# Patient Record
Sex: Male | Born: 1970 | Race: Black or African American | Hispanic: No | Marital: Married | Smoking: Former smoker
Health system: Southern US, Community
[De-identification: ages and names within clinical notes are randomized; demographics above are authoritative.]

## PROBLEM LIST (undated history)

## (undated) DIAGNOSIS — J439 Emphysema, unspecified: Secondary | ICD-10-CM

## (undated) DIAGNOSIS — Z720 Tobacco use: Secondary | ICD-10-CM

## (undated) DIAGNOSIS — K219 Gastro-esophageal reflux disease without esophagitis: Secondary | ICD-10-CM

## (undated) DIAGNOSIS — N529 Male erectile dysfunction, unspecified: Secondary | ICD-10-CM

## (undated) DIAGNOSIS — F99 Mental disorder, not otherwise specified: Secondary | ICD-10-CM

## (undated) DIAGNOSIS — M2550 Pain in unspecified joint: Secondary | ICD-10-CM

## (undated) DIAGNOSIS — R7989 Other specified abnormal findings of blood chemistry: Secondary | ICD-10-CM

## (undated) DIAGNOSIS — J984 Other disorders of lung: Secondary | ICD-10-CM

## (undated) DIAGNOSIS — Z0000 Encounter for general adult medical examination without abnormal findings: Secondary | ICD-10-CM

## (undated) DIAGNOSIS — D571 Sickle-cell disease without crisis: Secondary | ICD-10-CM

## (undated) DIAGNOSIS — M87052 Idiopathic aseptic necrosis of left femur: Secondary | ICD-10-CM

## (undated) DIAGNOSIS — M1990 Unspecified osteoarthritis, unspecified site: Secondary | ICD-10-CM

## (undated) HISTORY — DX: Other specified abnormal findings of blood chemistry: R79.89

## (undated) HISTORY — DX: Idiopathic aseptic necrosis of left femur: M87.052

## (undated) HISTORY — DX: Pain in unspecified joint: M25.50

## (undated) HISTORY — DX: Gastro-esophageal reflux disease without esophagitis: K21.9

## (undated) HISTORY — DX: Unspecified osteoarthritis, unspecified site: M19.90

## (undated) HISTORY — DX: Male erectile dysfunction, unspecified: N52.9

## (undated) HISTORY — DX: Other disorders of lung: J98.4

## (undated) HISTORY — DX: Tobacco use: Z72.0

## (undated) HISTORY — DX: Emphysema, unspecified: J43.9

## (undated) HISTORY — DX: Encounter for general adult medical examination without abnormal findings: Z00.00

## (undated) HISTORY — DX: Sickle-cell disease without crisis: D57.1

## (undated) HISTORY — DX: Mental disorder, not otherwise specified: F99

---

## 1998-11-16 ENCOUNTER — Encounter: Payer: Self-pay | Admitting: Emergency Medicine

## 1998-11-16 ENCOUNTER — Emergency Department (HOSPITAL_COMMUNITY): Admission: EM | Admit: 1998-11-16 | Discharge: 1998-11-16 | Payer: Self-pay | Admitting: Emergency Medicine

## 2002-08-15 ENCOUNTER — Encounter: Payer: Self-pay | Admitting: Family Medicine

## 2002-08-15 ENCOUNTER — Encounter: Admission: RE | Admit: 2002-08-15 | Discharge: 2002-08-15 | Payer: Self-pay | Admitting: Family Medicine

## 2002-09-17 ENCOUNTER — Encounter (INDEPENDENT_AMBULATORY_CARE_PROVIDER_SITE_OTHER): Payer: Self-pay | Admitting: Cardiology

## 2002-09-17 ENCOUNTER — Ambulatory Visit: Admission: RE | Admit: 2002-09-17 | Discharge: 2002-09-17 | Payer: Self-pay | Admitting: Family Medicine

## 2002-11-22 ENCOUNTER — Encounter: Payer: Self-pay | Admitting: Family Medicine

## 2002-11-22 ENCOUNTER — Encounter: Admission: RE | Admit: 2002-11-22 | Discharge: 2002-11-22 | Payer: Self-pay | Admitting: Family Medicine

## 2002-12-27 ENCOUNTER — Ambulatory Visit (HOSPITAL_COMMUNITY): Admission: RE | Admit: 2002-12-27 | Discharge: 2002-12-27 | Payer: Self-pay | Admitting: Oncology

## 2002-12-27 ENCOUNTER — Encounter: Payer: Self-pay | Admitting: Oncology

## 2004-09-30 ENCOUNTER — Ambulatory Visit: Payer: Self-pay | Admitting: Oncology

## 2005-01-15 ENCOUNTER — Emergency Department (HOSPITAL_COMMUNITY): Admission: EM | Admit: 2005-01-15 | Discharge: 2005-01-15 | Payer: Self-pay | Admitting: Emergency Medicine

## 2005-11-23 ENCOUNTER — Encounter: Admission: RE | Admit: 2005-11-23 | Discharge: 2005-11-23 | Payer: Self-pay | Admitting: Family Medicine

## 2007-07-17 ENCOUNTER — Inpatient Hospital Stay (HOSPITAL_COMMUNITY): Admission: EM | Admit: 2007-07-17 | Discharge: 2007-07-26 | Payer: Self-pay | Admitting: Emergency Medicine

## 2007-07-18 ENCOUNTER — Ambulatory Visit: Payer: Self-pay | Admitting: Infectious Diseases

## 2008-08-18 IMAGING — CT CT ABDOMEN W/ CM
1 of 2 series · 14 of 32 positions shown, 18 images · IV contrast (omnipaque)
Comparison: Frontal chest 07/19/2007. No prior CTs.

CLINICAL DATA: right-sided shoulder and chest pain. Shortness of breath.
Abdominal pain. Fever. Sickle cell trait.
TECHNIQUE: Multidetector CT imaging of the chest, abdomen, and pelvis was
performed following the standard protocol during bolus administration of
intravenous contrast.

Contrast:  100 cc Omnipaque 300
CHEST CT WITH CONTRAST

[Series 2: cap 5.0 b40f st · axial · 0.67mm/px · z∈[-537,-7]mm · 14 of 116 slices shown, 18 images]
[im 5/116  soft-tissue]
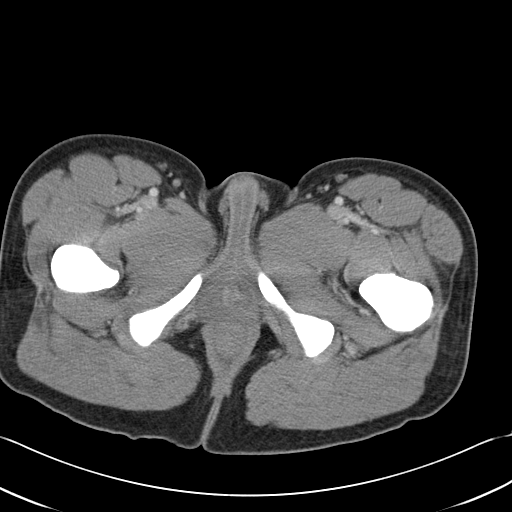
[im 5/116  bone]
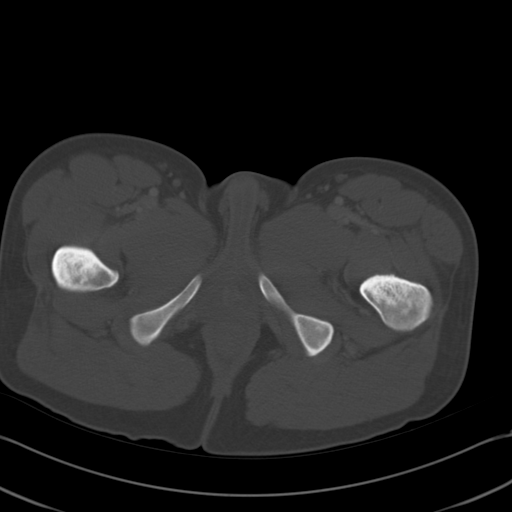
[im 15/116  soft-tissue]
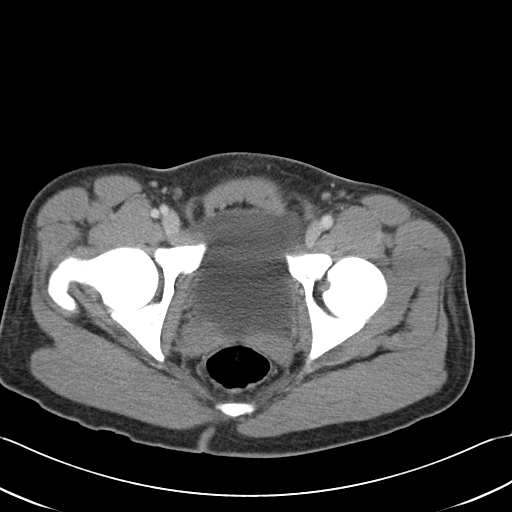
[im 24/116  soft-tissue]
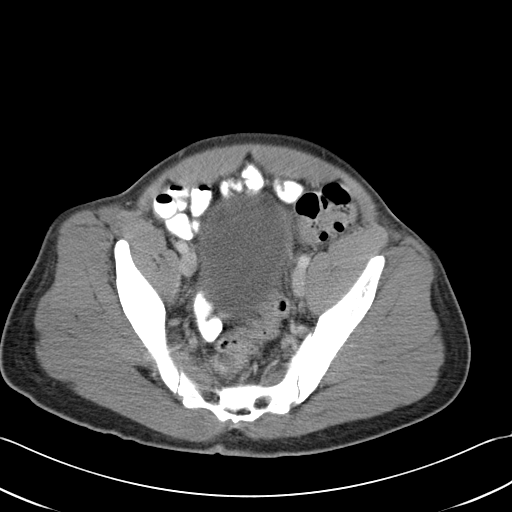
[im 34/116  soft-tissue]
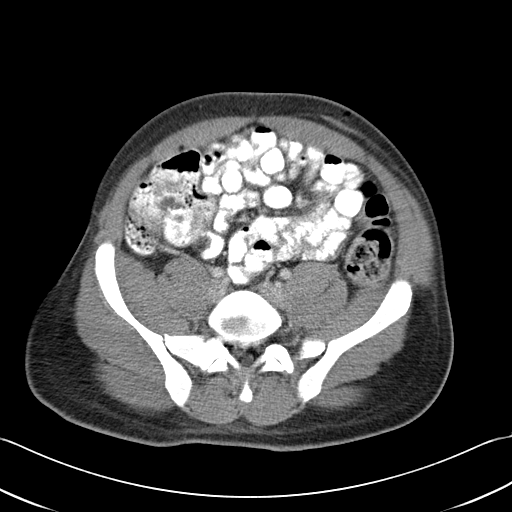
[im 44/116  soft-tissue]
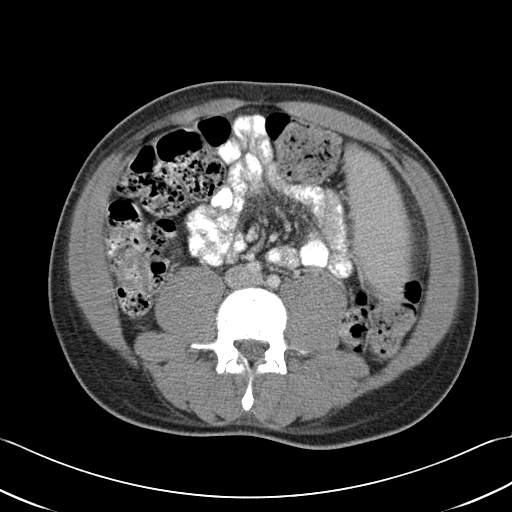
[im 53/116  soft-tissue]
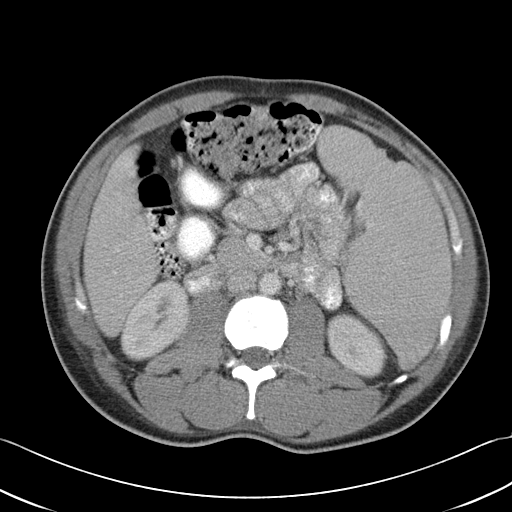
[im 63/116  soft-tissue]
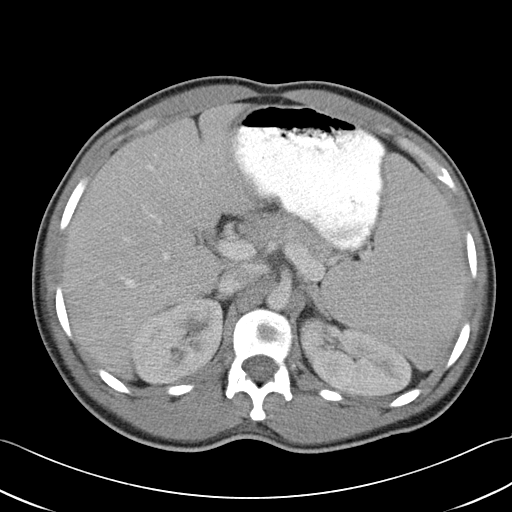
[im 72/116  soft-tissue]
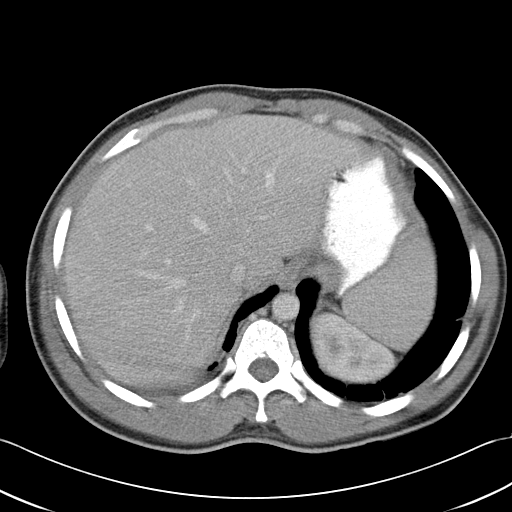
[im 82/116  soft-tissue]
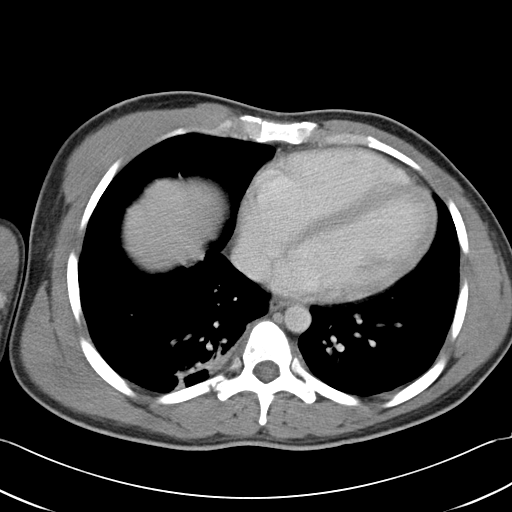
[im 82/116  bone]
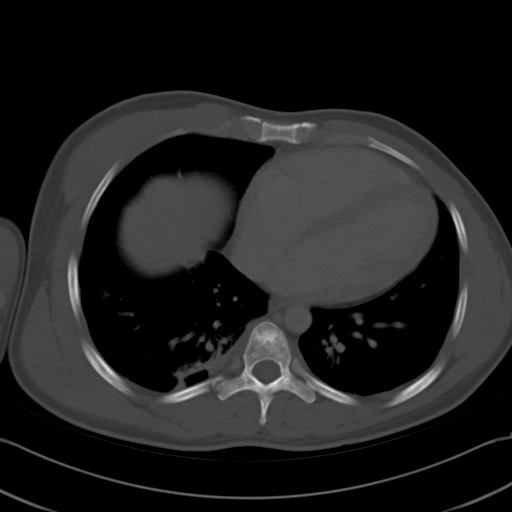
[im 92/116  soft-tissue]
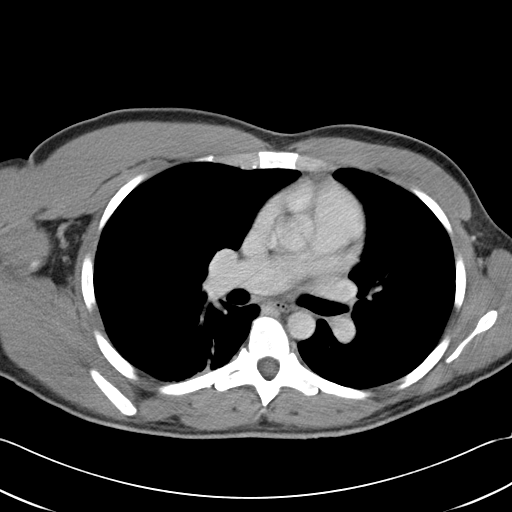
[im 96/116  lung]
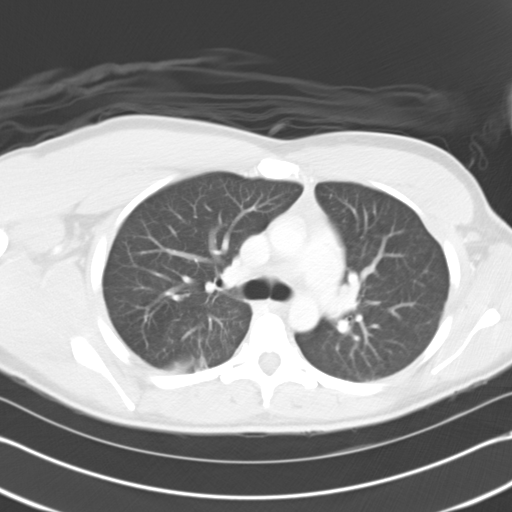
[im 101/116  soft-tissue]
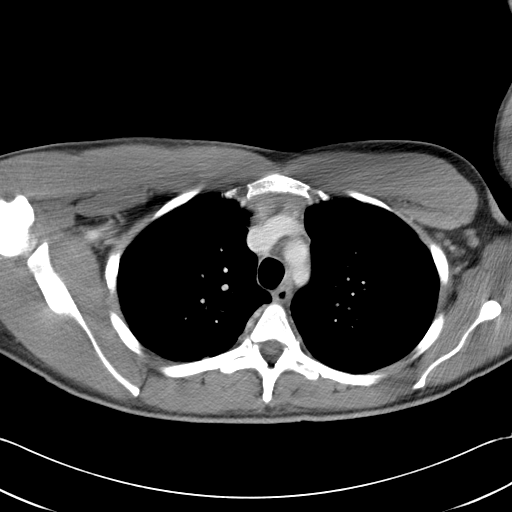
[im 101/116  lung]
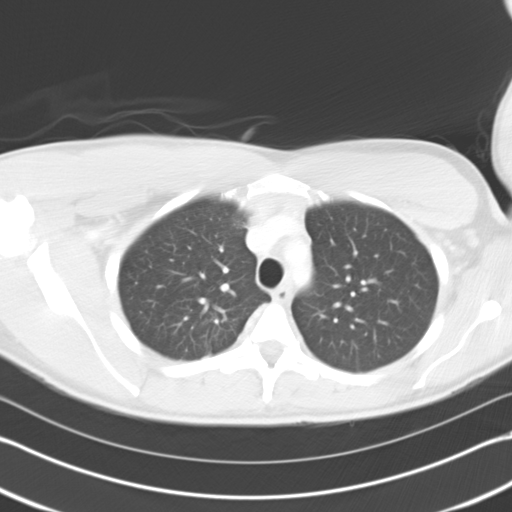
[im 106/116  lung]
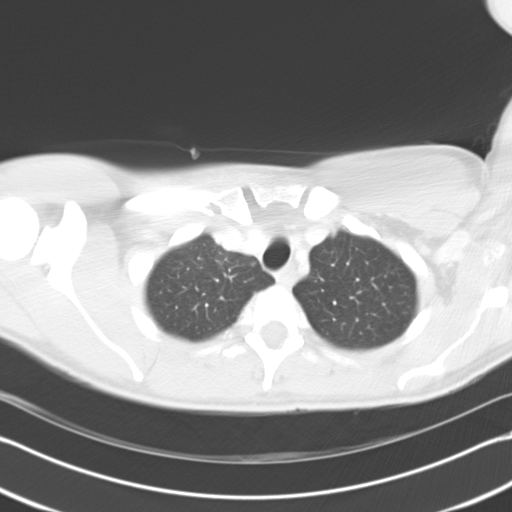
[im 111/116  soft-tissue]
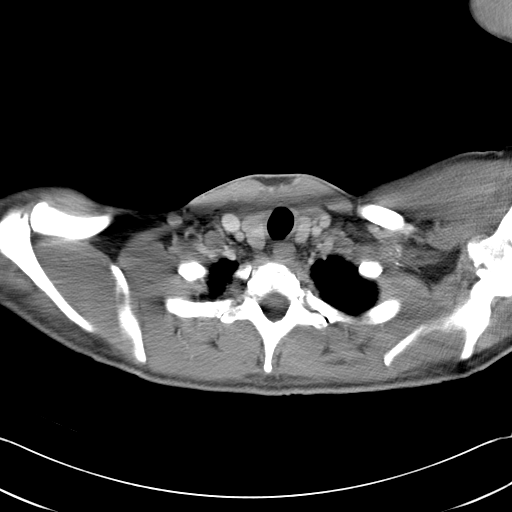
[im 111/116  lung]
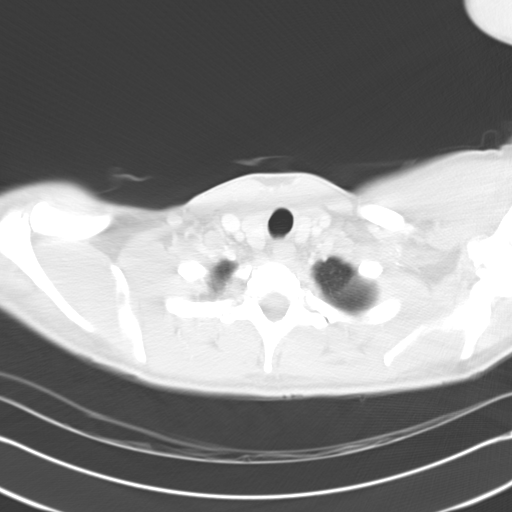

[14 of 32 positions shown; findings below may reference images not displayed]

FINDINGS: Lung windows demonstrate mild airspace disease in dependent right
lower lobe on image 41 most likely related to atelectasis.

Minimal left base atelectasis. 

Soft tissue windows demonstrate that the study was not performed to evaluate for
pulmonary embolism. No gross central embolism is identified.

Right arm is not raised above the head. Mild motion artifact involves the mid
chest. Moderate cardiomegaly without pericardial effusion. Small right-sided
pleural effusion. No mediastinal adenopathy. Mildly prominent right hilar nodal
tissue on image 29 without adenopathy.

Bone windows demonstrate age advanced joint space narrowing involving the left
glenohumeral joint This likely related to either remote trauma or possibly the
sequela of avascular necrosis. Multifocal areas of thoracic spine sclerosis are
suspicious for bone infarcts. There is probable avascular necrosis of the right
humeral head is well.

IMPRESSION

1. Mild right base airspace disease with small right-sided pleural effusion. The
airspace disease is likely related to atelectasis.
2. Moderate cardiomegaly.
3. Osseous sclerosis throughout the thorax most likely related to multiple
infarcts. Please see discussion above.

ABDOMEN CT WITH CONTRAST
FINDINGS: Hepatomegaly 18.4 cm. Massive splenomegaly at 15.6 x 16.1 x 6.9 cm.

Normal stomach, pancreas, gallbladder, adrenal glands, and right kidney. Left
upper pole renal lesion, a cyst.

No retroperitoneal or retrocrural adenopathy.

Moderate stool throughout the colon suggest constipation. Normal terminal ileum.
Appendix not well seen. No right lower quadrant inflammation. No ascites

IMPRESSION

1. Mild hepatomegaly and marked splenomegaly.
2. Otherwise no acute abdominal process.
3. Probable constipation.

PELVIS CT WITH CONTRAST
FINDINGS: No free pelvic fluid. Pelvic loops normal. No adenopathy. Urinary
bladder prostate normal. Bone windows demonstrate extensive sclerosis consistent
with multifocal infarcts. Avascular necrosis of bilateral femoral heads.

IMPRESSION

1. No acute pelvic process.
2.  Extensive bony sclerosis about the pelvis and spine. This is presumed
related related to multifocal infarctions in this patient with sickle cell.
Correlate with any history of malignancy, as concurrent sclerotic metastasis
would be difficult to exclude.

## 2010-02-18 DIAGNOSIS — Z0000 Encounter for general adult medical examination without abnormal findings: Secondary | ICD-10-CM

## 2010-02-18 HISTORY — DX: Encounter for general adult medical examination without abnormal findings: Z00.00

## 2010-08-09 ENCOUNTER — Ambulatory Visit (HOSPITAL_COMMUNITY): Admission: RE | Admit: 2010-08-09 | Discharge: 2010-08-09 | Payer: Self-pay | Admitting: Internal Medicine

## 2010-11-18 HISTORY — PX: UPPER GASTROINTESTINAL ENDOSCOPY: SHX188

## 2011-01-31 HISTORY — PX: ELECTROCARDIOGRAM: SHX264

## 2011-03-02 ENCOUNTER — Encounter: Payer: Self-pay | Admitting: Internal Medicine

## 2011-03-02 DIAGNOSIS — K2990 Gastroduodenitis, unspecified, without bleeding: Secondary | ICD-10-CM | POA: Insufficient documentation

## 2011-03-02 DIAGNOSIS — N529 Male erectile dysfunction, unspecified: Secondary | ICD-10-CM | POA: Insufficient documentation

## 2011-03-02 DIAGNOSIS — M1990 Unspecified osteoarthritis, unspecified site: Secondary | ICD-10-CM | POA: Insufficient documentation

## 2011-03-02 DIAGNOSIS — M2550 Pain in unspecified joint: Secondary | ICD-10-CM | POA: Insufficient documentation

## 2011-04-12 NOTE — Discharge Summary (Signed)
[name], [name]            ACCOUNT NO.:  [id number]   MEDICAL RECORD NO.:  [id number]          PATIENT TYPE:  INP   LOCATION:  1329                         FACILITY:  [facility]   PHYSICIAN:  Beckey Rutter, MD  DATE OF BIRTH:  August 11, 1971   DATE OF ADMISSION:  07/16/2007  DATE OF DISCHARGE:                               DISCHARGE SUMMARY   INTERIM DISCHARGE SUMMARY:   PRIMARY CARE PHYSICIAN:  Eric L. August Saucer, M.D.   CHIEF COMPLAINT ON PRESENTATION:  Pain.   HISTORY OF PRESENT ILLNESS:  Please refer to the H&P dictated on the day  of admission by Thomasenia Bottoms, MD.   HOSPITAL COURSE:  During hospitalization the patient received pain  control for his sickle cell pain crisis.  The patient was developing  fever and because he patient has recent history of travel to [place], the  patient was seen and evaluated by infectious disease for the possibility  of malaria or other infectious pathogen.  The patient's malaria film was  negative and it was felt that his fever is just secondary to his sickle  cell crisis.   Sickle cell problem:  The patient has apparently sequestration crisis  with huge spleen.  The patient received blood transfusion more than  three times but now his hemoglobin is stable.  We will continue to  monitor his H&H for the days to come.  The patient was seen by Dr. Willey Blade during hospitalization for sickle cell management.   DISCHARGE DIAGNOSES:  1. 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  2. Fever with negative blood cultures, felt it is secondary to sickle      cell disease.   DISCHARGE MEDICATIONS:  This will be dictated on the day of actual  discharge.   PLAN:  The plan is to continue to monitor his H&H and   Dictation ended at this point.      Beckey Rutter, MD  Electronically Signed     EME/MEDQ  D:  07/24/2007  T:  07/25/2007  Job:  [id number]

## 2011-04-12 NOTE — H&P (Signed)
NAMEVENUS, [name]            ACCOUNT NO.:  [id number]   MEDICAL RECORD NO.:  [id number]          PATIENT TYPE:  EMS   LOCATION:  ED                           [facility]ACILITY[facility]  [facility]   PHYSICIAN:  Thomasenia Bottoms, MDDATE OF BIRTH:  December 13, 1970   DATE OF ADMISSION:  07/16/2007  DATE OF DISCHARGE:                              HISTORY & PHYSICAL   CHIEF COMPLAINT:  Pain.   HISTORY OF PRESENT ILLNESS:  [name] is a 40 year old with sickle  cell anemia who presents today with pain in his lower back and abdomen.  The patient traveled over seas.  He got back yesterday.  The pain  started earlier this morning when he woke up at about 11:00 a.m.  He  says he has pain in this region when he has his crises, but this is just  worse than usual.  Generally the patient can take outpatient medications  and will be fine after about a week or so.  This pain was so severe he  had to come to the emergency department.  He generally has mild pain  crises.  The last time he had a very severe crisis like this was over 5  years ago.   PAST MEDICAL HISTORY:  [place] sickle cell disease.  No other medical  problems.   PAST SURGICAL HISTORY:  No history of surgeries.   MEDICATIONS ON ARRIVAL:  Just p.r.n. pain medication.   SOCIAL HISTORY:  He does smoke occasionally.  He does drink  occasionally.   FAMILY HISTORY:  Significant for no history of cancer or sickle cell  anemia in other family members that he is aware of.   REVIEW OF SYSTEMS:  CONSTITUTIONAL:  The patient denies any significant  headaches.  No fevers.  No night sweats.  HEENT:  No double vision.  No  sinus trouble.  No sore throat.  No headaches.  CARDIOVASCULAR:  No  chest pain.  No lower extremity edema.  RESPIRATORY:  No hemoptysis.  No  cough.  No shortness of breath.  GI:  He is having the pain but no  diarrhea.  No nausea, no vomiting.  No bright red blood per rectum.  He  has not vomited any blood.  GU:  No hematuria.   MUSCULOSKELETAL:  He is  having pain in his muscles, mostly in his abdomen area and back, as  mentioned above.  NEUROLOGIC:  No asymmetric weakness or paresthesias.  The patient is ambulatory and independent of his activities of daily  living.  No slurred speech.  No seizures.  INTEGUMENTARY:  No rashes or  open lesions.   PHYSICAL EXAMINATION:  VITAL SIGNS:  On arrival, blood pressure 147/82,  pulse 91, respiratory rate 22, saturating 100% on room air.  GENERAL:  The patient is a young gentleman, well-nourished, well-  developed.  HEENT:  Normocephalic, atraumatic.  His pupils are equal and round.  The  sclera are nonicteric.  Oral mucosa moist.  Oropharynx within normal  limits.  NECK:  Supple.  No lymphadenopathy, no thyromegaly, no jugular venous  distension.  CARDIAC:  He is somewhat tachycardiac but regular.  LUNGS:  Clear to auscultation bilaterally.  No wheezes, rhonchi, or  rales.  ABDOMEN:  Soft.  He seems to be guarding somewhat, but he does have  bowel sounds.  He denies tenderness even to deep palpation.  I am unable  to appreciate any masses.  EXTREMITIES:  No evidence of clubbing, cyanosis, or edema.  NEUROLOGIC:  He is alert and oriented.  He is slightly drowsy, likely  from he pain medication he just received, but he does answer all my  questions appropriately and does follow commands.  He has 5/5 strength  in each of his extremities with no focal motor deficit.  SKIN:  Intact  grossly, though sacral area was not examined.   LABORATORY DATA:  White count of 9.8, hemoglobin 9.8, hematocrit 28.6,  platelet count 197.  Urinalysis is negative for leukocyte esterase and  essentially unremarkable.  Sodium is 135, potassium 3.7, chloride 101,  bicarb 26, glucose 149, BUN 10, creatinine 0.97.  AST is 41, ALT is 20.  Total bilirubin is not reported on the sheet.   Chest x-ray reveals borderline cardiomegaly with mild bronchitic  changes.  His EKG reveals normal sinus  rhythm with a rate of 92.  He has  some nonspecific T-wave abnormalities.   ASSESSMENT AND PLAN:  Young gentleman with a sickle cell crisis.  Will  admit him to the hospital, put him on IV fluids, and provide pain  medicine.  Certainly if his pain worsens, would consider doing  additional studies to rule out other causes.  The patient's abdominal  exam is relatively benign at this point.  His laboratory tests are  relatively unremarkable, and he does report that this is similar to his  crises in the past, so we will monitor, with routine sickle cell  admission orders, including IV fluids and pain medication.      Thomasenia Bottoms, MD  Electronically Signed     CVC/MEDQ  D:  07/17/2007  T:  07/17/2007  Job:  [id number]   cc:   Renaye Rakers, M.D.  Fax: [phone]

## 2011-04-15 NOTE — Discharge Summary (Signed)
NAMEJEREMI, [name]            ACCOUNT NO.:  [id number]   MEDICAL RECORD NO.:  [id number]          PATIENT TYPE:  INP   LOCATION:  1329                         FACILITY:  [facility]   PHYSICIAN:  Beckey Rutter, MD  DATE OF BIRTH:  July 11, 1971   DATE OF ADMISSION:  07/16/2007  DATE OF DISCHARGE:  07/26/2007                               DISCHARGE SUMMARY   ADDENDUM:   DISCHARGE MEDICATIONS:  1. Folic acid daily.  2. Tylenol p.r.n.   The patient was planned to follow up with Dr. August Saucer as discussed and  documented on in the chart for discharge.      Beckey Rutter, MD  Electronically Signed     EME/MEDQ  D:  08/21/2007  T:  08/21/2007  Job:  [id number]

## 2011-09-09 LAB — CBC
HCT: 22.3 — ABNORMAL LOW
HCT: 22.8 — ABNORMAL LOW
HCT: 25.4 — ABNORMAL LOW
HCT: 27.8 — ABNORMAL LOW
HCT: 28.6 — ABNORMAL LOW
HCT: 28.8 — ABNORMAL LOW
HCT: 29.4 — ABNORMAL LOW
Hemoglobin: 6.7 — CL
Hemoglobin: 7.8 — CL
Hemoglobin: 9.7 — ABNORMAL LOW
MCHC: 33.6
MCHC: 33.8
MCHC: 34.3
MCHC: 34.4
MCHC: 34.4
MCV: 79.5
MCV: 80.7
MCV: 83.2
MCV: 83.3
MCV: 84.8
MCV: 84.9
Platelets: 113 — ABNORMAL LOW
Platelets: 197
Platelets: 46 — CL
Platelets: 59 — ABNORMAL LOW
Platelets: 68 — ABNORMAL LOW
Platelets: 97 — ABNORMAL LOW
RBC: 2.47 — ABNORMAL LOW
RBC: 2.68 — ABNORMAL LOW
RBC: 2.87 — ABNORMAL LOW
RBC: 3.27 — ABNORMAL LOW
RBC: 3.39 — ABNORMAL LOW
RBC: 3.53 — ABNORMAL LOW
RDW: 18.1 — ABNORMAL HIGH
RDW: 19.1 — ABNORMAL HIGH
RDW: 19.8 — ABNORMAL HIGH
RDW: 20.2 — ABNORMAL HIGH
RDW: 21.8 — ABNORMAL HIGH
WBC: 11 — ABNORMAL HIGH
WBC: 17.7 — ABNORMAL HIGH
WBC: 5.5
WBC: 5.9
WBC: 7.7
WBC: 8
WBC: 9.8

## 2011-09-09 LAB — COMPREHENSIVE METABOLIC PANEL
ALT: 19
ALT: 20
ALT: 20
ALT: 21
ALT: 23
ALT: 29
AST: 128 — ABNORMAL HIGH
AST: 19
AST: 22
AST: 27
AST: 35
AST: 35
AST: 41 — ABNORMAL HIGH
AST: 58 — ABNORMAL HIGH
AST: 66 — ABNORMAL HIGH
Albumin: 2.6 — ABNORMAL LOW
Albumin: 2.6 — ABNORMAL LOW
Albumin: 2.8 — ABNORMAL LOW
Albumin: 3.5
Albumin: 4.3
Alkaline Phosphatase: 132 — ABNORMAL HIGH
Alkaline Phosphatase: 165 — ABNORMAL HIGH
Alkaline Phosphatase: 173 — ABNORMAL HIGH
Alkaline Phosphatase: 237 — ABNORMAL HIGH
Alkaline Phosphatase: 86
BUN: 10
BUN: 11
BUN: 8
BUN: 8
BUN: 8
CO2: 24
CO2: 25
CO2: 25
CO2: 26
CO2: 27
Calcium: 8.5
Calcium: 8.8
Calcium: 8.9
Calcium: 8.9
Chloride: 101
Chloride: 101
Chloride: 102
Chloride: 103
Chloride: 104
Chloride: 109
Creatinine, Ser: 0.78
Creatinine, Ser: 0.85
Creatinine, Ser: 0.86
Creatinine, Ser: 0.86
Creatinine, Ser: 1.04
GFR calc Af Amer: >60
GFR calc Af Amer: >60
GFR calc Af Amer: >60
GFR calc Af Amer: >60
GFR calc Af Amer: >60
GFR calc Af Amer: >60
GFR calc Af Amer: >60
GFR calc Af Amer: >60
GFR calc non Af Amer: >60
GFR calc non Af Amer: >60
GFR calc non Af Amer: >60
GFR calc non Af Amer: >60
GFR calc non Af Amer: >60
Glucose, Bld: 112 — ABNORMAL HIGH
Glucose, Bld: 115 — ABNORMAL HIGH
Glucose, Bld: 124 — ABNORMAL HIGH
Glucose, Bld: 146 — ABNORMAL HIGH
Potassium: 3.7
Potassium: 3.7
Potassium: 4
Potassium: 4
Potassium: 4.4
Sodium: 134 — ABNORMAL LOW
Sodium: 135
Sodium: 136
Sodium: 138
Sodium: 139
Total Bilirubin: 1.4 — ABNORMAL HIGH
Total Bilirubin: 1.4 — ABNORMAL HIGH
Total Bilirubin: 1.5 — ABNORMAL HIGH
Total Bilirubin: 1.5 — ABNORMAL HIGH
Total Bilirubin: 1.6 — ABNORMAL HIGH
Total Bilirubin: 1.8 — ABNORMAL HIGH
Total Protein: 6
Total Protein: 6.4
Total Protein: 6.4
Total Protein: 7

## 2011-09-09 LAB — MALARIA SMEAR

## 2011-09-09 LAB — CROSSMATCH: ABO/RH(D): O POS

## 2011-09-09 LAB — DIFFERENTIAL
Basophils Absolute: 0
Basophils Absolute: 0
Basophils Absolute: 0
Basophils Absolute: 0.1
Basophils Relative: 0
Eosinophils Absolute: 0
Eosinophils Absolute: 0.1
Eosinophils Absolute: 0.1
Eosinophils Relative: 0
Eosinophils Relative: 1
Eosinophils Relative: 1
Lymphocytes Relative: 13
Lymphocytes Relative: 18
Lymphocytes Relative: 22
Lymphs Abs: 1.1
Monocytes Absolute: 0.4
Monocytes Absolute: 0.5
Monocytes Relative: 8
Neutrophils Relative %: 69
Neutrophils Relative %: 73

## 2011-09-09 LAB — CULTURE, BLOOD (ROUTINE X 2)
Culture: NO GROWTH
Culture: NO GROWTH
Culture: NO GROWTH

## 2011-09-09 LAB — URINALYSIS, ROUTINE W REFLEX MICROSCOPIC
Bilirubin Urine: NEGATIVE
Glucose, UA: NEGATIVE
Hgb urine dipstick: NEGATIVE
Ketones, ur: NEGATIVE
Leukocytes, UA: NEGATIVE
Nitrite: NEGATIVE
Nitrite: NEGATIVE
Specific Gravity, Urine: 1.007
Urobilinogen, UA: 0.2
pH: 6.5

## 2011-09-09 LAB — HIV ANTIBODY (ROUTINE TESTING W REFLEX): HIV: NONREACTIVE

## 2011-09-09 LAB — URINE MICROSCOPIC-ADD ON

## 2011-09-09 LAB — POCT CARDIAC MARKERS: CKMB, poc: <1 — ABNORMAL LOW

## 2011-09-09 LAB — RETICULOCYTES
RBC.: 3.04 — ABNORMAL LOW
RBC.: 3.4 — ABNORMAL LOW
Retic Count, Absolute: 27.4
Retic Ct Pct: 0.9
Retic Ct Pct: 2.3

## 2011-09-09 LAB — URINE CULTURE: Culture: NO GROWTH

## 2011-09-09 LAB — ABO/RH: ABO/RH(D): O POS

## 2011-09-09 LAB — HEMOGLOBINOPATHY EVALUATION
Hemoglobin Other: 29.7 — ABNORMAL HIGH (ref 0.0–0.0)
Hgb F Quant: 1.4 — ABNORMAL HIGH (ref 0.0–2.0)

## 2012-04-23 ENCOUNTER — Telehealth (HOSPITAL_COMMUNITY): Payer: Self-pay | Admitting: Hematology

## 2012-06-21 ENCOUNTER — Other Ambulatory Visit: Payer: Self-pay | Admitting: Internal Medicine

## 2012-06-21 DIAGNOSIS — R109 Unspecified abdominal pain: Secondary | ICD-10-CM

## 2012-06-21 LAB — HEPATIC FUNCTION PANEL
ALT: 18 U/L (ref 10–40)
AST: 19 U/L (ref 14–40)
Alkaline Phosphatase: 53 U/L (ref 25–125)
Bilirubin, Total: 0.9 mg/dL

## 2012-06-21 LAB — CBC AND DIFFERENTIAL
HCT: 32 % — AB (ref 41–53)
Hemoglobin: 11.6 g/dL — AB (ref 13.5–17.5)
Platelets: 102 10*3/uL — AB (ref 150–399)

## 2012-06-21 LAB — BASIC METABOLIC PANEL: Sodium: 140 mmol/L (ref 137–147)

## 2012-06-22 ENCOUNTER — Other Ambulatory Visit (HOSPITAL_COMMUNITY): Payer: Self-pay

## 2012-06-27 ENCOUNTER — Ambulatory Visit (HOSPITAL_COMMUNITY)
Admission: RE | Admit: 2012-06-27 | Discharge: 2012-06-27 | Disposition: A | Discharge status: Home / Self Care | Payer: BC Managed Care – PPO | Source: Ambulatory Visit | Attending: Internal Medicine | Admitting: Internal Medicine

## 2012-06-27 DIAGNOSIS — R109 Unspecified abdominal pain: Secondary | ICD-10-CM

## 2012-06-27 DIAGNOSIS — K8020 Calculus of gallbladder without cholecystitis without obstruction: Secondary | ICD-10-CM | POA: Insufficient documentation

## 2012-06-27 DIAGNOSIS — R161 Splenomegaly, not elsewhere classified: Secondary | ICD-10-CM | POA: Insufficient documentation

## 2012-10-22 ENCOUNTER — Other Ambulatory Visit: Payer: Self-pay | Admitting: Internal Medicine

## 2012-10-22 ENCOUNTER — Ambulatory Visit
Admission: RE | Admit: 2012-10-22 | Discharge: 2012-10-22 | Disposition: A | Discharge status: Home / Self Care | Payer: BC Managed Care – PPO | Source: Ambulatory Visit | Attending: Internal Medicine | Admitting: Internal Medicine

## 2012-10-22 DIAGNOSIS — R942 Abnormal results of pulmonary function studies: Secondary | ICD-10-CM

## 2012-12-13 ENCOUNTER — Encounter: Payer: Self-pay | Admitting: Hematology

## 2012-12-13 DIAGNOSIS — J984 Other disorders of lung: Secondary | ICD-10-CM | POA: Insufficient documentation

## 2012-12-13 DIAGNOSIS — R7989 Other specified abnormal findings of blood chemistry: Secondary | ICD-10-CM | POA: Insufficient documentation

## 2012-12-13 DIAGNOSIS — N529 Male erectile dysfunction, unspecified: Secondary | ICD-10-CM | POA: Insufficient documentation

## 2012-12-13 DIAGNOSIS — M87052 Idiopathic aseptic necrosis of left femur: Secondary | ICD-10-CM | POA: Insufficient documentation

## 2012-12-20 ENCOUNTER — Encounter (HOSPITAL_COMMUNITY): Payer: Self-pay | Admitting: *Deleted

## 2012-12-20 ENCOUNTER — Emergency Department (HOSPITAL_COMMUNITY)
Admission: EM | Admit: 2012-12-20 | Discharge: 2012-12-21 | Disposition: A | Discharge status: Home / Self Care | Payer: BC Managed Care – PPO | Attending: Emergency Medicine | Admitting: Emergency Medicine

## 2012-12-20 DIAGNOSIS — Z8739 Personal history of other diseases of the musculoskeletal system and connective tissue: Secondary | ICD-10-CM | POA: Insufficient documentation

## 2012-12-20 DIAGNOSIS — M1990 Unspecified osteoarthritis, unspecified site: Secondary | ICD-10-CM | POA: Insufficient documentation

## 2012-12-20 DIAGNOSIS — Z8639 Personal history of other endocrine, nutritional and metabolic disease: Secondary | ICD-10-CM | POA: Insufficient documentation

## 2012-12-20 DIAGNOSIS — Z8709 Personal history of other diseases of the respiratory system: Secondary | ICD-10-CM | POA: Insufficient documentation

## 2012-12-20 DIAGNOSIS — Z8719 Personal history of other diseases of the digestive system: Secondary | ICD-10-CM | POA: Insufficient documentation

## 2012-12-20 DIAGNOSIS — M79606 Pain in leg, unspecified: Secondary | ICD-10-CM

## 2012-12-20 DIAGNOSIS — F17200 Nicotine dependence, unspecified, uncomplicated: Secondary | ICD-10-CM | POA: Insufficient documentation

## 2012-12-20 DIAGNOSIS — M79609 Pain in unspecified limb: Secondary | ICD-10-CM | POA: Insufficient documentation

## 2012-12-20 DIAGNOSIS — Z862 Personal history of diseases of the blood and blood-forming organs and certain disorders involving the immune mechanism: Secondary | ICD-10-CM | POA: Insufficient documentation

## 2012-12-20 DIAGNOSIS — Z8659 Personal history of other mental and behavioral disorders: Secondary | ICD-10-CM | POA: Insufficient documentation

## 2012-12-20 DIAGNOSIS — D5700 Hb-SS disease with crisis, unspecified: Secondary | ICD-10-CM | POA: Insufficient documentation

## 2012-12-20 DIAGNOSIS — Z87898 Personal history of other specified conditions: Secondary | ICD-10-CM | POA: Insufficient documentation

## 2012-12-20 NOTE — ED Notes (Signed)
Pt c/o sickle cell crisis; leg pain

## 2012-12-21 ENCOUNTER — Emergency Department (HOSPITAL_COMMUNITY): Payer: BC Managed Care – PPO

## 2012-12-21 LAB — CBC WITH DIFFERENTIAL/PLATELET
Basophils Absolute: 0 10*3/uL (ref 0.0–0.1)
Eosinophils Absolute: 0 10*3/uL (ref 0.0–0.7)
HCT: 32.5 % — ABNORMAL LOW (ref 39.0–52.0)
Lymphocytes Relative: 13 % (ref 12–46)
MCHC: 36 g/dL (ref 30.0–36.0)
Neutro Abs: 5.6 10*3/uL (ref 1.7–7.7)
Neutrophils Relative %: 82 % — ABNORMAL HIGH (ref 43–77)
Platelets: 96 10*3/uL — ABNORMAL LOW (ref 150–400)
RDW: 15.1 % (ref 11.5–15.5)
WBC: 6.8 10*3/uL (ref 4.0–10.5)

## 2012-12-21 LAB — RETICULOCYTES: Retic Ct Pct: 4.6 % — ABNORMAL HIGH (ref 0.4–3.1)

## 2012-12-21 LAB — COMPREHENSIVE METABOLIC PANEL
Alkaline Phosphatase: 55 U/L (ref 39–117)
BUN: 12 mg/dL (ref 6–23)
Creatinine, Ser: 0.94 mg/dL (ref 0.50–1.35)
GFR calc Af Amer: >90 mL/min (ref >90)
Glucose, Bld: 93 mg/dL (ref 70–99)
Potassium: 3.5 mEq/L (ref 3.5–5.1)
Total Bilirubin: 1.2 mg/dL (ref 0.3–1.2)
Total Protein: 7.4 g/dL (ref 6.0–8.3)

## 2012-12-21 MED ORDER — DEXTROSE-NACL 5-0.45 % IV SOLN
INTRAVENOUS | Status: DC
  Administered 2012-12-21: 01:00:00 via INTRAVENOUS

## 2012-12-21 MED ORDER — DIPHENHYDRAMINE HCL 25 MG PO CAPS
25.0000 mg | ORAL_CAPSULE | Freq: Once | ORAL | Status: AC
  Administered 2012-12-21: 25 mg via ORAL
  Filled 2012-12-21: qty 1

## 2012-12-21 MED ORDER — ONDANSETRON HCL 4 MG/2ML IJ SOLN
4.0000 mg | Freq: Once | INTRAMUSCULAR | Status: AC
  Administered 2012-12-21: 4 mg via INTRAVENOUS
  Filled 2012-12-21: qty 2

## 2012-12-21 MED ORDER — HYDROMORPHONE HCL PF 1 MG/ML IJ SOLN
1.0000 mg | Freq: Once | INTRAMUSCULAR | Status: AC
  Administered 2012-12-21: 1 mg via INTRAVENOUS
  Filled 2012-12-21: qty 1

## 2012-12-21 MED ORDER — FENTANYL CITRATE 0.05 MG/ML IJ SOLN
50.0000 ug | Freq: Once | INTRAMUSCULAR | Status: DC
  Filled 2012-12-21: qty 2

## 2012-12-21 MED ORDER — OXYCODONE-ACETAMINOPHEN 5-325 MG PO TABS: 2.0000 | ORAL_TABLET | ORAL | Status: DC | PRN

## 2012-12-21 NOTE — ED Provider Notes (Signed)
History     CSN: [id number]  Arrival date & time 12/20/12  2255   First MD Initiated Contact with Patient 12/21/12 0020      Chief Complaint  Patient presents with  . Sickle Cell Pain Crisis    (Consider location/radiation/quality/duration/timing/severity/associated sxs/prior treatment) HPIEmmanuel Kirsch is a 42 y.o. male with a past medical history significant for sickle cell anemia presenting with sickle cell crisis, he is having sickle cell crisis with 10 out of 10 pain in the lower extremities right leg worse than left leg, is in the long bones, knees and calves.  It has not responded to ibuprofen at home, patient says he is out of his Percocet at home. Patient sees Dr. August Saucer. He denies any fevers, chills, cough, chest pain, shortness of breath, dizziness or lightheadedness. No pain in the upper extremities, no rash, no abdominal pain nausea vomiting  Past Medical History  Diagnosis Date  . Gastritis and duodenitis   . Pain in joint, site unspecified   . Physical exam, annual 02/18/2010  . GERD (gastroesophageal reflux disease)   . Sickle cell anemia   . Avascular necrosis of left femoral head   . Osteoarthrosis, unspecified whether generalized or localized, unspecified site   . Mental disorder   . Erectile dysfunction   . Low testosterone   . Tobacco use   . Mixed restrictive and obstructive lung disease     Past Surgical History  Procedure Date  . Electrocardiogram 01/31/2011  . Upper gastrointestinal endoscopy 11/18/2010    No family history on file.  History  Substance Use Topics  . Smoking status: Current Every Day Smoker  . Smokeless tobacco: Not on file  . Alcohol Use: Not on file      Review of Systems At least 10pt or greater review of systems completed and are negative except where specified in the HPI.  Allergies  Review of patient's allergies indicates no known allergies.  Home Medications   Current Outpatient Rx  Name  Route  Sig  Dispense   Refill  . CALCIUM CARBONATE ANTACID 500 MG PO CHEW   Oral   Chew 1 tablet by mouth 3 (three) times daily as needed. For heartburn         . IBUPROFEN 800 MG PO TABS   Oral   Take 800 mg by mouth 2 (two) times daily as needed. For pain         . ADULT MULTIVITAMIN W/MINERALS CH   Oral   Take 1 tablet by mouth every morning.         . OXYCODONE-ACETAMINOPHEN 7.5-325 MG PO TABS   Oral   Take 1 tablet by mouth every 6 (six) hours as needed. For pain           BP 130/65  Pulse 67  Temp 99.1 F (37.3 C) (Oral)  Resp 18  SpO2 99%  Physical Exam  Nursing notes reviewed.  Electronic medical record reviewed. VITAL SIGNS:   Filed Vitals:   12/20/12 2305 12/21/12 0302 12/21/12 0548  BP: 127/73 130/65 124/68  Pulse: 90 67 85  Temp: 99.5 F (37.5 C) 99.1 F (37.3 C)   TempSrc:  Oral   Resp: 36 18 16  SpO2: 100% 99% 100%   CONSTITUTIONAL: Awake, oriented, appears non-toxic HENT: Atraumatic, normocephalic, oral mucosa pink and moist, airway patent. Nares patent without drainage. External ears normal. EYES: Conjunctiva clear, EOMI, PERRLA NECK: Trachea midline, non-tender, supple CARDIOVASCULAR: Normal heart rate, Normal rhythm, No murmurs, rubs,  gallops PULMONARY/CHEST: Clear to auscultation, no rhonchi, wheezes, or rales. Symmetrical breath sounds. Non-tender. ABDOMINAL: Non-distended, soft, non-tender - no rebound or guarding.  BS normal. NEUROLOGIC: Non-focal, moving all four extremities, no gross sensory or motor deficits. EXTREMITIES: No clubbing, cyanosis, or edema SKIN: Warm, Dry, No erythema, No rash  ED Course  Procedures (including critical care time)  Labs Reviewed  CBC WITH DIFFERENTIAL - Abnormal; Notable for the following:    RBC 4.01 (*)     Hemoglobin 11.7 (*)     HCT 32.5 (*)     Platelets 96 (*)     Neutrophils Relative 82 (*)     All other components within normal limits  RETICULOCYTES - Abnormal; Notable for the following:    Retic Ct Pct  4.6 (*)     RBC. 4.01 (*)     All other components within normal limits  COMPREHENSIVE METABOLIC PANEL   Dg Chest Portable 1 View  12/21/2012  *RADIOLOGY REPORT*  Clinical Data: Sickle cell crisis.  PORTABLE CHEST - 1 VIEW  Comparison: 10/22/2012  Findings: Single view of the chest demonstrates clear lungs. Stable appearance of the heart and mediastinum.  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 There appears to be sclerosis in the right humeral head.  IMPRESSION: No acute cardiopulmonary disease.  Chronic deformity of the left shoulder.   Original Report Authenticated By: Richarda Overlie, M.D.     1. Sickle cell crisis   2. Leg pain    MDM  Chuong Casebeer is a 42 y.o. male presenting with sickle cell crisis, no history of physical points that make me think the patient suffered from acute chest syndrome. Patient's having leg pain, no swelling in the legs, history of DVT, do not think the patient Scott thromboembolic disease-I think this is likely just a sickle cell crisis pain.  Labs returned with a normal hemoglobin of 11.7 hematocrit 32.5 (for him), platelet count is 96,000-these are typically slightly low on this patient, reticulocyte count is adequate at 4.6 indicating an adequate bone marrow response. CMP is unremarkable. Chest x-ray is unremarkable.  Pain was managed with subsequent doses of Dilaudid, patient got to a point where he felt like he go home where his pain was adequately managed. Patient has a followup appointment at 11 AM later today with Dr. Liz Beach will keep that appointment and get his pain medicine prescribed at that time.  I explained the diagnosis and have given explicit precautions to return to the ER including any other new or worsening symptoms. The patient understands and accepts the medical plan as it's been dictated and I have answered their questions. Discharge instructions concerning home care and prescriptions have been  given.  The patient is STABLE and is discharged to home in good condition.      Jones Skene, MD 12/21/12 [phone]

## 2012-12-31 ENCOUNTER — Inpatient Hospital Stay (HOSPITAL_COMMUNITY)
Admission: AD | Admit: 2012-12-31 | Discharge: 2013-01-04 | DRG: 551 | Disposition: A | Discharge status: Home / Self Care | Payer: BC Managed Care – PPO | Attending: Internal Medicine | Admitting: Internal Medicine

## 2012-12-31 DIAGNOSIS — J984 Other disorders of lung: Secondary | ICD-10-CM

## 2012-12-31 DIAGNOSIS — K219 Gastro-esophageal reflux disease without esophagitis: Secondary | ICD-10-CM | POA: Diagnosis present

## 2012-12-31 DIAGNOSIS — J4489 Other specified chronic obstructive pulmonary disease: Secondary | ICD-10-CM | POA: Diagnosis present

## 2012-12-31 DIAGNOSIS — K2970 Gastritis, unspecified, without bleeding: Secondary | ICD-10-CM | POA: Diagnosis present

## 2012-12-31 DIAGNOSIS — R1013 Epigastric pain: Secondary | ICD-10-CM

## 2012-12-31 DIAGNOSIS — K2980 Duodenitis without bleeding: Principal | ICD-10-CM | POA: Diagnosis present

## 2012-12-31 DIAGNOSIS — R1012 Left upper quadrant pain: Secondary | ICD-10-CM

## 2012-12-31 DIAGNOSIS — N529 Male erectile dysfunction, unspecified: Secondary | ICD-10-CM | POA: Diagnosis present

## 2012-12-31 DIAGNOSIS — R7989 Other specified abnormal findings of blood chemistry: Secondary | ICD-10-CM

## 2012-12-31 DIAGNOSIS — R161 Splenomegaly, not elsewhere classified: Secondary | ICD-10-CM | POA: Diagnosis present

## 2012-12-31 DIAGNOSIS — D649 Anemia, unspecified: Secondary | ICD-10-CM

## 2012-12-31 DIAGNOSIS — J439 Emphysema, unspecified: Secondary | ICD-10-CM

## 2012-12-31 DIAGNOSIS — M1990 Unspecified osteoarthritis, unspecified site: Secondary | ICD-10-CM

## 2012-12-31 DIAGNOSIS — J449 Chronic obstructive pulmonary disease, unspecified: Secondary | ICD-10-CM | POA: Diagnosis present

## 2012-12-31 DIAGNOSIS — Z791 Long term (current) use of non-steroidal anti-inflammatories (NSAID): Secondary | ICD-10-CM

## 2012-12-31 DIAGNOSIS — K2990 Gastroduodenitis, unspecified, without bleeding: Secondary | ICD-10-CM | POA: Diagnosis present

## 2012-12-31 DIAGNOSIS — M87052 Idiopathic aseptic necrosis of left femur: Secondary | ICD-10-CM

## 2012-12-31 DIAGNOSIS — D696 Thrombocytopenia, unspecified: Secondary | ICD-10-CM | POA: Diagnosis present

## 2012-12-31 DIAGNOSIS — R509 Fever, unspecified: Secondary | ICD-10-CM | POA: Diagnosis present

## 2012-12-31 DIAGNOSIS — K449 Diaphragmatic hernia without obstruction or gangrene: Secondary | ICD-10-CM | POA: Diagnosis present

## 2012-12-31 DIAGNOSIS — K8020 Calculus of gallbladder without cholecystitis without obstruction: Secondary | ICD-10-CM | POA: Diagnosis present

## 2012-12-31 DIAGNOSIS — D57219 Sickle-cell/Hb-C disease with crisis, unspecified: Secondary | ICD-10-CM | POA: Diagnosis present

## 2012-12-31 DIAGNOSIS — Z79899 Other long term (current) drug therapy: Secondary | ICD-10-CM

## 2012-12-31 DIAGNOSIS — R5081 Fever presenting with conditions classified elsewhere: Secondary | ICD-10-CM | POA: Diagnosis present

## 2012-12-31 DIAGNOSIS — F489 Nonpsychotic mental disorder, unspecified: Secondary | ICD-10-CM | POA: Diagnosis present

## 2012-12-31 DIAGNOSIS — F17200 Nicotine dependence, unspecified, uncomplicated: Secondary | ICD-10-CM | POA: Diagnosis present

## 2012-12-31 DIAGNOSIS — M2550 Pain in unspecified joint: Secondary | ICD-10-CM

## 2012-12-31 DIAGNOSIS — D638 Anemia in other chronic diseases classified elsewhere: Secondary | ICD-10-CM | POA: Diagnosis present

## 2012-12-31 LAB — COMPREHENSIVE METABOLIC PANEL
AST: 23 U/L (ref 0–37)
Albumin: 4.3 g/dL (ref 3.5–5.2)
Alkaline Phosphatase: 64 U/L (ref 39–117)
BUN: 17 mg/dL (ref 6–23)
CO2: 24 mEq/L (ref 19–32)
Chloride: 101 mEq/L (ref 96–112)
Creatinine, Ser: 0.75 mg/dL (ref 0.50–1.35)
GFR calc non Af Amer: >90 mL/min (ref >90)
Potassium: 3.9 mEq/L (ref 3.5–5.1)
Total Bilirubin: 1 mg/dL (ref 0.3–1.2)

## 2012-12-31 LAB — CBC WITH DIFFERENTIAL/PLATELET
Basophils Absolute: 0 10*3/uL (ref 0.0–0.1)
Basophils Relative: 0 % (ref 0–1)
Eosinophils Absolute: 0 10*3/uL (ref 0.0–0.7)
Eosinophils Relative: 0 % (ref 0–5)
MCH: 28 pg (ref 26.0–34.0)
MCV: 78.6 fL (ref 78.0–100.0)
Neutrophils Relative %: 76 % (ref 43–77)
Platelets: 142 10*3/uL — ABNORMAL LOW (ref 150–400)
RBC: 4.11 MIL/uL — ABNORMAL LOW (ref 4.22–5.81)
RDW: 15.4 % (ref 11.5–15.5)
WBC: 6.1 10*3/uL (ref 4.0–10.5)

## 2012-12-31 LAB — RETICULOCYTES
RBC.: 4.11 MIL/uL — ABNORMAL LOW (ref 4.22–5.81)
Retic Ct Pct: 5.1 % — ABNORMAL HIGH (ref 0.4–3.1)

## 2012-12-31 MED ORDER — HYDROMORPHONE HCL PF 2 MG/ML IJ SOLN: 4.0000 mg | Freq: Once | INTRAMUSCULAR | Status: DC

## 2012-12-31 MED ORDER — HYDROMORPHONE HCL PF 2 MG/ML IJ SOLN
2.0000 mg | INTRAMUSCULAR | Status: DC | PRN
  Administered 2012-12-31 – 2013-01-01 (×7): 3 mg via INTRAVENOUS
  Filled 2012-12-31 (×9): qty 2

## 2012-12-31 MED ORDER — HYDROMORPHONE HCL PF 4 MG/ML IJ SOLN
INTRAMUSCULAR | Status: AC
  Administered 2012-12-31: 4 mg
  Filled 2012-12-31: qty 1

## 2012-12-31 MED ORDER — FOLIC ACID 1 MG PO TABS
1.0000 mg | ORAL_TABLET | Freq: Every day | ORAL | Status: DC
  Administered 2012-12-31 – 2013-01-04 (×4): 1 mg via ORAL
  Filled 2012-12-31 (×5): qty 1

## 2012-12-31 MED ORDER — KETOROLAC TROMETHAMINE 30 MG/ML IJ SOLN: 30.0000 mg | Freq: Four times a day (QID) | INTRAMUSCULAR | Status: DC

## 2012-12-31 MED ORDER — DEXTROSE-NACL 5-0.45 % IV SOLN
INTRAVENOUS | Status: DC
  Administered 2012-12-31 – 2013-01-02 (×5): via INTRAVENOUS

## 2012-12-31 MED ORDER — DIPHENHYDRAMINE HCL 50 MG/ML IJ SOLN: 12.5000 mg | INTRAMUSCULAR | Status: DC | PRN

## 2012-12-31 MED ORDER — HYDROMORPHONE HCL PF 2 MG/ML IJ SOLN
4.0000 mg | INTRAMUSCULAR | Status: AC
  Administered 2012-12-31 (×2): 4 mg via INTRAVENOUS

## 2012-12-31 MED ORDER — DIPHENHYDRAMINE HCL 25 MG PO CAPS: 25.0000 mg | ORAL_CAPSULE | ORAL | Status: DC | PRN

## 2012-12-31 MED ORDER — ONDANSETRON HCL 4 MG/2ML IJ SOLN
4.0000 mg | INTRAMUSCULAR | Status: DC | PRN
  Administered 2012-12-31 – 2013-01-01 (×2): 4 mg via INTRAVENOUS
  Filled 2012-12-31 (×2): qty 2

## 2012-12-31 MED ORDER — ONDANSETRON HCL 4 MG PO TABS: 4.0000 mg | ORAL_TABLET | ORAL | Status: DC | PRN

## 2012-12-31 MED ORDER — HYDROMORPHONE HCL PF 2 MG/ML IJ SOLN
2.0000 mg | INTRAMUSCULAR | Status: DC | PRN
  Administered 2012-12-31: 4 mg via INTRAVENOUS
  Filled 2012-12-31: qty 2

## 2012-12-31 NOTE — H&P (Signed)
[facility] History and Physical   Date: 12/31/2012  Patient name: [name] Medical record number: [id number] Date of birth: Jul 04, 1971 Age: 42 y.o. Gender: male PCP: August Saucer, ERIC, MD  Attending physician: Gwenyth Bender, MD  Chief Complaint: Left thigh and LUQ pain  History of Present Illness: 42 yr old AA male with  genotype SCD who called the clinic this am with reports progressively increasing and ongoing pain to his left upper thigh since yesterday and LUQ abdomen x 1 month. Of note, he was seen in the ED [facility]) on 12/20/12 for bilateral lower leg pain with the same. He was treated and released. Rx refill given 12/21/12 by Dr. August Saucer in the office. He was then seen in the office 12/28/12 by myself with reports pain improved (10/10 in ED on 1/23 to 3/10 at that time). He was given Rx Vicodin 5/300 by Dr. August Saucer to bridge the gap between refill's of his regular home pain med (Percocet 5/325). Since then, he has had no relief from the Vicodin, last taken at 2 am. He reports no longer having pain in his lower legs but intense sharp constant pain to his left thigh and LUQ all rated 10/10 and not relieved with his home medication regimen. He reports his baseline pain 2-3/10. He denies fever, chills, headache, dizziness, blurred vision, nausea, vomiting, abdominal pain, chest pain, SOB, LUTS, or priapism, but constant chronic LUQ pain as noted above. Most recent labs with T. Bili 1.2, WBC 6.8, Hgb 11.7, Plt 96, Retic 4.6% on 12/21/12. He will be admitted for 23 hours OBS further evaluation and treatment possible Sickle Cell Pain Crisis.       Meds: Prescriptions prior to admission  Medication Sig Dispense Refill  . calcium carbonate (TUMS - DOSED IN MG ELEMENTAL CALCIUM) 500 MG chewable tablet Chew 1 tablet by mouth 3 (three) times daily as needed. For heartburn      . ibuprofen (ADVIL,MOTRIN) 800 MG tablet Take 800 mg by mouth 2 (two) times daily as needed. For pain      . Multiple  Vitamin (MULTIVITAMIN WITH MINERALS) TABS Take 1 tablet by mouth every morning.      Marland Kitchen oxyCODONE-acetaminophen (PERCOCET) 7.5-325 MG per tablet Take 1 tablet by mouth every 6 (six) hours as needed. For pain      . oxyCODONE-acetaminophen (PERCOCET/ROXICET) 5-325 MG per tablet Take 2 tablets by mouth every 4 (four) hours as needed for pain.  17 tablet  0    Allergies: Review of patient's allergies indicates no known allergies. Past Medical History  Diagnosis Date  . Gastritis and duodenitis   . Pain in joint, site unspecified   . Physical exam, annual 02/18/2010  . GERD (gastroesophageal reflux disease)   . Sickle cell anemia   . Avascular necrosis of left femoral head   . Osteoarthrosis, unspecified whether generalized or localized, unspecified site   . Mental disorder   . Erectile dysfunction   . Low testosterone   . Tobacco use   . Mixed restrictive and obstructive lung disease    Past Surgical History  Procedure Date  . Electrocardiogram 01/31/2011  . Upper gastrointestinal endoscopy 11/18/2010   No family history on file. History   Social History  . Marital Status: Married    Spouse Name: N/A    Number of Children: N/A  . Years of Education: N/A   Occupational History  . Not on file.   Social History Main Topics  . Smoking status: Current Every  Day Smoker  . Smokeless tobacco: Not on file  . Alcohol Use: Not on file  . Drug Use: Not on file  . Sexually Active: Not on file   Other Topics Concern  . Not on file   Social History Narrative  . No narrative on file    Review of Systems: As noted HPI; otherwise negative  Physical Exam: There were no vitals taken for this visit. BP 156/94  Pulse 86  Temp 98.6 F (37 C) (Oral)  Resp 20  SpO2 100%  General Appearance:    Alert, cooperative, moderate acute distress secondary to [pain, appears stated age  Head:    Normocephalic, without obvious abnormality, atraumatic  Eyes:    PERRL, conjunctiva/corneas  clear, EOM's intact.      Ears:    Normal TM's and external ear canals, both ears  Nose:   Nares normal, septum midline, mucosa normal, no drainage    or sinus tenderness  Throat:   Lips, mucosa, and tongue normal; teeth and gums normal  Neck:   Supple, symmetrical, trachea midline, no adenopathy;       thyroid:  No enlargement/tenderness/nodules; no carotid   bruit or JVD  Back:     Symmetric, no curvature, ROM normal, no CVA tenderness  Lungs:     Clear to auscultation bilaterally, respirations unlabored  Chest wall:    No tenderness or deformity  Heart:    Regular rate and rhythm, S1 and S2 normal, no murmur, rub   or gallop  Abdomen:     Soft, tenderness LUQ with slightly palpable spleen; bowel sounds active all four quadrants  Genitalia:    Deferred; denies priapism  Rectal:    Deferred  Extremities:   Extremities normal, atraumatic, no cyanosis or edema  Pulses:   2+ and symmetric all extremities  Skin:   Skin color, texture, turgor normal, no rashes or lesions  Lymph nodes:   Cervical, supraclavicular, and axillary nodes normal  Neurologic:   CNII-XII intact. Normal strength, sensation and reflexes      throughout   Lab results: No results found for this or any previous visit (from the past 24 hour(s)).  Imaging results:  No results found.   Assessment & Plan: 1) Sickle Cell Pain Crisis- Dilaudid 4 mg IM x 1 dose (awaiting IV access); aggressively treat pain with dilaudid 2-4 mg IV every 2 hrs prn; give gentle IV hydration; prn anti-emetic and anti-puretic med's; [place] disease; no Hydrea; add Folic acid daily   2) Acute on Chronic Pain secondary to SCD- as noted above; follow 3) LUQ pain with history splenomegaly- most recent abdominal US 06/27/12 positive cholelithiasis, and splenomegaly; prior diagnostic testing (endoscopy and KUB 2011) revealed chronic gastritis, small hiatal hernia, and duodenitis; exam negative acute; follow  4) Left thigh pain- suspect bone vs muscle infarcts  with sickle cell crisis; will aggressively treat with Dilaudid 4 mg IV every 2 hr x 3 doses then resume q2 hr prn  5)Thrombocytopenia- mild, most recent Plt 96 on 12/21/12; chronic low in review labs dated back to 07/25/07 Plt 97; chronic use NSAID's ~5 yrs (Ibuprofen 800 mg BID prn); defer use Toradol at this time with adm labs pending  6) Disposition- discharge home vs inpatient following 23 hours pain management  We have confered with Dr. Marthann Schiller regarding the plan of care for this patient  Lizbeth Bark 12/31/2012, 10:43 AM

## 2012-12-31 NOTE — Progress Notes (Signed)
Pt very pleasant and cooperative. Complaining of sharp continuous pain to upper left leg pain. States pain is related to his sickle cell disorder. Spouse was here for a short for visit with pt. Pt states pain moves from the upper left leg down to the lower left leg. States pain at this time to upper and lower left leg. Pain is worse to lower left leg. Heart rate a bit tachy, but denies any cardiac related symptoms Did vomit x 1 and given IVP zofran per prn order. Patient is resting quietly at this time.  Patient ate a sandwich and drank po fluid,  tolerated well. No other complaints.IV site intact and IVF infusing per order.;

## 2012-12-31 NOTE — Progress Notes (Signed)
Patient arrive on the sickle cell unit via ambulatory. Patient arrive c/o sickle cell pain via left leg.,  NP notified and instructions given. Patient vitals are stable and no other complications noted.

## 2013-01-01 ENCOUNTER — Encounter (HOSPITAL_COMMUNITY): Payer: Self-pay

## 2013-01-01 ENCOUNTER — Non-Acute Institutional Stay (HOSPITAL_COMMUNITY): Payer: BC Managed Care – PPO

## 2013-01-01 LAB — CBC WITH DIFFERENTIAL/PLATELET
Basophils Absolute: 0 10*3/uL (ref 0.0–0.1)
Eosinophils Relative: 0 % (ref 0–5)
HCT: 22.9 % — ABNORMAL LOW (ref 39.0–52.0)
Hemoglobin: 8.2 g/dL — ABNORMAL LOW (ref 13.0–17.0)
Lymphocytes Relative: 13 % (ref 12–46)
Monocytes Relative: 6 % (ref 3–12)
Neutro Abs: 10.4 10*3/uL — ABNORMAL HIGH (ref 1.7–7.7)
RBC: 2.88 MIL/uL — ABNORMAL LOW (ref 4.22–5.81)
RDW: 15.7 % — ABNORMAL HIGH (ref 11.5–15.5)
WBC: 12.9 10*3/uL — ABNORMAL HIGH (ref 4.0–10.5)

## 2013-01-01 LAB — COMPREHENSIVE METABOLIC PANEL
Albumin: 3.5 g/dL (ref 3.5–5.2)
Alkaline Phosphatase: 54 U/L (ref 39–117)
BUN: 16 mg/dL (ref 6–23)
Calcium: 8.7 mg/dL (ref 8.4–10.5)
Creatinine, Ser: 0.98 mg/dL (ref 0.50–1.35)
GFR calc Af Amer: >90 mL/min (ref >90)
Glucose, Bld: 125 mg/dL — ABNORMAL HIGH (ref 70–99)
Total Protein: 6.7 g/dL (ref 6.0–8.3)

## 2013-01-01 MED ORDER — SENNA 8.6 MG PO TABS
1.0000 | ORAL_TABLET | Freq: Two times a day (BID) | ORAL | Status: DC
  Administered 2013-01-01 – 2013-01-04 (×5): 8.6 mg via ORAL
  Filled 2013-01-01 (×6): qty 1

## 2013-01-01 MED ORDER — ENOXAPARIN SODIUM 40 MG/0.4ML ~~LOC~~ SOLN
40.0000 mg | SUBCUTANEOUS | Status: DC
  Filled 2013-01-01: qty 0.4

## 2013-01-01 MED ORDER — PANTOPRAZOLE SODIUM 40 MG IV SOLR
40.0000 mg | Freq: Every day | INTRAVENOUS | Status: DC
  Filled 2013-01-01: qty 40

## 2013-01-01 MED ORDER — HYDROMORPHONE HCL PF 2 MG/ML IJ SOLN
4.0000 mg | INTRAMUSCULAR | Status: DC
  Administered 2013-01-01 (×3): 4 mg via INTRAVENOUS
  Administered 2013-01-01: 2 mg via INTRAVENOUS
  Administered 2013-01-01 – 2013-01-02 (×9): 4 mg via INTRAVENOUS
  Filled 2013-01-01: qty 2
  Filled 2013-01-01: qty 1
  Filled 2013-01-01: qty 2
  Filled 2013-01-01: qty 1
  Filled 2013-01-01 (×3): qty 2
  Filled 2013-01-01: qty 1
  Filled 2013-01-01 (×6): qty 2

## 2013-01-01 MED ORDER — ZOLPIDEM TARTRATE 5 MG PO TABS: 5.0000 mg | ORAL_TABLET | Freq: Every evening | ORAL | Status: DC | PRN

## 2013-01-01 MED ORDER — PANTOPRAZOLE SODIUM 40 MG PO TBEC: 40.0000 mg | DELAYED_RELEASE_TABLET | Freq: Every day | ORAL | Status: DC

## 2013-01-01 MED ORDER — KETOROLAC TROMETHAMINE 30 MG/ML IJ SOLN: 30.0000 mg | Freq: Four times a day (QID) | INTRAMUSCULAR | Status: DC

## 2013-01-01 NOTE — Progress Notes (Signed)
Pt resting quietly, with no complaints at this time. Does verbalize that the pain r/t his sickle cell crisis, that started out in the left upper leg, has resolved 100 %, but now c/o pain to left lower leg. States pain to front of left lower  Leg, and left calf area are his only painful areas. At this time rates pain as 3/10. Dr Ashley Royalty made aware of pt's elevated temp, and elevated WBC's that have doubled since the last blood draw on arrival. Pt denies any SOB, or chest pain. Report given to Aram Beecham, Charity fundraiser.

## 2013-01-01 NOTE — H&P (Signed)
Pt seen and examined and discussed with NP Romelle Muldoon Edwards. Agree with assessment and plan. 

## 2013-01-01 NOTE — H&P (Signed)
Sickle Cell History and Physical   Date: 01/01/2013  Patient name: [name] Medical record number: [id number] Date of birth: [birth year] Age: 42 y.o. Gender: male PCP: August Saucer ERIC, MD  Attending physician: Marthann Schiller, MD  Chief Complaint: Left leg pain  > than right and LLQ pain  Intermittent   History of Present Illness: This is a 42 yr old African American  male with [place] genotype SCD who was initially treated in the [facility] called the clinic for progressively increasing and ongoing pain to his left upper thigh/leg which migrated to right  and LUQ abdomen x 1 month. He rates his pain  8/10 in his thighs and legs describes as constant persistent throbbing pain. This pain in non radiating no associated symptoms.   Abdominal pain felt made worst when used IS tried to reproduce the pain and was unable to at this time. This is not a problem at this time. His baseline pain 2-3/10. He denies , chills, headache, dizziness, blurred vision, nausea, vomiting, abdominal pain, chest pain, SOB, or priapism. Note has seen bright red blood associated with BM with and without straining.  Meds: Prescriptions prior to admission  Medication Sig Dispense Refill  . calcium carbonate (TUMS - DOSED IN MG ELEMENTAL CALCIUM) 500 MG chewable tablet Chew 1 tablet by mouth 3 (three) times daily as needed. For heartburn      . ibuprofen (ADVIL,MOTRIN) 800 MG tablet Take 800 mg by mouth 2 (two) times daily as needed. For pain      . Multiple Vitamin (MULTIVITAMIN WITH MINERALS) TABS Take 1 tablet by mouth every morning.      Marland Kitchen oxyCODONE-acetaminophen (PERCOCET) 7.5-325 MG per tablet Take 1 tablet by mouth every 6 (six) hours as needed. For pain      . oxyCODONE-acetaminophen (PERCOCET/ROXICET) 5-325 MG per tablet Take 2 tablets by mouth every 4 (four) hours as needed for pain.  17 tablet  0    Allergies: Review of patient's allergies indicates no known allergies. Past Medical History  Diagnosis Date  .  Gastritis and duodenitis   . Pain in joint, site unspecified   . Physical exam, annual 02/18/2010  . GERD (gastroesophageal reflux disease)   . Sickle cell anemia   . Avascular necrosis of left femoral head   . Osteoarthrosis, unspecified whether generalized or localized, unspecified site   . Mental disorder   . Erectile dysfunction   . Low testosterone   . Tobacco use   . Mixed restrictive and obstructive lung disease    Past Surgical History  Procedure Date  . Electrocardiogram 01/31/2011  . Upper gastrointestinal endoscopy 11/18/2010   No family history on file. History   Social History  . Marital Status: Married    Spouse Name: N/A    Number of Children: N/A  . Years of Education: N/A   Occupational History  . Not on file.   Social History Main Topics  . Smoking status: Current Every Day Smoker  . Smokeless tobacco: Not on file  . Alcohol Use: Not on file  . Drug Use: Not on file  . Sexually Active: Not on file   Other Topics Concern  . Not on file   Social History Narrative  . No narrative on file    Review of Systems: A comprehensive review of systems was negative except for: Gastrointestinal: positive for blood in stool , LUQ pain , muscle skeletal stiffness and pain   Physical Exam: Blood pressure 126/66, pulse 119, temperature 100.6 F (38.1  C), temperature source Oral, resp. rate 19, height 5\' 6"  (1.676 m), weight 73.029 kg (161 lb), SpO2 97.00%. BP 130/65  Pulse 110  Temp 100.3 F (37.9 C) (Oral)  Resp 18  Ht 5\' 6"  (1.676 m)  Wt 73.029 kg (161 lb)  BMI 25.99 kg/m2  SpO2 98%  General Appearance:    Alert, cooperative, no distress, appears stated age  Head:    Normocephalic, without obvious abnormality, atraumatic  Eyes:    PERRL, conjunctiva/corneas clear, EOM's intact, fundi    benign, both eyes       Ears:    Normal TM's and external ear canals, both ears  Nose:   Nares normal, septum midline, mucosa normal, no drainage    or sinus  tenderness  Throat:   Lips, mucosa, and tongue normal; teeth and gums normal  Neck:   Supple, symmetrical, trachea midline, no adenopathy;       thyroid:  No enlargement/tenderness/nodules; no carotid   bruit or JVD  Back:     Symmetric, no curvature, ROM normal, no CVA tenderness  Lungs:     Clear to auscultation bilaterally, respirations unlabored  Chest wall:    No tenderness or deformity  Heart:    Regular rate and rhythm, S1 and S2 normal, no murmur, rub   or gallop  Abdomen:     Soft, tender guarding left side, bowel sounds active all four quadrants,   no masses, no organomegaly  Genitalia:    Denies priapism    Rectal:    Positive tone no internal hemoroids appreciated no blood on glove + stool     Extremities:   Extremities normal, atraumatic, no cyanosis or edema  Pulses:   2+ and symmetric all extremities  Skin:   Skin color, texture, turgor normal, no rashes or lesions  Lymph nodes:   Cervical, supraclavicular, and axillary nodes normal  Neurologic:   CNII-XII intact. Normal strength, sensation and reflexes      throughout    Lab results: Results for orders placed during the hospital encounter of 12/31/12 (from the past 24 hour(s))  COMPREHENSIVE METABOLIC PANEL     Status: Abnormal   Collection Time   12/31/12 10:45 AM      Component Value Range   Sodium 137  135 - 145 mEq/L   Potassium 3.9  3.5 - 5.1 mEq/L   Chloride 101  96 - 112 mEq/L   CO2 24  19 - 32 mEq/L   Glucose, Bld 115 (*) 70 - 99 mg/dL   BUN 17  6 - 23 mg/dL   Creatinine, Ser 1.19  0.50 - 1.35 mg/dL   Calcium 9.6  8.4 - 14.7 mg/dL   Total Protein 8.0  6.0 - 8.3 g/dL   Albumin 4.3  3.5 - 5.2 g/dL   AST 23  0 - 37 U/L   ALT 32  0 - 53 U/L   Alkaline Phosphatase 64  39 - 117 U/L   Total Bilirubin 1.0  0.3 - 1.2 mg/dL   GFR calc non Af Amer >90  >90 mL/min   GFR calc Af Amer >90  >90 mL/min  CBC WITH DIFFERENTIAL     Status: Abnormal   Collection Time   12/31/12 10:45 AM      Component Value Range   WBC  6.1  4.0 - 10.5 K/uL   RBC 4.11 (*) 4.22 - 5.81 MIL/uL   Hemoglobin 11.5 (*) 13.0 - 17.0 g/dL   HCT 82.9 (*) 56.2 -  52.0 %   MCV 78.6  78.0 - 100.0 fL   MCH 28.0  26.0 - 34.0 pg   MCHC 35.6  30.0 - 36.0 g/dL   RDW 40.9  81.1 - 91.4 %   Platelets 142 (*) 150 - 400 K/uL   Neutrophils Relative 76  43 - 77 %   Neutro Abs 4.6  1.7 - 7.7 K/uL   Lymphocytes Relative 18  12 - 46 %   Lymphs Abs 1.1  0.7 - 4.0 K/uL   Monocytes Relative 6  3 - 12 %   Monocytes Absolute 0.4  0.1 - 1.0 K/uL   Eosinophils Relative 0  0 - 5 %   Eosinophils Absolute 0.0  0.0 - 0.7 K/uL   Basophils Relative 0  0 - 1 %   Basophils Absolute 0.0  0.0 - 0.1 K/uL  RETICULOCYTES     Status: Abnormal   Collection Time   12/31/12 10:45 AM      Component Value Range   Retic Ct Pct 5.1 (*) 0.4 - 3.1 %   RBC. 4.11 (*) 4.22 - 5.81 MIL/uL   Retic Count, Manual 209.6 (*) 19.0 - 186.0 K/uL  COMPREHENSIVE METABOLIC PANEL     Status: Abnormal   Collection Time   01/01/13  5:30 AM      Component Value Range   Sodium 133 (*) 135 - 145 mEq/L   Potassium 3.8  3.5 - 5.1 mEq/L   Chloride 98  96 - 112 mEq/L   CO2 25  19 - 32 mEq/L   Glucose, Bld 125 (*) 70 - 99 mg/dL   BUN 16  6 - 23 mg/dL   Creatinine, Ser 7.82  0.50 - 1.35 mg/dL   Calcium 8.7  8.4 - 95.6 mg/dL   Total Protein 6.7  6.0 - 8.3 g/dL   Albumin 3.5  3.5 - 5.2 g/dL   AST 72 (*) 0 - 37 U/L   ALT 40  0 - 53 U/L   Alkaline Phosphatase 54  39 - 117 U/L   Total Bilirubin 1.1  0.3 - 1.2 mg/dL   GFR calc non Af Amer >90  >90 mL/min   GFR calc Af Amer >90  >90 mL/min  CBC WITH DIFFERENTIAL     Status: Abnormal   Collection Time   01/01/13  5:30 AM      Component Value Range   WBC 12.9 (*) 4.0 - 10.5 K/uL   RBC 2.88 (*) 4.22 - 5.81 MIL/uL   Hemoglobin 8.2 (*) 13.0 - 17.0 g/dL   HCT 21.3 (*) 08.6 - 57.8 %   MCV 79.5  78.0 - 100.0 fL   MCH 28.5  26.0 - 34.0 pg   MCHC 35.8  30.0 - 36.0 g/dL   RDW 46.9 (*) 62.9 - 52.8 %   Platelets 59 (*) 150 - 400 K/uL   Neutrophils  Relative 81 (*) 43 - 77 %   Lymphocytes Relative 13  12 - 46 %   Monocytes Relative 6  3 - 12 %   Eosinophils Relative 0  0 - 5 %   Basophils Relative 0  0 - 1 %   Neutro Abs 10.4 (*) 1.7 - 7.7 K/uL   Lymphs Abs 1.7  0.7 - 4.0 K/uL   Monocytes Absolute 0.8  0.1 - 1.0 K/uL   Eosinophils Absolute 0.0  0.0 - 0.7 K/uL   Basophils Absolute 0.0  0.0 - 0.1 K/uL   RBC Morphology POLYCHROMASIA PRESENT  Imaging results:  No results found.   Assessment & Plan: Patient Active Hospital Problem List: Sickle Cell Pain Crisis : scheduled Dilaudid 4 mg Q 2 hrs, to aggressively treat his pain crisis , gentle hydration, nausea with vomiting treated with Zofran and antipruitics for inflammatory process Leucocytosis: with temperature elevation ,marked elevation in WBC  r/o infectious process blood cultures X's 2 consider empiric abt's   Thrombocytopenia : unknown etiology recc B12, folate and platelet  antibodies and/or  abdominal U/S  Maybe useful to r/o splenic infarct v/s sequestration noting LUQ pain medication reviewed and not a causative factor (KUB negative) Baseline platelets 97 since 07/25/07 if persist may need a hematology consult as  Probable related to s to splenomegaly  Hemolytic anemia: HBG ranged from 9-11 monitor  Hx of Rectal bleeding:  place on laxative and stool softer PCP referred to GI no FH of colon ca  LUQ pain with hx of splenomegaly and cholelithiasis   Taleigha Pinson P 01/01/2013, 8:23 AM

## 2013-01-01 NOTE — Progress Notes (Signed)
Patient transfer to [address] report given to sue Fish farm manager. Inform nurse of orders given due to increase temp. Patient will travel via w/c., saline lock right iv site. No O2., no other complications noted at this note. Wife attended.

## 2013-01-01 NOTE — H&P (Signed)
Pt seen and examined and discussed with NP Vilinda Blanks. He is having no significant pain in the RUQ. His platelets are noted to be less than 150K (142) but patient has no active bleeding. Asessment and plan discussed and agreed upon with NP Vilinda Blanks.

## 2013-01-02 DIAGNOSIS — D696 Thrombocytopenia, unspecified: Secondary | ICD-10-CM

## 2013-01-02 DIAGNOSIS — D638 Anemia in other chronic diseases classified elsewhere: Secondary | ICD-10-CM | POA: Diagnosis present

## 2013-01-02 DIAGNOSIS — R1012 Left upper quadrant pain: Secondary | ICD-10-CM

## 2013-01-02 DIAGNOSIS — R509 Fever, unspecified: Secondary | ICD-10-CM | POA: Diagnosis present

## 2013-01-02 DIAGNOSIS — D649 Anemia, unspecified: Secondary | ICD-10-CM | POA: Diagnosis present

## 2013-01-02 DIAGNOSIS — R1013 Epigastric pain: Secondary | ICD-10-CM | POA: Diagnosis not present

## 2013-01-02 DIAGNOSIS — D57219 Sickle-cell/Hb-C disease with crisis, unspecified: Secondary | ICD-10-CM | POA: Diagnosis present

## 2013-01-02 LAB — COMPREHENSIVE METABOLIC PANEL
Alkaline Phosphatase: 50 U/L (ref 39–117)
BUN: 10 mg/dL (ref 6–23)
Chloride: 99 mEq/L (ref 96–112)
GFR calc Af Amer: >90 mL/min (ref >90)
Glucose, Bld: 126 mg/dL — ABNORMAL HIGH (ref 70–99)
Potassium: 3.7 mEq/L (ref 3.5–5.1)
Total Bilirubin: 1.2 mg/dL (ref 0.3–1.2)

## 2013-01-02 LAB — CBC WITH DIFFERENTIAL/PLATELET
Eosinophils Absolute: 0.1 10*3/uL (ref 0.0–0.7)
HCT: 21.6 % — ABNORMAL LOW (ref 39.0–52.0)
Hemoglobin: 7.7 g/dL — ABNORMAL LOW (ref 13.0–17.0)
Lymphs Abs: 2.3 10*3/uL (ref 0.7–4.0)
MCH: 28.4 pg (ref 26.0–34.0)
Monocytes Relative: 8 % (ref 3–12)
Neutro Abs: 7 10*3/uL (ref 1.7–7.7)
Neutrophils Relative %: 68 % (ref 43–77)
RBC: 2.71 MIL/uL — ABNORMAL LOW (ref 4.22–5.81)

## 2013-01-02 LAB — URINE CULTURE

## 2013-01-02 MED ORDER — FUROSEMIDE 10 MG/ML IJ SOLN
20.0000 mg | Freq: Once | INTRAMUSCULAR | Status: AC
  Administered 2013-01-03: 20 mg via INTRAVENOUS
  Filled 2013-01-02: qty 2

## 2013-01-02 MED ORDER — ACETAMINOPHEN 325 MG PO TABS
650.0000 mg | ORAL_TABLET | Freq: Four times a day (QID) | ORAL | Status: DC | PRN
  Administered 2013-01-02: 650 mg via ORAL
  Filled 2013-01-02: qty 2

## 2013-01-02 MED ORDER — ACETAMINOPHEN 325 MG PO TABS
325.0000 mg | ORAL_TABLET | Freq: Once | ORAL | Status: AC
  Administered 2013-01-02: 325 mg via ORAL
  Filled 2013-01-02: qty 1

## 2013-01-02 MED ORDER — PANTOPRAZOLE SODIUM 40 MG IV SOLR
40.0000 mg | Freq: Once | INTRAVENOUS | Status: AC
  Administered 2013-01-02: 40 mg via INTRAVENOUS
  Filled 2013-01-02: qty 40

## 2013-01-02 MED ORDER — PANTOPRAZOLE SODIUM 40 MG PO TBEC
40.0000 mg | DELAYED_RELEASE_TABLET | Freq: Every day | ORAL | Status: DC
  Administered 2013-01-04: 40 mg via ORAL
  Filled 2013-01-02 (×2): qty 1

## 2013-01-02 MED ORDER — HYDROMORPHONE HCL PF 2 MG/ML IJ SOLN
3.0000 mg | INTRAMUSCULAR | Status: DC
  Administered 2013-01-02 – 2013-01-03 (×9): 3 mg via INTRAVENOUS
  Filled 2013-01-02 (×9): qty 2

## 2013-01-02 NOTE — Progress Notes (Signed)
SICKLE CELL SERVICE PROGRESS NOTE  [name] WUJ:[id number] DOB: [birth year] DOA: 12/31/2012 PCP: August Saucer ERIC, MD  Assessment/Plan: Active Problems: Anemia: Patient has had a further trending down of his hemoglobin down to 7.7. His hemoglobin is unusually low for somebody with hemoglobin [place] particularly since the patient presented with a hemoglobin initially of 11.5. The patient has adamantly denied any epigastric pain up to this point. However his wife is present today and states that the patient has been complaining of epigastric pain on an ongoing basis and has frequently been taking TUMS for "heartburn". The patient does have a history of gastritis and has been using ibuprofen for pain management at home. In light of all these factors I will ask gastroenterology to see the patient in consultation he will likely need an endoscopic evaluation on this admission. I'm also concerned about the lower sickle cell count response to his anemia. Thus I will check a parvo B virus, CMV, EBV and HIV. Discontinue all NSAIDs. Start the patient on protonix. Will also transfuse 2 units of packed red blood cells.   Thrombocytopenia: Patient has an unexplained thrombocytopenia which can be a rare occurrence with use of ibuprofen. However, in light of the patient's fever I more concerned about an infectious process may the cause of his thrombocytopenia particularly in light of the accompanying anemia and a decreased reticulocyte response. I will check parvo B virus, CMV, EBV, HIV and histoplasmosis.    Hemoglobin [place] with crisis: Patient states that his pain is improved. We'll decrease IV Dilaudid 3 mg IV every 2 hours. I will hold on starting any oral medications until the patient has been evaluated by gastroenterology.   Epigastric abdominal pain: The patient now admits to epigastric abdominal pain. His wife reports that he has frequent heartburn and uses TUMS for this. The patient also has fairly frequent use of  ibuprofen for control of his pain at home. In light of this and are trending down of his hemoglobin, I will guaiac all stool and obtain a GI consult as the patient will likely need an endoscopic evaluation during his hospitalization. All NSAIDs will also be discontinued at this time.   Fever: Patient continues to have fever and has no leukocytosis evident today. It is unclear as to the association of the fever. However in light of the findings of decreased platelet and hemoglobin as well as a lower dose as at index will check parvo B virus, EBV, CMV, HIV, histoplasmosis. Blood cultures negative to date and urine culture shows no growth.  Code Status: Full code Family Communication: Wife at bedside and updated Disposition Plan: Home at the time of discharge  MATTHEWS,MICHELLE A.  Pager [phone]. If 7PM-7AM, please contact night-coverage.  01/02/2013, 5:38 PM  LOS: 2 days   Brief narrative: This is a 42 yr old African American male with  genotype SCD who was initially treated in the [facility] called the clinic for progressively increasing and ongoing pain to his left upper thigh/leg which migrated to right and LUQ abdomen x 1 month. He rates his pain 8/10 in his thighs and legs describes as constant persistent throbbing pain. This pain in non radiating no associated symptoms. Abdominal pain felt made worst when used IS tried to reproduce the pain and was unable to at this time. This is not a problem at this time. His baseline pain 2-3/10. He denies , chills, headache, dizziness, blurred vision, nausea, vomiting, abdominal pain, chest pain, SOB, or priapism. Note has seen bright red blood  associated with BM with and without straining.    Consultants:  Gastroenterology  Procedures:  None  Antibiotics:  None  HPI/Subjective: Patient states that his pain is feeling better. He rates the pain as a 6/10 and localized to his left lower extremity. The patient initially denied epigastric pain however  now admits to intermittent epigastric pain and some tenderness.  Objective: Filed Vitals:   01/02/13 0956 01/02/13 1401 01/02/13 1547 01/02/13 1718  BP: 130/76 135/67  132/74  Pulse: 104 109  100  Temp: 99.2 F (37.3 C) 100.9 F (38.3 C) 101.3 F (38.5 C) 98.9 F (37.2 C)  TempSrc: Oral Oral Oral Oral  Resp: 16 17  16   Height:      Weight:      SpO2: 98% 96%  100%   Weight change: -1.429 kg (-3 lb 2.4 oz)  Intake/Output Summary (Last 24 hours) at 01/02/13 1738 Last data filed at 01/02/13 1719  Gross per 24 hour  Intake   1440 ml  Output      0 ml  Net   1440 ml    General: Alert, awake, oriented x3, in no acute distress.  HEENT: /AT PEERL, EOMI, anicteric  Heart: Regular rate and rhythm, without murmurs, rubs, gallops.  Lungs: Clear to auscultation, no wheezing or rhonchi noted.   Abdomen: Mild epigastric tenderness, nondistended, positive bowel sounds, no masses no hepatosplenomegaly noted.  Neuro: No focal neurological deficits noted cranial nerves II through XII grossly intact. Strength 5 out of 5 in bilateral upper and lower extremities. Musculoskeletal: No warm swelling or erythema around joints, no spinal tenderness noted.    Data Reviewed: Basic Metabolic Panel:  Lab 01/02/13 7829 01/01/13 0530 12/31/12 1045  NA 134* 133* 137  K 3.7 3.8 3.9  CL 99 98 101  CO2 26 25 24   GLUCOSE 126* 125* 115*  BUN 10 16 17   CREATININE 0.79 0.98 0.75  CALCIUM 8.6 8.7 9.6  MG -- -- --  PHOS -- -- --   Liver Function Tests:  Lab 01/02/13 0402 01/01/13 0530 12/31/12 1045  AST 34 72* 23  ALT 38 40 32  ALKPHOS 50 54 64  BILITOT 1.2 1.1 1.0  PROT 6.5 6.7 8.0  ALBUMIN 3.5 3.5 4.3   No results found for this basename: LIPASE:5,AMYLASE:5 in the last 168 hours No results found for this basename: AMMONIA:5 in the last 168 hours CBC:  Lab 01/02/13 0402 01/01/13 0530 12/31/12 1045  WBC 10.2 12.9* 6.1  NEUTROABS 7.0 10.4* 4.6  HGB 7.7* 8.2* 11.5*  HCT 21.6* 22.9* 32.3*   MCV 79.7 79.5 78.6  PLT 66* 59* 142*   Cardiac Enzymes: No results found for this basename: CKTOTAL:5,CKMB:5,CKMBINDEX:5,TROPONINI:5 in the last 168 hours BNP (last 3 results) No results found for this basename: PROBNP:3 in the last 8760 hours CBG: No results found for this basename: GLUCAP:5 in the last 168 hours  Recent Results (from the past 240 hour(s))  CULTURE, BLOOD (ROUTINE X 2)     Status: Normal (Preliminary result)   Collection Time   01/01/13  8:45 AM      Component Value Range Status Comment   Specimen Description BLOOD LEFT HAND   Final    Special Requests BOTTLES DRAWN AEROBIC AND ANAEROBIC 5CC   Final    Culture  Setup Time 01/01/2013 13:28   Final    Culture     Final    Value:        BLOOD CULTURE RECEIVED NO GROWTH  TO DATE CULTURE WILL BE HELD FOR 5 DAYS BEFORE ISSUING A FINAL NEGATIVE REPORT   Report Status PENDING   Incomplete   CULTURE, BLOOD (ROUTINE X 2)     Status: Normal (Preliminary result)   Collection Time   01/01/13  8:50 AM      Component Value Range Status Comment   Specimen Description BLOOD LEFT ARM   Final    Special Requests BOTTLES DRAWN AEROBIC AND ANAEROBIC 5CC   Final    Culture  Setup Time 01/01/2013 13:27   Final    Culture     Final    Value:        BLOOD CULTURE RECEIVED NO GROWTH TO DATE CULTURE WILL BE HELD FOR 5 DAYS BEFORE ISSUING A FINAL NEGATIVE REPORT   Report Status PENDING   Incomplete   URINE CULTURE     Status: Normal   Collection Time   01/01/13 11:05 AM      Component Value Range Status Comment   Specimen Description URINE, CLEAN CATCH   Final    Special Requests NONE   Final    Culture  Setup Time 01/01/2013 16:17   Final    Colony Count NO GROWTH   Final    Culture NO GROWTH   Final    Report Status 01/02/2013 FINAL   Final      Studies: Dg Abd 1 View  01/01/2013  *RADIOLOGY REPORT*  Clinical Data: Left upper quadrant pain, history of sickle cell disease  ABDOMEN - 1 VIEW  Comparison: Ultrasound of the kidneys of  06/27/2012 and KUB prior upper GI dated 08/09/2010  Findings: The bowel gas pattern is nonspecific.  No obstruction is seen.  The spleen appears enlarged, but stable compared to the KUB from 2011.  Bony changes of sickle cell disease are noted diffusely.  No opaque calculi are seen.  IMPRESSION:  1.  No bowel obstruction.  No opaque calculi. 2.  Stable splenomegaly. 3.  Diffuse bony changes of sickle cell disease.   Original Report Authenticated By: Dwyane Dee, M.D.    Dg Chest Portable 1 View  12/21/2012  *RADIOLOGY REPORT*  Clinical Data: Sickle cell crisis.  PORTABLE CHEST - 1 VIEW  Comparison: 10/22/2012  Findings: Single view of the chest demonstrates clear lungs. Stable appearance of the heart and mediastinum.  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 There appears to be sclerosis in the right humeral head.  IMPRESSION: No acute cardiopulmonary disease.  Chronic deformity of the left shoulder.   Original Report Authenticated By: Richarda Overlie, M.D.     Scheduled Meds:   . folic acid  1 mg Oral Daily  . furosemide  20 mg Intravenous Once  .  HYDROmorphone (DILAUDID) injection  4 mg Intravenous Q2H  . pantoprazole  40 mg Oral Daily  . senna  1 tablet Oral BID   Time spent 35 minutes

## 2013-01-02 NOTE — Progress Notes (Signed)
Patient temp is up to 101.3,Dr. Ashley Royalty made aware,new orders received and to hold for blood transfusion until temp will be stable, will endorsed accordingly. - Leonard Marin RN

## 2013-01-02 NOTE — Progress Notes (Signed)
Dr. Ashley Royalty made aware re; patient's temp of 100.9, new order received.Hulda Marin RN

## 2013-01-02 NOTE — Consult Note (Signed)
Referring Provider: Dr. Ashley Royalty Primary Care Physician:  Willey Blade, MD Primary Gastroenterologist:  None, unassigned  Reason for Consultation:  Anemia, LUQ abdominal pain  HPI: [name] is a 42 y.o. 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  He has also been experiencing fevers up to 101.3 degrees.  Was found to have Hgb of 8.2 grams on admission and down to 7.7 grams today.  This is down from his baseline of about 11.5 grams.  This is normocytic, but medicine reports that he is not hemolyzing.  He is going to be transfused with some PRBC's tonight.  Complains of LUQ abdominal pain that does not worsen with eating, but does hurt more with deep breaths.  Has an enlarged spleen on most recent ultrasound.  Also has chronic thrombocytopenia with platelets of 66.  He does have occasional heartburn/reflux at home when he eats something spicy and takes TUMS as needed for that.  Takes ibuprofen a couple of times a day about two days a week for pain.  He thinks that he had an endoscopy somewhere in [place] but I cannot find those records.  Denies nausea and vomiting.  Says that appetite has been good and there has not been any weight loss.    About one month ago he had some blood on the toilet paper after having a BM on one occasion, but nothing further since that time.  Has not moved his bowels since his admission early yesterday morning, but says that stools were normal and brown at home.  Has been slightly tachycardic as well.   Past Medical History  Diagnosis Date  . Gastritis and duodenitis   . Pain in joint, site unspecified   . Physical exam, annual 02/18/2010  . GERD (gastroesophageal reflux disease)   . Sickle cell anemia   . Avascular necrosis of left femoral head   . Osteoarthrosis, unspecified whether generalized or localized, unspecified site   . Mental disorder   . Erectile dysfunction   . Low testosterone   . Tobacco use    . Mixed restrictive and obstructive lung disease     Past Surgical History  Procedure Date  . Electrocardiogram 01/31/2011  . Upper gastrointestinal endoscopy 11/18/2010    Prior to Admission medications   Medication Sig Start Date End Date Taking? Authorizing Provider  calcium carbonate (TUMS - DOSED IN MG ELEMENTAL CALCIUM) 500 MG chewable tablet Chew 1 tablet by mouth 3 (three) times daily as needed. For heartburn   Yes Historical Provider, MD  HYDROcodone-acetaminophen (NORCO/VICODIN) 5-325 MG per tablet Take 1 tablet by mouth every 6 (six) hours as needed. For pain.   Yes Historical Provider, MD  ibuprofen (ADVIL,MOTRIN) 800 MG tablet Take 800 mg by mouth 2 (two) times daily as needed. For pain   Yes Historical Provider, MD  Multiple Vitamin (MULTIVITAMIN WITH MINERALS) TABS Take 1 tablet by mouth every morning.   Yes Historical Provider, MD  oxyCODONE-acetaminophen (PERCOCET) 7.5-325 MG per tablet Take 1 tablet by mouth every 6 (six) hours as needed. For pain    Historical Provider, MD  oxyCODONE-acetaminophen (PERCOCET/ROXICET) 5-325 MG per tablet Take 2 tablets by mouth every 4 (four) hours as needed for pain. 12/21/12   Jones Skene, MD    Current Facility-Administered Medications  Medication Dose Route Frequency Provider Last Rate Last Dose  . acetaminophen (TYLENOL) tablet 650 mg  650 mg Oral Q6H PRN Altha Harm, MD   650 mg  at 01/02/13 1433  . diphenhydrAMINE (BENADRYL) capsule 25-50 mg  25-50 mg Oral Q4H PRN Lizbeth Bark, FNP       Or  . diphenhydrAMINE (BENADRYL) injection 12.5-25 mg  12.5-25 mg Intravenous Q4H PRN Lizbeth Bark, FNP      . folic acid (FOLVITE) tablet 1 mg  1 mg Oral Daily Lizbeth Bark, FNP   1 mg at 01/02/13 0935  . furosemide (LASIX) injection 20 mg  20 mg Intravenous Once Altha Harm, MD      . HYDROmorphone (DILAUDID) injection 4 mg  4 mg Intravenous Q2H Grayce Sessions, NP   4 mg at 01/02/13 1551  . ondansetron (ZOFRAN) tablet 4 mg  4  mg Oral Q4H PRN Lizbeth Bark, FNP       Or  . ondansetron [facility]) injection 4 mg  4 mg Intravenous Q4H PRN Lizbeth Bark, FNP   4 mg at 01/01/13 1610  . pantoprazole (PROTONIX) EC tablet 40 mg  40 mg Oral Daily Altha Harm, MD      . senna [facility]) tablet 8.6 mg  1 tablet Oral BID Grayce Sessions, NP   8.6 mg at 01/02/13 0935  . zolpidem (AMBIEN) tablet 5 mg  5 mg Oral QHS PRN Grayce Sessions, NP        Allergies as of 12/31/2012  . (No Known Allergies)    History reviewed. No pertinent family history.  History   Social History  . Marital Status: Married    Spouse Name: N/A    Number of Children: N/A  . Years of Education: N/A   Occupational History  . Not on file.   Social History Main Topics  . Smoking status: Current Every Day Smoker  . Smokeless tobacco: Never Used  . Alcohol Use: Yes     Comment: OCCASIONAL  . Drug Use: No  . Sexually Active: No   Other Topics Concern  . Not on file   Social History Narrative  . No narrative on file    Review of Systems: Ten point ROS is O/W negative except as mentioned in HPI.  Physical Exam: Vital signs in last 24 hours: Temp:  [98.8 F (37.1 C)-101.3 F (38.5 C)] 101.3 F (38.5 C) (02/05 1547) Pulse Rate:  [104-111] 109  (02/05 1401) Resp:  [16-20] 17  (02/05 1401) BP: (120-137)/(61-78) 135/67 mmHg (02/05 1401) SpO2:  [96 %-100 %] 96 % (02/05 1401) Weight:  [161 lb (73.029 kg)] 161 lb (73.029 kg) (02/05 0602) Last BM Date: 01/01/13 General:   Alert, Well-developed, well-nourished, pleasant and cooperative in NAD Head:  Normocephalic and atraumatic. Eyes:  Sclera clear, no icterus.  Conjunctiva pink. Ears:  Normal auditory acuity. Mouth:  No deformity or lesions.   Lungs:  Clear throughout to auscultation.  No wheezes, crackles, or rhonchi.  Heart:  Tachy but regular rhythm; no murmurs, clicks, rubs,  or gallops. Abdomen:  Soft, non-distended, BS present, some TTP in the LUQ without R/R/G.  ? Palpable  spleen   Rectal:  Deferred  Msk:  Symmetrical without gross deformities. Pulses:  Normal pulses noted. Extremities:  Without clubbing or edema. Neurologic:  Alert and  oriented x4;  grossly normal neurologically. Skin:  Intact without significant lesions or rashes. Psych:  Alert and cooperative. Normal mood and affect.  Intake/Output from previous day: 02/04 0701 - 02/05 0700 In: 1440 [P.O.:840; I.V.:600] Out: 850 [Urine:850] Intake/Output this shift: Total I/O In: 960 [P.O.:960] Out: -  Lab Results:  Basename 01/02/13 0402 01/01/13 0530 12/31/12 1045  WBC 10.2 12.9* 6.1  HGB 7.7* 8.2* 11.5*  HCT 21.6* 22.9* 32.3*  PLT 66* 59* 142*   BMET  Basename 01/02/13 0402 01/01/13 0530 12/31/12 1045  NA 134* 133* 137  K 3.7 3.8 3.9  CL 99 98 101  CO2 26 25 24   GLUCOSE 126* 125* 115*  BUN 10 16 17   CREATININE 0.79 0.98 0.75  CALCIUM 8.6 8.7 9.6   LFT  Basename 01/02/13 0402  PROT 6.5  ALBUMIN 3.5  AST 34  ALT 38  ALKPHOS 50  BILITOT 1.2  BILIDIR --  IBILI --    Studies/Results: Dg Abd 1 View  01/01/2013  *RADIOLOGY REPORT*  Clinical Data: Left upper quadrant pain, history of sickle cell disease  ABDOMEN - 1 VIEW  Comparison: Ultrasound of the kidneys of 06/27/2012 and KUB prior upper GI dated 08/09/2010  Findings: The bowel gas pattern is nonspecific.  No obstruction is seen.  The spleen appears enlarged, but stable compared to the KUB from 2011.  Bony changes of sickle cell disease are noted diffusely.  No opaque calculi are seen.  IMPRESSION:  1.  No bowel obstruction.  No opaque calculi. 2.  Stable splenomegaly. 3.  Diffuse bony changes of sickle cell disease.   Original Report Authenticated By: Dwyane Dee, M.D.     IMPRESSION:  -Anemia:  Normocytic with Hgb down 3-4 grams from baseline.  Can see anemia with [place], but determined by primary service that he is not hemolyzing.  Complains of LUQ abdominal pain, sounds like it could be muscular or could be secondary to his  splenomegaly as well.  Takes NSAID's at home.  Has occasional heartburn/reflux.  One episode of rectal bleeding seen on the toilet paper fairly recently. -[place]lton disease with LE pain, fever, tachycardia acutely -Splenomegaly and thrombocytopenia secondary to [place] -Fevers, etiology unclear   PLAN: -Agree with transfusion. -Will plan for EGD 2/6 to rule out PUD, erosive gastritis, etc. -Agree with daily PPI for now.   ZEHR, JESSICA D.  01/02/2013, 4:12 PM Pager number [phone]    I have taken a history, examined the patient and reviewed the chart. I agree with the Advanced Practitioner's note, impression and recommendations. EGD tomorrow to R/O ulcer, gastritis, esophagitis. PPI daily. LUQ pain could be from splenomegaly. 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  Meryl Dare MD [facility]

## 2013-01-03 ENCOUNTER — Encounter (HOSPITAL_COMMUNITY)
Admission: AD | Disposition: A | Discharge status: Home / Self Care | Payer: Self-pay | Source: Home / Self Care | Attending: Internal Medicine

## 2013-01-03 ENCOUNTER — Inpatient Hospital Stay (HOSPITAL_COMMUNITY): Payer: BC Managed Care – PPO

## 2013-01-03 ENCOUNTER — Encounter (HOSPITAL_COMMUNITY): Payer: Self-pay

## 2013-01-03 HISTORY — PX: ESOPHAGOGASTRODUODENOSCOPY: SHX5428

## 2013-01-03 LAB — EBV AB TO VIRAL CAPSID AG PNL, IGG+IGM
EBV VCA IgG: 67.6 U/mL — ABNORMAL HIGH (ref <18.0)
EBV VCA IgM: <10 U/mL (ref <36.0)

## 2013-01-03 LAB — CBC WITH DIFFERENTIAL/PLATELET
Eosinophils Absolute: 0.1 10*3/uL (ref 0.0–0.7)
Eosinophils Relative: 1 % (ref 0–5)
Hemoglobin: 9.8 g/dL — ABNORMAL LOW (ref 13.0–17.0)
Lymphs Abs: 1.6 10*3/uL (ref 0.7–4.0)
MCH: 28 pg (ref 26.0–34.0)
MCV: 79.4 fL (ref 78.0–100.0)
Monocytes Absolute: 0.7 10*3/uL (ref 0.1–1.0)
Monocytes Relative: 8 % (ref 3–12)
Platelets: 62 10*3/uL — ABNORMAL LOW (ref 150–400)
RBC: 3.5 MIL/uL — ABNORMAL LOW (ref 4.22–5.81)

## 2013-01-03 SURGERY — EGD (ESOPHAGOGASTRODUODENOSCOPY)
Anesthesia: Moderate Sedation

## 2013-01-03 MED ORDER — MIDAZOLAM HCL 10 MG/2ML IJ SOLN
INTRAMUSCULAR | Status: AC
  Filled 2013-01-03: qty 2

## 2013-01-03 MED ORDER — OXYCODONE-ACETAMINOPHEN 5-325 MG PO TABS
1.0000 | ORAL_TABLET | ORAL | Status: DC
  Administered 2013-01-03 – 2013-01-04 (×5): 1 via ORAL
  Filled 2013-01-03 (×5): qty 1

## 2013-01-03 MED ORDER — OXYCODONE HCL 5 MG PO TABS
5.0000 mg | ORAL_TABLET | ORAL | Status: DC
  Administered 2013-01-03 – 2013-01-04 (×5): 5 mg via ORAL
  Filled 2013-01-03 (×5): qty 1

## 2013-01-03 MED ORDER — MIDAZOLAM HCL 10 MG/2ML IJ SOLN
INTRAMUSCULAR | Status: DC | PRN
  Administered 2013-01-03 (×2): 2 mg via INTRAVENOUS
  Administered 2013-01-03: 1 mg via INTRAVENOUS

## 2013-01-03 MED ORDER — IOHEXOL 300 MG/ML  SOLN
50.0000 mL | Freq: Once | INTRAMUSCULAR | Status: AC | PRN
  Administered 2013-01-03: 50 mL via ORAL

## 2013-01-03 MED ORDER — SODIUM CHLORIDE 0.9 % IV SOLN
INTRAVENOUS | Status: DC | PRN
  Administered 2013-01-03: 250 mL via INTRAVENOUS

## 2013-01-03 MED ORDER — SODIUM CHLORIDE 0.9 % IV SOLN
INTRAVENOUS | Status: DC
  Administered 2013-01-03: 250 mL via INTRAVENOUS

## 2013-01-03 MED ORDER — BUTAMBEN-TETRACAINE-BENZOCAINE 2-2-14 % EX AERO
INHALATION_SPRAY | CUTANEOUS | Status: DC | PRN
  Administered 2013-01-03: 2 via TOPICAL

## 2013-01-03 MED ORDER — FENTANYL CITRATE 0.05 MG/ML IJ SOLN
INTRAMUSCULAR | Status: DC | PRN
  Administered 2013-01-03 (×3): 25 ug via INTRAVENOUS

## 2013-01-03 MED ORDER — DIPHENHYDRAMINE HCL 50 MG/ML IJ SOLN
INTRAMUSCULAR | Status: AC
  Filled 2013-01-03: qty 1

## 2013-01-03 MED ORDER — IOHEXOL 300 MG/ML  SOLN
100.0000 mL | Freq: Once | INTRAMUSCULAR | Status: AC | PRN
  Administered 2013-01-03: 100 mL via INTRAVENOUS

## 2013-01-03 MED ORDER — HYDROMORPHONE HCL PF 2 MG/ML IJ SOLN
2.0000 mg | INTRAMUSCULAR | Status: DC | PRN
  Administered 2013-01-03 – 2013-01-04 (×2): 2 mg via INTRAVENOUS
  Filled 2013-01-03 (×2): qty 1

## 2013-01-03 MED ORDER — FENTANYL CITRATE 0.05 MG/ML IJ SOLN
INTRAMUSCULAR | Status: AC
  Filled 2013-01-03: qty 2

## 2013-01-03 NOTE — Plan of Care (Signed)
Problem: Phase III Progression Outcomes Goal: Pain controlled on oral analgesia Outcome: Progressing Given one dose of prn dilaudid at 2210. Pain 4/10 even after scheduled po pain meds

## 2013-01-03 NOTE — Plan of Care (Signed)
Problem: Phase III Progression Outcomes Goal: Progress with ambulation Outcome: Completed/Met Date Met:  01/03/13 Ambulated in hall today x2 with family

## 2013-01-03 NOTE — Progress Notes (Addendum)
SICKLE CELL SERVICE PROGRESS NOTE  [name] ZOX:[id number] DOB: 1971/10/18 DOA: 12/31/2012 PCP: Willey Blade, MD  Assessment/Plan: Active Problems: Anemia: Patient is S/P EGD which showed duodenitis. He is also status post transfusion 2 units of packed blood cells. We'll continue protonic soma daily basis. There is some concern as to whether or not the patient's bone marrow is responding well to his anemia. I will recheck his hemoglobin tomorrow as well as reticulocyte count. Problem B virus, EBV, CMV are pending. HIV was found to be negative   Thrombocytopenia: Patient has an unexplained thrombocytopenia which can be a rare occurrence with use of ibuprofen. However, in light of the patient's fever I more concerned about an infectious process may the cause of his thrombocytopenia particularly in light of the accompanying anemia and a decreased reticulocyte response. I will check parvo B virus, CMV, EBV, HIV and histoplasmosis. The patient shows no signs of active bleeding at this time this no platelet transfusion ordered. I would recommend the patient has his platelets rechecked and is in a convalescent state. If they still are low then I would recommend a referral to hematology as an outpatient.   Hemoglobin [place] with crisis: Patient states that his pain is improved. The patient is status post EGD I started the patient on OxyContin 5+ Vicodin 5/25. I will decrease the IV Dilaudid 2 mg IV every 2 hours as needed. I've encouraged the patient to use adjunct heating and to increase activity. If pain continues to be tolerable anticipate discharge tomorrow.   Epigastric abdominal pain: The patient now admits to epigastric abdominal pain. His wife reports that he has frequent heartburn and uses TUMS for this. The patient also has fairly frequent use of ibuprofen for control of his pain at home. In light of this and are trending down of his hemoglobin, I will guaiac all stool and obtain a GI consult as the  patient will likely need an endoscopic evaluation during his hospitalization. All NSAIDs will also be discontinued at this time.   Fever: Patient has been afebrile for the past 24 hours and low blood culture so far negative. However in light of the findings of decreased platelet and hemoglobin as well as a lower dose as at index will check parvo B virus, EBV, CMV, HIV, histoplasmosis. Blood cultures negative to date and urine culture shows no growth.   VTE Prophylaxis: Lovenox not given secondary to suspected GI bleed and thrombocytopenia. Pt has declined SCD's secondary to pain in LE's associated with VOC.   Code Status: Full code Family Communication: Wife at bedside and updated Disposition Plan: Home in the next 24-48 hours.  MATTHEWS,MICHELLE A.  Pager [phone]. If 7PM-7AM, please contact night-coverage.  01/03/2013, 4:13 PM  LOS: 3 days   Brief narrative: This is a 42 yr old African American male with Burnet genotype SCD who was initially treated in the [facility] called the clinic for progressively increasing and ongoing pain to his left upper thigh/leg which migrated to right and LUQ abdomen x 1 month. He rates his pain 8/10 in his thighs and legs describes as constant persistent throbbing pain. This pain in non radiating no associated symptoms. Abdominal pain felt made worst when used IS tried to reproduce the pain and was unable to at this time. This is not a problem at this time. His baseline pain 2-3/10. He denies , chills, headache, dizziness, blurred vision, nausea, vomiting, abdominal pain, chest pain, SOB, or priapism. Note has seen bright red blood associated with  BM with and without straining.    Consultants:  Gastroenterology  Procedures:  None  Antibiotics:  None  HPI/Subjective: Patient states that his pain is feeling better. He rates the pain as a 5/10 and localized to his left lower extremity.  Objective: Filed Vitals:   01/03/13 1150 01/03/13 1200 01/03/13 1206 01/03/13  1413  BP: 123/84 123/70 124/77 136/77  Pulse: 100   105  Temp:    99 F (37.2 C)  TempSrc:    Oral  Resp: 15 16 14 18   Height:      Weight:      SpO2: 97% 94% 99% 100%   Weight change: -1.8 kg (-3 lb 15.5 oz)  Intake/Output Summary (Last 24 hours) at 01/03/13 1613 Last data filed at 01/03/13 1413  Gross per 24 hour  Intake 1656.67 ml  Output      0 ml  Net 1656.67 ml    General: Alert, awake, oriented x3, in no acute distress.  HEENT: [place]/AT PEERL, EOMI, anicteric  Heart: Regular rate and rhythm, without murmurs, rubs, gallops.  Lungs: Clear to auscultation, no wheezing or rhonchi noted.   Abdomen: Mild epigastric tenderness, nondistended, positive bowel sounds, no masses no hepatosplenomegaly noted.  Neuro: No focal neurological deficits noted cranial nerves II through XII grossly intact. Strength 5/5 in bilateral upper and lower extremities. Musculoskeletal: No warm swelling or erythema around joints, no spinal tenderness noted.    Data Reviewed: Basic Metabolic Panel:  Lab 01/02/13 9811 01/01/13 0530 12/31/12 1045  NA 134* 133* 137  K 3.7 3.8 3.9  CL 99 98 101  CO2 26 25 24   GLUCOSE 126* 125* 115*  BUN 10 16 17   CREATININE 0.79 0.98 0.75  CALCIUM 8.6 8.7 9.6  MG -- -- --  PHOS -- -- --   Liver Function Tests:  Lab 01/02/13 0402 01/01/13 0530 12/31/12 1045  AST 34 72* 23  ALT 38 40 32  ALKPHOS 50 54 64  BILITOT 1.2 1.1 1.0  PROT 6.5 6.7 8.0  ALBUMIN 3.5 3.5 4.3   No results found for this basename: LIPASE:5,AMYLASE:5 in the last 168 hours No results found for this basename: AMMONIA:5 in the last 168 hours CBC:  Lab 01/03/13 0400 01/02/13 0402 01/01/13 0530 12/31/12 1045  WBC 9.6 10.2 12.9* 6.1  NEUTROABS 7.1 7.0 10.4* 4.6  HGB 9.8* 7.7* 8.2* 11.5*  HCT 27.8* 21.6* 22.9* 32.3*  MCV 79.4 79.7 79.5 78.6  PLT 62* 66* 59* 142*   Cardiac Enzymes: No results found for this basename: CKTOTAL:5,CKMB:5,CKMBINDEX:5,TROPONINI:5 in the last 168 hours BNP  (last 3 results) No results found for this basename: PROBNP:3 in the last 8760 hours CBG: No results found for this basename: GLUCAP:5 in the last 168 hours  Recent Results (from the past 240 hour(s))  CULTURE, BLOOD (ROUTINE X 2)     Status: Normal (Preliminary result)   Collection Time   01/01/13  8:45 AM      Component Value Range Status Comment   Specimen Description BLOOD LEFT HAND   Final    Special Requests BOTTLES DRAWN AEROBIC AND ANAEROBIC 5CC   Final    Culture  Setup Time 01/01/2013 13:28   Final    Culture     Final    Value:        BLOOD CULTURE RECEIVED NO GROWTH TO DATE CULTURE WILL BE HELD FOR 5 DAYS BEFORE ISSUING A FINAL NEGATIVE REPORT   Report Status PENDING   Incomplete   CULTURE,  BLOOD (ROUTINE X 2)     Status: Normal (Preliminary result)   Collection Time   01/01/13  8:50 AM      Component Value Range Status Comment   Specimen Description BLOOD LEFT ARM   Final    Special Requests BOTTLES DRAWN AEROBIC AND ANAEROBIC 5CC   Final    Culture  Setup Time 01/01/2013 13:27   Final    Culture     Final    Value:        BLOOD CULTURE RECEIVED NO GROWTH TO DATE CULTURE WILL BE HELD FOR 5 DAYS BEFORE ISSUING A FINAL NEGATIVE REPORT   Report Status PENDING   Incomplete   URINE CULTURE     Status: Normal   Collection Time   01/01/13 11:05 AM      Component Value Range Status Comment   Specimen Description URINE, CLEAN CATCH   Final    Special Requests NONE   Final    Culture  Setup Time 01/01/2013 16:17   Final    Colony Count NO GROWTH   Final    Culture NO GROWTH   Final    Report Status 01/02/2013 FINAL   Final   CULTURE, BLOOD (ROUTINE X 2)     Status: Normal (Preliminary result)   Collection Time   01/02/13  5:24 PM      Component Value Range Status Comment   Specimen Description BLOOD LEFT ARM   Final    Special Requests BOTTLES DRAWN AEROBIC AND ANAEROBIC 5CC   Final    Culture  Setup Time 01/02/2013 23:32   Final    Culture     Final    Value:        BLOOD  CULTURE RECEIVED NO GROWTH TO DATE CULTURE WILL BE HELD FOR 5 DAYS BEFORE ISSUING A FINAL NEGATIVE REPORT   Report Status PENDING   Incomplete   CULTURE, BLOOD (ROUTINE X 2)     Status: Normal (Preliminary result)   Collection Time   01/02/13  5:26 PM      Component Value Range Status Comment   Specimen Description BLOOD LEFT HAND   Final    Special Requests BOTTLES DRAWN AEROBIC ONLY 3CC   Final    Culture  Setup Time 01/02/2013 23:34   Final    Culture     Final    Value:        BLOOD CULTURE RECEIVED NO GROWTH TO DATE CULTURE WILL BE HELD FOR 5 DAYS BEFORE ISSUING A FINAL NEGATIVE REPORT   Report Status PENDING   Incomplete      Studies: Dg Abd 1 View  01/01/2013  *RADIOLOGY REPORT*  Clinical Data: Left upper quadrant pain, history of sickle cell disease  ABDOMEN - 1 VIEW  Comparison: Ultrasound of the kidneys of 06/27/2012 and KUB prior upper GI dated 08/09/2010  Findings: The bowel gas pattern is nonspecific.  No obstruction is seen.  The spleen appears enlarged, but stable compared to the KUB from 2011.  Bony changes of sickle cell disease are noted diffusely.  No opaque calculi are seen.  IMPRESSION:  1.  No bowel obstruction.  No opaque calculi. 2.  Stable splenomegaly. 3.  Diffuse bony changes of sickle cell disease.   Original Report Authenticated By: Dwyane Dee, M.D.    Dg Chest Portable 1 View  12/21/2012  *RADIOLOGY REPORT*  Clinical Data: Sickle cell crisis.  PORTABLE CHEST - 1 VIEW  Comparison: 10/22/2012  Findings: Single view of the  chest demonstrates clear lungs. Stable appearance of the heart and mediastinum.  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 There appears to be sclerosis in the right humeral head.  IMPRESSION: No acute cardiopulmonary disease.  Chronic deformity of the left shoulder.   Original Report Authenticated By: Richarda Overlie, M.D.     Scheduled Meds:    . folic acid  1 mg Oral Daily  .  HYDROmorphone (DILAUDID)  injection  3 mg Intravenous Q2H  . oxyCODONE-acetaminophen  1 tablet Oral Q4H   And  . oxyCODONE  5 mg Oral Q4H  . pantoprazole  40 mg Oral Daily  . senna  1 tablet Oral BID   Time spent 32 minutes

## 2013-01-03 NOTE — Interval H&P Note (Signed)
History and Physical Interval Note:  01/03/2013 11:07 AM  [name]  has presented today for surgery, with the diagnosis of Anemia; LUQ abdominal pain  The various methods of treatment have been discussed with the patient and family. After consideration of risks, benefits and other options for treatment, the patient has consented to  Procedure(s) (LRB) with comments: ESOPHAGOGASTRODUODENOSCOPY (EGD) (N/A) as a surgical intervention .  The patient's history has been reviewed, patient examined, no change in status, stable for surgery.  I have reviewed the patient's chart and labs.  Questions were answered to the patient's satisfaction.     Venita Lick. Russella Dar MD [facility]

## 2013-01-03 NOTE — Op Note (Signed)
[facility] [address] [place], [address]   ENDOSCOPY PROCEDURE REPORT  PATIENT: Leonard, Bradley  MR#: [id number] BIRTHDATE: [birth year] , 41  yrs. old GENDER: Male ENDOSCOPIST: Meryl Dare, MD, [facility] REFERRED BY:  Willey Blade, M.D. PROCEDURE DATE:  01/03/2013 PROCEDURE:  EGD, diagnostic ASA CLASS:     Class III INDICATIONS:  abdominal pain in upper left quadrant, anemia. MEDICATIONS: These medications were titrated to patient response per physician's verbal order, Fentanyl 75 mcg IV, and Versed TOPICAL ANESTHETIC: Cetacaine Spray DESCRIPTION OF PROCEDURE: After the risks benefits and alternatives of the procedure were thoroughly explained, informed consent was obtained.  The    endoscope was introduced through the mouth and advanced to the second portion of the duodenum  without limitations.  The instrument was slowly withdrawn as the mucosa was fully examined.  DUODENUM: Mild duodenal inflammation, small erosions and erythema were found in the duodenal bulb.  The duodenal mucosa showed no abnormalities in the 2nd part of the duodenum. STOMACH: The mucosa and folds of the stomach appeared normal. ESOPHAGUS: The mucosa of the esophagus appeared normal.  Retroflexed views revealed a small hiatal hernia.  The scope was then withdrawn from the patient and the procedure completed.  COMPLICATIONS: There were no complications.  ENDOSCOPIC IMPRESSION: 1.   Duodenitis 2.   Small hiatal hernia  RECOMMENDATIONS: 1.  Anti-reflux regimen 2.  Continue PPI daily  [Recall Date & Procedure] eSigned:  Meryl Dare, MD, [facility] 01/03/2013 11:41 AM

## 2013-01-04 ENCOUNTER — Encounter (HOSPITAL_COMMUNITY): Payer: Self-pay | Admitting: Gastroenterology

## 2013-01-04 LAB — TYPE AND SCREEN
Antibody Screen: POSITIVE
DAT, IgG: NEGATIVE

## 2013-01-04 LAB — CBC WITH DIFFERENTIAL/PLATELET
Basophils Absolute: 0 10*3/uL (ref 0.0–0.1)
Basophils Relative: 1 % (ref 0–1)
Hemoglobin: 10 g/dL — ABNORMAL LOW (ref 13.0–17.0)
Lymphocytes Relative: 34 % (ref 12–46)
Lymphs Abs: 2.3 10*3/uL (ref 0.7–4.0)
MCH: 27.9 pg (ref 26.0–34.0)
MCV: 79.7 fL (ref 78.0–100.0)
Monocytes Absolute: 0.7 10*3/uL (ref 0.1–1.0)
RBC: 3.59 MIL/uL — ABNORMAL LOW (ref 4.22–5.81)
WBC: 6.6 10*3/uL (ref 4.0–10.5)

## 2013-01-04 LAB — RETICULOCYTES
Retic Count, Absolute: 288.6 10*3/uL — ABNORMAL HIGH (ref 19.0–186.0)
Retic Ct Pct: 8.2 % — ABNORMAL HIGH (ref 0.4–3.1)

## 2013-01-04 LAB — CMV IGM: CMV IgM: <8 AU/mL (ref <30.00)

## 2013-01-04 MED ORDER — FOLIC ACID 1 MG PO TABS: 1.0000 mg | ORAL_TABLET | Freq: Every day | ORAL | Status: DC

## 2013-01-04 MED ORDER — PANTOPRAZOLE SODIUM 40 MG PO TBEC: 40.0000 mg | DELAYED_RELEASE_TABLET | Freq: Every day | ORAL | Status: DC

## 2013-01-04 NOTE — Progress Notes (Signed)
Pt is to be discharged home today. Pt is in NAD, IV is out, all paperwork has been reviewed/discussed with patient, and there are no questions/concerns at this time. Assessment is unchanged from this morning. Pt is to be accompanied downstairs by staff and family via wheelchair.  

## 2013-01-04 NOTE — Progress Notes (Signed)
[facility] Progress Note  Subjective:  Had BM this AM.  Was heme negative.  Feeling a little better overall.  Objective:  Vital signs in last 24 hours: Temp:  [98.1 F (36.7 C)-99 F (37.2 C)] 98.1 F (36.7 C) (02/07 0556) Pulse Rate:  [75-105] 82  (02/07 0556) Resp:  [10-18] 16  (02/07 0556) BP: (116-207)/(62-119) 135/77 mmHg (02/07 0556) SpO2:  [94 %-100 %] 99 % (02/07 0556) Last BM Date: 01/03/13 General:   Alert, Well-developed, in NAD Heart:  Regular rate and rhythm; no murmurs Pulm:  CTAB.  No W/R/R. Abdomen:  Soft, nontender and nondistended. Normal bowel sounds, without guarding, and without rebound.   Extremities:  Without edema. Neurologic:  Alert and  oriented x4;  grossly normal neurologically. Psych:  Alert and cooperative. Normal mood and affect.  Intake/Output from previous day: 02/06 0701 - 02/07 0700 In: 530 [P.O.:480; I.V.:50] Out: -   Lab Results:  Basename 01/04/13 0352 01/03/13 0400 01/02/13 0402  WBC 6.6 9.6 10.2  HGB 10.0* 9.8* 7.7*  HCT 28.6* 27.8* 21.6*  PLT 66* 62* 66*   BMET  Basename 01/02/13 0402  NA 134*  K 3.7  CL 99  CO2 26  GLUCOSE 126*  BUN 10  CREATININE 0.79  CALCIUM 8.6   LFT  Basename 01/02/13 0402  PROT 6.5  ALBUMIN 3.5  AST 34  ALT 38  ALKPHOS 50  BILITOT 1.2  BILIDIR --  IBILI --   Ct Abdomen Pelvis W Contrast  01/03/2013  *RADIOLOGY REPORT*  Clinical Data: Patient with sickle cell disease.  Progressively increasing left upper thigh and leg pain, progressing to the right and left abdomen.  CT ABDOMEN AND PELVIS WITH CONTRAST  Technique:  Multidetector CT imaging of the abdomen and pelvis was performed following the standard protocol during bolus administration of intravenous contrast.  Contrast: OMNIPAQUE IOHEXOL 300 MG/ML  SOLN, 50mL OMNIPAQUE IOHEXOL 300 MG/ML  SOLN  Comparison: 07/20/2007  Findings: There is coarse reticular and patchy airspace opacity posteriorly in the left lower lobe.  This  may all reflect atelectasis.  Infiltrate is possible.  The right lung bases clear. The heart is mildly enlarged.  Normal liver.  The spleen is enlarged measuring 17.5 cm x 7.2 cm by 16.7 cm.  No splenic masses or focal lesions are seen.  There are gallstones and otherwise unremarkable gallbladder.  No evidence of acute cholecystitis.  No bile duct dilation.  Normal pancreas.  There is thickening of the crux of the left adrenal gland.  This is stable from prior CT indicating a benign etiology.  Small cyst in the upper pole of the left kidney.  It measures 16.6 mm.  The kidneys otherwise normal.  Normal ureters and bladder.  No enlarged lymph nodes.  No abnormal fluid collections.  There are several sigmoid colon diverticula.  The bowels otherwise unremarkable.  A normal appendix is visualized.  There are advanced bony changes of sickle cell disease.  No osteolytic lesions or fractures.  When compared the prior study, the spleen is slightly larger and gallstones are new.  Left lung base opacity is new.  No other change.  IMPRESSION: Left lung base opacity which may reflect atelectasis, infiltrate or a combination.  Atelectasis is favored.  No acute findings in the abdomen or pelvis.  Splenomegaly, which is mildly increased when compared to the prior CT.  Cholelithiasis with no evidence of acute cholecystitis.  This is a new finding when compared the prior study.  Small  left renal cyst.  Advanced bony changes of sickle cell disease.   Original Report Authenticated By: Amie Portland, M.D.     Assessment / Recommendation: -Anemia: Normocytic with Hgb down 3-4 grams from baseline initially but responded well to 2 units of PRBC's. Can see anemia with [place], but determined by primary service that he is not hemolyzing. Complains of LUQ abdominal pain, which is likely secondary to splenomegaly.  EGD showed only duodenitis on 2/6 and CT scan of the abdomen and pelvis showed no other acute or pain causing abnormalities.  One  episode of rectal bleeding seen on the toilet paper at home recently (likely hemorrhoidal).  He is heme negative on once occasion here. 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 *No other evaluation is needed from a GI standpoint at this time.  Will sign off.     LOS: 4 days   ZEHR, JESSICA D.  01/04/2013, 8:45 AM  Pager number [phone]    I have taken an interval history, reviewed the chart and examined the patient. I agree with the Advanced Practitioner's note, impression and recommendations. PPI daily for mild GERD and duodenitis. Asymptomatic cholelithiasis, which has been previously noted. LUQ pain maybe related to spleen or musculoskeletal. Anemia with heme neg stool, further evaluation as indicated by primary service. No further GI evaluation needed at this time. Follow up with his PCP. GI signing off.  Venita Lick. Russella Dar MD Clementeen Graham

## 2013-01-04 NOTE — Discharge Summary (Signed)
[name] MRN: [id number] DOB/AGE: 1971-06-13 41 y.o.  Admit date: 12/31/2012 Discharge date: 01/04/2013  Primary Care Physician:  Willey Blade, MD   Discharge Diagnoses:   Patient Active Problem List  Diagnosis  . Gastritis and duodenitis  . Impotence of organic origin  . Osteoarthrosis, unspecified whether generalized or localized, unspecified site  . Pain in joint, site unspecified  . Avascular necrosis of left femoral head  . Erectile dysfunction  . Low testosterone  . Mixed restrictive and obstructive lung disease  . Thrombocytopenia  . Anemia  . Hemoglobin [place] with crisis  . Epigastric abdominal pain  . Fever  . Abdominal pain, left upper quadrant    DISCHARGE MEDICATION:   Medication List     As of 01/04/2013 10:59 AM    STOP taking these medications         calcium carbonate 500 MG chewable tablet   Commonly known as: TUMS - dosed in mg elemental calcium      ibuprofen 800 MG tablet   Commonly known as: ADVIL,MOTRIN      oxyCODONE-acetaminophen 5-325 MG per tablet   Commonly known as: PERCOCET/ROXICET      oxyCODONE-acetaminophen 7.5-325 MG per tablet   Commonly known as: PERCOCET      TAKE these medications         folic acid 1 MG tablet   Commonly known as: FOLVITE   Take 1 tablet (1 mg total) by mouth daily.      HYDROcodone-acetaminophen 5-325 MG per tablet   Commonly known as: NORCO/VICODIN   Take 1 tablet by mouth every 6 (six) hours as needed. For pain.      multivitamin with minerals Tabs   Take 1 tablet by mouth every morning.      pantoprazole 40 MG tablet   Commonly known as: PROTONIX   Take 1 tablet (40 mg total) by mouth daily.      Consults:  Dr. Sharmon Leyden   SIGNIFICANT DIAGNOSTIC STUDIES:  Dg Abd 1 View  01/01/2013  *RADIOLOGY REPORT*  Clinical Data: Left upper quadrant pain, history of sickle cell disease  ABDOMEN - 1 VIEW  Comparison: Ultrasound of the kidneys of 06/27/2012 and KUB prior upper GI dated 08/09/2010  Findings:  The bowel gas pattern is nonspecific.  No obstruction is seen.  The spleen appears enlarged, but stable compared to the KUB from 2011.  Bony changes of sickle cell disease are noted diffusely.  No opaque calculi are seen.  IMPRESSION:  1.  No bowel obstruction.  No opaque calculi. 2.  Stable splenomegaly. 3.  Diffuse bony changes of sickle cell disease.   Original Report Authenticated By: Dwyane Dee, M.D.    Ct Abdomen Pelvis W Contrast  01/03/2013  *RADIOLOGY REPORT*  Clinical Data: Patient with sickle cell disease.  Progressively increasing left upper thigh and leg pain, progressing to the right and left abdomen.  CT ABDOMEN AND PELVIS WITH CONTRAST  Technique:  Multidetector CT imaging of the abdomen and pelvis was performed following the standard protocol during bolus administration of intravenous contrast.  Contrast: OMNIPAQUE IOHEXOL 300 MG/ML  SOLN, 50mL OMNIPAQUE IOHEXOL 300 MG/ML  SOLN  Comparison: 07/20/2007  Findings: There is coarse reticular and patchy airspace opacity posteriorly in the left lower lobe.  This may all reflect atelectasis.  Infiltrate is possible.  The right lung bases clear. The heart is mildly enlarged.  Normal liver.  The spleen is enlarged measuring 17.5 cm x 7.2 cm by 16.7 cm.  No  splenic masses or focal lesions are seen.  There are gallstones and otherwise unremarkable gallbladder.  No evidence of acute cholecystitis.  No bile duct dilation.  Normal pancreas.  There is thickening of the crux of the left adrenal gland.  This is stable from prior CT indicating a benign etiology.  Small cyst in the upper pole of the left kidney.  It measures 16.6 mm.  The kidneys otherwise normal.  Normal ureters and bladder.  No enlarged lymph nodes.  No abnormal fluid collections.  There are several sigmoid colon diverticula.  The bowels otherwise unremarkable.  A normal appendix is visualized.  There are advanced bony changes of sickle cell disease.  No osteolytic lesions or fractures.   When compared the prior study, the spleen is slightly larger and gallstones are new.  Left lung base opacity is new.  No other change.  IMPRESSION: Left lung base opacity which may reflect atelectasis, infiltrate or a combination.  Atelectasis is favored.  No acute findings in the abdomen or pelvis.  Splenomegaly, which is mildly increased when compared to the prior CT.  Cholelithiasis with no evidence of acute cholecystitis.  This is a new finding when compared the prior study.  Small left renal cyst.  Advanced bony changes of sickle cell disease.   Original Report Authenticated By: Amie Portland, M.D.    Dg Chest Portable 1 View  12/21/2012  *RADIOLOGY REPORT*  Clinical Data: Sickle cell crisis.  PORTABLE CHEST - 1 VIEW  Comparison: 10/22/2012  Findings: Single view of the chest demonstrates clear lungs. Stable appearance of the heart and mediastinum.  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 There appears to be sclerosis in the right humeral head.  IMPRESSION: No acute cardiopulmonary disease.  Chronic deformity of the left shoulder.   Original Report Authenticated By: Richarda Overlie, M.D.     OTHER PROCEDURES: EGD ENDOSCOPIC IMPRESSION:  1. Duodenitis  2. Small hiatal hernia  RECOMMENDATIONS:  1. Anti-reflux regimen  2. Continue PPI daily    Recent Results (from the past 240 hour(s))  CULTURE, BLOOD (ROUTINE X 2)     Status: Normal (Preliminary result)   Collection Time   01/01/13  8:45 AM      Component Value Range Status Comment   Specimen Description BLOOD LEFT HAND   Final    Culture     Final    Value:        BLOOD CULTURE RECEIVED NO GROWTH TO DATE CULTURE WILL BE HELD FOR 5 DAYS BEFORE ISSUING A FINAL NEGATIVE REPORT   Report Status PENDING   Incomplete   CULTURE, BLOOD (ROUTINE X 2)     Status: Normal (Preliminary result)   Collection Time   01/01/13  8:50 AM      Component Value Range Status Comment   Specimen Description BLOOD LEFT ARM    Final    Culture     Final    Value:        BLOOD CULTURE RECEIVED NO GROWTH TO DATE CULTURE WILL BE HELD FOR 5 DAYS BEFORE ISSUING A FINAL NEGATIVE REPORT   Report Status PENDING   Incomplete   URINE CULTURE     Status: Normal   Collection Time   01/01/13 11:05 AM      Component Value Range Status Comment   Specimen Description URINE   Final    Culture  Setup Time 01/01/2013 16:17   Final    Colony Count NO  GROWTH   Final    Culture NO GROWTH   Final    Report Status 01/02/2013 FINAL   Final   CULTURE, BLOOD (ROUTINE X 2)     Status: Normal (Preliminary result)   Collection Time   01/02/13  5:24 PM      Component Value Range Status Comment   Specimen Description BLOOD LEFT ARM   Final    Culture  Setup Time 01/02/2013 23:32   Final    Culture     Final    Value:        BLOOD CULTURE RECEIVED NO GROWTH TO DATE CULTURE WILL BE HELD FOR 5 DAYS BEFORE ISSUING A FINAL NEGATIVE REPORT   Report Status PENDING   Incomplete   CULTURE, BLOOD (ROUTINE X 2)     Status: Normal (Preliminary result)   Collection Time   01/02/13  5:26 PM      Component Value Range Status Comment   Specimen Description BLOOD LEFT HAND   Final    Special Requests BOTTLES DRAWN AEROBIC ONLY 3CC   Final    Culture  Setup Time 01/02/2013 23:34   Final    Culture     Final    Value:        BLOOD CULTURE RECEIVED NO GROWTH TO DATE CULTURE WILL BE HELD FOR 5 DAYS BEFORE ISSUING A FINAL NEGATIVE REPORT   Report Status PENDING   Incomplete     BRIEF ADMITTING H & P: This is a 42 yr old African American male with [place]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 He rates his pain 8/10 in his thighs and legs describes as constant persistent throbbing pain. This pain in non radiating no associated symptoms. Abdominal pain felt made worst when used IS tried to reproduce the pain and was  unable to at this time. This is not a problem at this time. His baseline pain 2-3/10. He denies , chills, headache, dizziness, blurred vision, nausea, vomiting, abdominal pain, chest pain, SOB, or priapism. Note has seen bright red blood associated with BM with and without straining.     Hospital Course:  Present on Admission:  . Hemoglobin [place] with crisis: Pt was admitted from [facility] with Acute VOC as his pain was still poorly controlled and requiring IV analgesics. Pt also had inability to ambulate and gait abnormality secondary to the Acute VOC. This clinical course was progressive resolution of his acute pain as he was transitioned from IV  to oral medication. His gait abnormality resolved and pt was independent with ambulation at the time of discharge.   . Thrombocytopenia: Pt had thrombocytopenia without any use of heparinoids and no predisposing diagnosis to explain this phenomenon. He has been using Ibuprofen which can rarely lead to thrombocytopenia. He had no evidence of active bleeding and platelets remained above 50K. I am unclear as to the etiology. He was evaluated for viral causes and his serology showed evidence of previous CMV and EBV infections but IgM for both were negative.   . Anemia: Pt had a decrease in hemoglobin and hematocrit from initial hemoglobin of 11.7 to a nadir of 7.7. The patient received transfusion of 2 units of packed red blood cells and at time of discharge his hemoglobin was stable at 10. It is quite possible the patient could have been having a small  amount of splenic sequestration given its enlargement. Evaluation of his reticulocyte count initially showed an an inappropriately low response was level hemoglobin. However the time of discharge his reticulocyte production index was greater than 2 which is appropriate for level of hemoglobin. The patient also had fevers during this hospitalization there was concern that he may have had viral illnesses that may be  contributing to decreased reticulocytosis. He was evaluated for viral and infiltrated processes to the bone marrow. Parvo-B and Histoplasmosis are pending. However HIV, CMV IgM, EBV IgM were all negative. The patient did have positive EBV IgG and CMV IgG. The patient is to followup with an appointment on Tuesday, 01/09/2039 with his primary care physician at this time the parvo B virus should be followed up on, and patient should also have his hemoglobin reticulocyte count evaluated.   . Fever: The patient's fever is likely associated with his acute vaso-occlusive crisis. Blood cultures urine cultures were all negative today. He was evaluated for viral causes an acute viral infections were found to be normal today. Probably virus and histoplasmosis are still pending to be followed up on by his primary care physician on Tuesday, 01/09/2039   . Abdominal pain, left upper quadrant: The patient does have an enlarged spleen it is quite possible that he may have had a small degree of splenic sequestration leading to pain. The patient reports that he has intermittent pain in the left upper quadrant however this is not sufficient response to any medications. At the time of discharge the patient had no abdominal pain but is noted to have an enlarged spleen  Disposition and Follow-up:  The patient was discharged in stable condition. He was ambulatory at the time of discharge without any pain. He's to followup with his primary care physician an appointment already scheduled on 01/09/2013 at 2 PM.      Discharge Orders    Future Orders Please Complete By Expires   Diet general      Activity as tolerated - No restrictions         DISCHARGE EXAM:  General: Alert, awake, oriented x3, in no acute distress.  Vital Signs: BP 127/70, HR 93, T 98.2 F (36.8 C), temperature source Oral, RR 18, height 5\' 6"  (1.676 m), weight 69.8 kg (153 lb 14.1 oz), SpO2 100.00%. HEENT: [place]/AT PEERL, EOMI, anicteric  Heart:  Regular rate and rhythm, without murmurs, rubs, gallops.  Lungs: Clear to auscultation, no wheezing or rhonchi noted.  Abdomen: Mild epigastric tenderness, nondistended, positive bowel sounds, no masses mild splenomegaly noted.  Neuro: No focal neurological deficits noted cranial nerves II through XII grossly intact. Strength 5/5 in bilateral upper and lower extremities.  Musculoskeletal: No warm swelling or erythema around joints, no spinal tenderness noted.    Basename 01/02/13 0402  NA 134*  K 3.7  CL 99  CO2 26  GLUCOSE 126*  BUN 10  CREATININE 0.79  CALCIUM 8.6  MG --  PHOS --    Basename 01/02/13 0402  AST 34  ALT 38  ALKPHOS 50  BILITOT 1.2  PROT 6.5  ALBUMIN 3.5   No results found for this basename: LIPASE:2,AMYLASE:2 in the last 72 hours  Basename 01/04/13 0352 01/03/13 0400  WBC 6.6 9.6  NEUTROABS 3.5 7.1  HGB 10.0* 9.8*  HCT 28.6* 27.8*  MCV 79.7 79.4  PLT 66* 62*   Total time for discharge process including decision-making face-to-face time is greater than 30 minutes  Signed: Shaneen Reeser A. 01/04/2013, 10:59 AM

## 2013-01-05 LAB — PARVOVIRUS B19 ANTIBODY, IGG AND IGM: Parovirus B19 IgM Abs: 0.1 index (ref <0.9)

## 2013-01-07 LAB — CULTURE, BLOOD (ROUTINE X 2): Culture: NO GROWTH

## 2013-01-08 LAB — CULTURE, BLOOD (ROUTINE X 2): Culture: NO GROWTH

## 2013-01-22 ENCOUNTER — Other Ambulatory Visit: Payer: Self-pay | Admitting: Internal Medicine

## 2013-01-22 ENCOUNTER — Ambulatory Visit
Admission: RE | Admit: 2013-01-22 | Discharge: 2013-01-22 | Disposition: A | Discharge status: Home / Self Care | Payer: BC Managed Care – PPO | Source: Ambulatory Visit | Attending: Internal Medicine | Admitting: Internal Medicine

## 2013-01-22 DIAGNOSIS — R1012 Left upper quadrant pain: Secondary | ICD-10-CM

## 2013-01-22 DIAGNOSIS — R161 Splenomegaly, not elsewhere classified: Secondary | ICD-10-CM

## 2013-04-11 NOTE — Telephone Encounter (Signed)
See above note

## 2014-03-14 ENCOUNTER — Telehealth (HOSPITAL_COMMUNITY): Payer: Self-pay | Admitting: Hematology

## 2014-03-14 NOTE — Telephone Encounter (Signed)
Received call from answering service that patient has called multiple times about pain medications and being in crisis.  I reviewed chart.  This patient is currently followed by Dr. August Saucerean.  I called patient and explained he needed to call Dr. Diamantina Providenceean's office.  Patient verbalizes understanding, and said he was not aware that he needed to be a patient of Dr.Matthews before [facility].  I explained that for now, Dr. Ashley RoyaltyMatthews needs to be the primary care physician.  Patient verbalizes understanding, and will give Dr. August Saucerean a call.

## 2014-06-20 ENCOUNTER — Emergency Department (HOSPITAL_COMMUNITY)
Admission: EM | Admit: 2014-06-20 | Discharge: 2014-06-20 | Disposition: A | Discharge status: Home / Self Care | Payer: BC Managed Care – PPO | Attending: Emergency Medicine | Admitting: Emergency Medicine

## 2014-06-20 ENCOUNTER — Encounter (HOSPITAL_COMMUNITY): Payer: Self-pay | Admitting: Emergency Medicine

## 2014-06-20 ENCOUNTER — Emergency Department (HOSPITAL_COMMUNITY): Payer: BC Managed Care – PPO

## 2014-06-20 DIAGNOSIS — J4489 Other specified chronic obstructive pulmonary disease: Secondary | ICD-10-CM | POA: Insufficient documentation

## 2014-06-20 DIAGNOSIS — D5700 Hb-SS disease with crisis, unspecified: Secondary | ICD-10-CM

## 2014-06-20 DIAGNOSIS — F17200 Nicotine dependence, unspecified, uncomplicated: Secondary | ICD-10-CM | POA: Insufficient documentation

## 2014-06-20 DIAGNOSIS — K219 Gastro-esophageal reflux disease without esophagitis: Secondary | ICD-10-CM | POA: Insufficient documentation

## 2014-06-20 DIAGNOSIS — Z8659 Personal history of other mental and behavioral disorders: Secondary | ICD-10-CM | POA: Insufficient documentation

## 2014-06-20 DIAGNOSIS — Z79899 Other long term (current) drug therapy: Secondary | ICD-10-CM | POA: Insufficient documentation

## 2014-06-20 DIAGNOSIS — Z87448 Personal history of other diseases of urinary system: Secondary | ICD-10-CM | POA: Insufficient documentation

## 2014-06-20 DIAGNOSIS — J449 Chronic obstructive pulmonary disease, unspecified: Secondary | ICD-10-CM | POA: Insufficient documentation

## 2014-06-20 DIAGNOSIS — Z8739 Personal history of other diseases of the musculoskeletal system and connective tissue: Secondary | ICD-10-CM | POA: Insufficient documentation

## 2014-06-20 LAB — BASIC METABOLIC PANEL
ANION GAP: 13 (ref 5–15)
BUN: 14 mg/dL (ref 6–23)
CHLORIDE: 101 meq/L (ref 96–112)
CO2: 24 meq/L (ref 19–32)
CREATININE: 0.98 mg/dL (ref 0.50–1.35)
Calcium: 9.2 mg/dL (ref 8.4–10.5)
GFR calc non Af Amer: >90 mL/min (ref >90)
Glucose, Bld: 116 mg/dL — ABNORMAL HIGH (ref 70–99)
POTASSIUM: 3.7 meq/L (ref 3.7–5.3)
Sodium: 138 mEq/L (ref 137–147)

## 2014-06-20 LAB — RETICULOCYTES
RBC.: 3.55 MIL/uL — AB (ref 4.22–5.81)
RETIC COUNT ABSOLUTE: 145.6 10*3/uL (ref 19.0–186.0)
Retic Ct Pct: 4.1 % — ABNORMAL HIGH (ref 0.4–3.1)

## 2014-06-20 LAB — CBC
HEMATOCRIT: 28.4 % — AB (ref 39.0–52.0)
Hemoglobin: 9.9 g/dL — ABNORMAL LOW (ref 13.0–17.0)
MCH: 27.9 pg (ref 26.0–34.0)
MCHC: 34.9 g/dL (ref 30.0–36.0)
MCV: 80 fL (ref 78.0–100.0)
PLATELETS: 79 10*3/uL — AB (ref 150–400)
RBC: 3.55 MIL/uL — AB (ref 4.22–5.81)
RDW: 15.2 % (ref 11.5–15.5)
WBC: 6.2 10*3/uL (ref 4.0–10.5)

## 2014-06-20 MED ORDER — OXYCODONE-ACETAMINOPHEN 5-325 MG PO TABS: 2.0000 | ORAL_TABLET | Freq: Four times a day (QID) | ORAL | Status: DC | PRN

## 2014-06-20 MED ORDER — HYDROMORPHONE HCL PF 1 MG/ML IJ SOLN
1.0000 mg | Freq: Once | INTRAMUSCULAR | Status: AC
  Administered 2014-06-20: 1 mg via INTRAMUSCULAR

## 2014-06-20 MED ORDER — DIPHENHYDRAMINE HCL 50 MG/ML IJ SOLN
25.0000 mg | Freq: Once | INTRAMUSCULAR | Status: AC
  Administered 2014-06-20: 25 mg via INTRAMUSCULAR

## 2014-06-20 MED ORDER — HYDROMORPHONE HCL PF 1 MG/ML IJ SOLN
1.0000 mg | Freq: Once | INTRAMUSCULAR | Status: DC
  Filled 2014-06-20: qty 1

## 2014-06-20 MED ORDER — HYDROMORPHONE HCL PF 1 MG/ML IJ SOLN
1.0000 mg | Freq: Once | INTRAMUSCULAR | Status: AC
  Administered 2014-06-20: 1 mg via INTRAVENOUS
  Filled 2014-06-20: qty 1

## 2014-06-20 MED ORDER — DIPHENHYDRAMINE HCL 50 MG/ML IJ SOLN
25.0000 mg | Freq: Once | INTRAMUSCULAR | Status: DC
  Filled 2014-06-20: qty 1

## 2014-06-20 MED ORDER — SODIUM CHLORIDE 0.9 % IV BOLUS (SEPSIS)
1000.0000 mL | Freq: Once | INTRAVENOUS | Status: AC
  Administered 2014-06-20: 1000 mL via INTRAVENOUS

## 2014-06-20 MED ORDER — ACETAMINOPHEN 500 MG PO TABS
1000.0000 mg | ORAL_TABLET | Freq: Once | ORAL | Status: AC
  Administered 2014-06-20: 1000 mg via ORAL
  Filled 2014-06-20: qty 2

## 2014-06-20 MED ORDER — ONDANSETRON HCL 4 MG/2ML IJ SOLN
4.0000 mg | Freq: Once | INTRAMUSCULAR | Status: AC
  Administered 2014-06-20: 4 mg via INTRAVENOUS
  Filled 2014-06-20: qty 2

## 2014-06-20 NOTE — ED Notes (Signed)
Pt arrived to the ED with a complaint of sickle cell pain crisis.  Pt is experiencing pain in the left leg and lower back

## 2014-06-20 NOTE — ED Provider Notes (Signed)
CSN: [id number]     Arrival date & time 06/20/14  2022 History   First MD Initiated Contact with Patient 06/20/14 2032     Chief Complaint  Patient presents with  . Sickle Cell Pain Crisis     (Consider location/radiation/quality/duration/timing/severity/associated sxs/prior Treatment) Patient is a 43 y.o. male presenting with sickle cell pain. The history is provided by the patient.  Sickle Cell Pain Crisis Location:  Back and lower extremity Severity:  Severe Onset quality:  Gradual Similar to previous crisis episodes: yes (lower back pain similar, R leg pain new)   Timing:  Constant Progression:  Worsening Chronicity:  New History of pulmonary emboli: no   Context: not alcohol consumption, not change in medication, not non-compliance and not low humidity   Associated symptoms: no cough, no fever, no shortness of breath and no vomiting     Past Medical History  Diagnosis Date  . Gastritis and duodenitis   . Pain in joint, site unspecified   . Physical exam, annual 02/18/2010  . GERD (gastroesophageal reflux disease)   . Sickle cell anemia   . Avascular necrosis of left femoral head   . Osteoarthrosis, unspecified whether generalized or localized, unspecified site   . Mental disorder   . Erectile dysfunction   . Low testosterone   . Tobacco use   . Mixed restrictive and obstructive lung disease    Past Surgical History  Procedure Laterality Date  . Electrocardiogram  01/31/2011  . Upper gastrointestinal endoscopy  11/18/2010  . Esophagogastroduodenoscopy  01/03/2013    Procedure: ESOPHAGOGASTRODUODENOSCOPY (EGD);  Surgeon: Meryl DareMalcolm T Stark, MD,FACG;  Location: [facility];  Service: Endoscopy;  Laterality: N/A;   History reviewed. No pertinent family history. History  Substance Use Topics  . Smoking status: Current Every Day Smoker  . Smokeless tobacco: Never Used  . Alcohol Use: Yes     Comment: OCCASIONAL    Review of Systems  Constitutional: Negative for  fever.  Respiratory: Negative for cough and shortness of breath.   Gastrointestinal: Negative for vomiting and abdominal pain.  All other systems reviewed and are negative.     Allergies  Review of patient's allergies indicates no known allergies.  Home Medications   Prior to Admission medications   Medication Sig Start Date End Date Taking? Authorizing Provider  folic acid (FOLVITE) 1 MG tablet Take 1 tablet (1 mg total) by mouth daily. 01/04/13   Altha HarmMichelle A Matthews, MD  HYDROcodone-acetaminophen (NORCO/VICODIN) 5-325 MG per tablet Take 1 tablet by mouth every 6 (six) hours as needed. For pain.    Historical Provider, MD  Multiple Vitamin (MULTIVITAMIN WITH MINERALS) TABS Take 1 tablet by mouth every morning.    Historical Provider, MD  pantoprazole (PROTONIX) 40 MG tablet Take 1 tablet (40 mg total) by mouth daily. 01/04/13   Altha HarmMichelle A Matthews, MD   BP 165/70  Pulse 90  Temp(Src) 99.5 F (37.5 C) (Oral)  Resp 22  SpO2 100% Physical Exam  Vitals reviewed. Constitutional: He is oriented to person, place, and time. He appears well-developed and well-nourished. No distress.  HENT:  Head: Normocephalic and atraumatic.  Mouth/Throat: Oropharynx is clear and moist. No oropharyngeal exudate.  Eyes: EOM are normal. Pupils are equal, round, and reactive to light.  Neck: Normal range of motion. Neck supple.  Cardiovascular: Normal rate and regular rhythm.  Exam reveals no friction rub.   No murmur heard. Pulmonary/Chest: Effort normal and breath sounds normal. No respiratory distress. He has no wheezes. He has no  rales.  Abdominal: He exhibits no distension. There is no tenderness. There is no rebound.  Musculoskeletal: Normal range of motion. He exhibits no edema.  Neurological: He is alert and oriented to person, place, and time.  Skin: He is not diaphoretic.    ED Course  Procedures (including critical care time) Labs Review Labs Reviewed  CBC - Abnormal; Notable for the  following:    RBC 3.55 (*)    Hemoglobin 9.9 (*)    HCT 28.4 (*)    Platelets 79 (*)    All other components within normal limits  BASIC METABOLIC PANEL - Abnormal; Notable for the following:    Glucose, Bld 116 (*)    All other components within normal limits  RETICULOCYTES - Abnormal; Notable for the following:    Retic Ct Pct 4.1 (*)    RBC. 3.55 (*)    All other components within normal limits    Imaging Review Dg Pelvis 1-2 Views  06/20/2014   CLINICAL DATA:  Sickle cell pain crisis.  Right hip pain.  EXAM: PELVIS - 1-2 VIEW  COMPARISON:  Abdominal radiograph performed 01/01/2013, and CT of the abdomen and pelvis performed 01/03/2013  FINDINGS: There is relatively stable sclerotic change throughout the visualized osseous structures. There is no evidence of cortical collapse or significant degenerative change. The visualized joint spaces are preserved. The sacroiliac joints are otherwise grossly unremarkable.  The visualized bowel gas pattern is grossly unremarkable. No significant soft tissue abnormalities are characterized on radiograph.  IMPRESSION: Relatively stable sclerotic change throughout the visualized osseous structures. No evidence of cortical collapse or significant degenerative change. No evidence of fracture or dislocation.   Electronically Signed   By: Roanna Raider M.D.   On: 06/20/2014 21:38     EKG Interpretation None      MDM   Final diagnoses:  Sickle cell pain crisis    10M with hx of SCD presents with lower back pain and R leg pain. Lower back pain similar to prior pain crises. No fevers. No known triggers.  R leg with normal strength and sensation. No large joint effusion appreciated. Will check labs, give pain meds. R hip xray ordered for persistent R hip pain - normal. Feeling better after 3 doses of pain meds. Labs ok. Given option to stay for continued IV pain meds, he stated he'd rather go home and f/u with his doctor on Monday. Given small amount of  percocet. Stable for discharge.   Dagmar Hait, MD 06/20/14 2322

## 2014-06-20 NOTE — Discharge Instructions (Signed)
Sickle Cell Anemia, Adult °Sickle cell anemia is a condition in which red blood cells have an abnormal "sickle" shape. This abnormal shape shortens the cells' life span, which results in a lower than normal concentration of red blood cells in the blood. The sickle shape also causes the cells to clump together and block free blood flow through the blood vessels. As a result, the tissues and organs of the body do not receive enough oxygen. Sickle cell anemia causes organ damage and pain and increases the risk of infection. °CAUSES  °Sickle cell anemia is a genetic disorder. Those who receive two copies of the gene have the condition, and those who receive one copy have the trait. °RISK FACTORS °The sickle cell gene is most common in people whose families originated in [place]. Other areas of the globe where sickle cell trait occurs include the Mediterranean, [place] and [place], the [place], and the [place].  °SIGNS AND SYMPTOMS °· Pain, especially in the extremities, back, chest, or abdomen (common). The pain may start suddenly or may develop following an illness, especially if there is dehydration. Pain can also occur due to overexertion or exposure to extreme temperature changes. °· Frequent severe bacterial infections, especially certain types of pneumonia and meningitis. °· Pain and swelling in the hands and feet. °· Decreased activity.   °· Loss of appetite.   °· Change in behavior. °· Headaches. °· Seizures. °· Shortness of breath or difficulty breathing. °· Vision changes. °· Skin ulcers. °Those with the trait may not have symptoms or they may have mild symptoms.  °DIAGNOSIS  °Sickle cell anemia is diagnosed with blood tests that demonstrate the genetic trait. It is often diagnosed during the newborn period, due to mandatory testing nationwide. A variety of blood tests, X-rays, CT scans, MRI scans, ultrasounds, and lung function tests may also be done to monitor the condition. °TREATMENT  °Sickle  cell anemia may be treated with: °· Medicines. You may be given pain medicines, antibiotic medicines (to treat and prevent infections) or medicines to increase the production of certain types of hemoglobin. °· Fluids. °· Oxygen. °· Blood transfusions. °HOME CARE INSTRUCTIONS  °· Drink enough fluid to keep your urine clear or pale yellow. Increase your fluid intake in hot weather and during exercise. °· Do not smoke. Smoking lowers oxygen levels in the blood.   °· Only take over-the-counter or prescription medicines for pain, fever, or discomfort as directed by your health care provider. °· Take antibiotics as directed by your health care provider. Make sure you finish them it even if you start to feel better.   °· Take supplements as directed by your health care provider.   °· Consider wearing a medical alert bracelet. This tells anyone caring for you in an emergency of your condition.   °· When traveling, keep your medical information, health care provider's names, and the medicines you take with you at all times.   °· If you develop a fever, do not take medicines to reduce the fever right away. This could cover up a problem that is developing. Notify your health care provider. °· Keep all follow-up appointments with your health care provider. Sickle cell anemia requires regular medical care. °SEEK MEDICAL CARE IF: ° You have a fever. °SEEK IMMEDIATE MEDICAL CARE IF:  °· You feel dizzy or faint.   °· You have new abdominal pain, especially on the left side near the stomach area.   °· You develop a persistent, often uncomfortable and painful penile erection (priapism). If this is not treated immediately it   will lead to impotence.   °· You have numbness your arms or legs or you have a hard time moving them.   °· You have a hard time with speech.   °· You have a fever or persistent symptoms for more than 2-3 days.   °· You have a fever and your symptoms suddenly get worse.   °· You have signs or symptoms of infection.  These include:   °¨ Chills.   °¨ Abnormal tiredness (lethargy).   °¨ Irritability.   °¨ Poor eating.   °¨ Vomiting.   °· You develop pain that is not helped with medicine.   °· You develop shortness of breath. °· You have pain in your chest.   °· You are coughing up pus-like or bloody sputum.   °· You develop a stiff neck. °· Your feet or hands swell or have pain. °· Your abdomen appears bloated. °· You develop joint pain. °MAKE SURE YOU: °· Understand these instructions. °Document Released: 02/22/2006 Document Revised: 03/31/2014 Document Reviewed: 06/26/2013 °ExitCare® Patient Information ©2015 [employer]. This information is not intended to replace advice given to you by your health care provider. Make sure you discuss any questions you have with your health care provider. ° °

## 2015-04-13 ENCOUNTER — Other Ambulatory Visit: Payer: Self-pay | Admitting: Internal Medicine

## 2015-04-13 DIAGNOSIS — R1032 Left lower quadrant pain: Secondary | ICD-10-CM

## 2015-04-13 DIAGNOSIS — R161 Splenomegaly, not elsewhere classified: Secondary | ICD-10-CM

## 2015-04-15 ENCOUNTER — Ambulatory Visit
Admission: RE | Admit: 2015-04-15 | Discharge: 2015-04-15 | Disposition: A | Discharge status: Home / Self Care | Payer: BLUE CROSS/BLUE SHIELD | Source: Ambulatory Visit | Attending: Internal Medicine | Admitting: Internal Medicine

## 2015-04-15 DIAGNOSIS — R1032 Left lower quadrant pain: Secondary | ICD-10-CM

## 2015-04-15 DIAGNOSIS — R161 Splenomegaly, not elsewhere classified: Secondary | ICD-10-CM

## 2015-04-15 MED ORDER — IOHEXOL 300 MG/ML  SOLN
100.0000 mL | Freq: Once | INTRAMUSCULAR | Status: AC | PRN
  Administered 2015-04-15: 100 mL via INTRAVENOUS

## 2016-03-15 ENCOUNTER — Encounter (HOSPITAL_COMMUNITY): Payer: Self-pay | Admitting: *Deleted

## 2016-03-15 ENCOUNTER — Encounter (HOSPITAL_COMMUNITY): Payer: Self-pay

## 2016-03-15 ENCOUNTER — Emergency Department (HOSPITAL_COMMUNITY)
Admission: EM | Admit: 2016-03-15 | Discharge: 2016-03-15 | Disposition: A | Discharge status: Home / Self Care | Payer: PRIVATE HEALTH INSURANCE | Attending: Emergency Medicine | Admitting: Emergency Medicine

## 2016-03-15 ENCOUNTER — Non-Acute Institutional Stay (HOSPITAL_COMMUNITY)
Admission: AD | Admit: 2016-03-15 | Discharge: 2016-03-15 | Disposition: A | Discharge status: Home / Self Care | Payer: PRIVATE HEALTH INSURANCE | Source: Ambulatory Visit | Attending: Internal Medicine | Admitting: Internal Medicine

## 2016-03-15 DIAGNOSIS — D5700 Hb-SS disease with crisis, unspecified: Secondary | ICD-10-CM | POA: Diagnosis present

## 2016-03-15 DIAGNOSIS — Z8659 Personal history of other mental and behavioral disorders: Secondary | ICD-10-CM | POA: Insufficient documentation

## 2016-03-15 DIAGNOSIS — Z79899 Other long term (current) drug therapy: Secondary | ICD-10-CM | POA: Insufficient documentation

## 2016-03-15 DIAGNOSIS — J449 Chronic obstructive pulmonary disease, unspecified: Secondary | ICD-10-CM | POA: Diagnosis not present

## 2016-03-15 DIAGNOSIS — M79604 Pain in right leg: Secondary | ICD-10-CM | POA: Diagnosis present

## 2016-03-15 DIAGNOSIS — F17200 Nicotine dependence, unspecified, uncomplicated: Secondary | ICD-10-CM | POA: Diagnosis not present

## 2016-03-15 DIAGNOSIS — M1990 Unspecified osteoarthritis, unspecified site: Secondary | ICD-10-CM | POA: Diagnosis not present

## 2016-03-15 DIAGNOSIS — Z8719 Personal history of other diseases of the digestive system: Secondary | ICD-10-CM | POA: Insufficient documentation

## 2016-03-15 DIAGNOSIS — Z8639 Personal history of other endocrine, nutritional and metabolic disease: Secondary | ICD-10-CM | POA: Diagnosis not present

## 2016-03-15 LAB — COMPREHENSIVE METABOLIC PANEL
ALBUMIN: 4.8 g/dL (ref 3.5–5.0)
ALT: 23 U/L (ref 17–63)
AST: 24 U/L (ref 15–41)
Alkaline Phosphatase: 47 U/L (ref 38–126)
Anion gap: 8 (ref 5–15)
BUN: 12 mg/dL (ref 6–20)
CHLORIDE: 109 mmol/L (ref 101–111)
CO2: 24 mmol/L (ref 22–32)
Calcium: 9.3 mg/dL (ref 8.9–10.3)
Creatinine, Ser: 1 mg/dL (ref 0.61–1.24)
GFR calc Af Amer: >60 mL/min (ref >60)
GFR calc non Af Amer: >60 mL/min (ref >60)
GLUCOSE: 115 mg/dL — AB (ref 65–99)
POTASSIUM: 4 mmol/L (ref 3.5–5.1)
Sodium: 141 mmol/L (ref 135–145)
Total Bilirubin: 1.2 mg/dL (ref 0.3–1.2)
Total Protein: 7.8 g/dL (ref 6.5–8.1)

## 2016-03-15 LAB — CBC WITH DIFFERENTIAL/PLATELET
BASOS PCT: 0 %
Basophils Absolute: 0 10*3/uL (ref 0.0–0.1)
EOS PCT: 0 %
Eosinophils Absolute: 0 10*3/uL (ref 0.0–0.7)
HEMATOCRIT: 32.5 % — AB (ref 39.0–52.0)
HEMOGLOBIN: 11.7 g/dL — AB (ref 13.0–17.0)
LYMPHS ABS: 0.8 10*3/uL (ref 0.7–4.0)
Lymphocytes Relative: 11 %
MCH: 28.3 pg (ref 26.0–34.0)
MCHC: 36 g/dL (ref 30.0–36.0)
MCV: 78.7 fL (ref 78.0–100.0)
MONOS PCT: 5 %
Monocytes Absolute: 0.4 10*3/uL (ref 0.1–1.0)
NEUTROS ABS: 6.3 10*3/uL (ref 1.7–7.7)
Neutrophils Relative %: 84 %
Platelets: 103 10*3/uL — ABNORMAL LOW (ref 150–400)
RBC: 4.13 MIL/uL — ABNORMAL LOW (ref 4.22–5.81)
RDW: 14.9 % (ref 11.5–15.5)
WBC: 7.5 10*3/uL (ref 4.0–10.5)

## 2016-03-15 LAB — RETICULOCYTES
RBC.: 4.12 MIL/uL — ABNORMAL LOW (ref 4.22–5.81)
Retic Count, Absolute: 177.2 10*3/uL (ref 19.0–186.0)
Retic Ct Pct: 4.3 % — ABNORMAL HIGH (ref 0.4–3.1)

## 2016-03-15 MED ORDER — DEXTROSE-NACL 5-0.45 % IV SOLN
INTRAVENOUS | Status: DC
  Administered 2016-03-15: 12:00:00 via INTRAVENOUS

## 2016-03-15 MED ORDER — HYDROMORPHONE HCL 2 MG/ML IJ SOLN
1.7500 mg | Freq: Once | INTRAMUSCULAR | Status: AC
  Administered 2016-03-15: 1.8 mg via INTRAVENOUS
  Filled 2016-03-15: qty 1

## 2016-03-15 MED ORDER — ONDANSETRON HCL 4 MG/2ML IJ SOLN
4.0000 mg | INTRAMUSCULAR | Status: DC | PRN
Start: 2016-03-15 — End: 2016-03-15

## 2016-03-15 MED ORDER — HYDROMORPHONE HCL 2 MG/ML IJ SOLN: 2.0000 mg | Freq: Once | INTRAMUSCULAR | Status: DC

## 2016-03-15 MED ORDER — HYDROMORPHONE HCL 1 MG/ML IJ SOLN
1.0000 mg | Freq: Once | INTRAMUSCULAR | Status: AC
  Administered 2016-03-15: 1 mg via INTRAVENOUS
  Filled 2016-03-15: qty 1

## 2016-03-15 MED ORDER — SODIUM CHLORIDE 0.45 % IV SOLN
INTRAVENOUS | Status: DC
  Administered 2016-03-15: 10:00:00 via INTRAVENOUS

## 2016-03-15 MED ORDER — POLYETHYLENE GLYCOL 3350 17 G PO PACK: 17.0000 g | PACK | Freq: Every day | ORAL | Status: DC | PRN

## 2016-03-15 MED ORDER — SENNOSIDES-DOCUSATE SODIUM 8.6-50 MG PO TABS
1.0000 | ORAL_TABLET | Freq: Two times a day (BID) | ORAL | Status: DC
  Administered 2016-03-15: 1 via ORAL
  Filled 2016-03-15: qty 1

## 2016-03-15 MED ORDER — ONDANSETRON HCL 4 MG/2ML IJ SOLN
4.0000 mg | Freq: Once | INTRAMUSCULAR | Status: AC
  Administered 2016-03-15: 4 mg via INTRAVENOUS
  Filled 2016-03-15: qty 2

## 2016-03-15 MED ORDER — ONDANSETRON HCL 4 MG/2ML IJ SOLN: 4.0000 mg | INTRAMUSCULAR | Status: DC | PRN

## 2016-03-15 MED ORDER — KETOROLAC TROMETHAMINE 30 MG/ML IJ SOLN
30.0000 mg | Freq: Four times a day (QID) | INTRAMUSCULAR | Status: DC
  Administered 2016-03-15: 30 mg via INTRAVENOUS
  Filled 2016-03-15: qty 1

## 2016-03-15 MED ORDER — ONDANSETRON HCL 4 MG PO TABS: 4.0000 mg | ORAL_TABLET | ORAL | Status: DC | PRN

## 2016-03-15 MED ORDER — KETOROLAC TROMETHAMINE 30 MG/ML IJ SOLN
30.0000 mg | INTRAMUSCULAR | Status: AC
  Administered 2016-03-15: 30 mg via INTRAVENOUS
  Filled 2016-03-15: qty 1

## 2016-03-15 MED ORDER — HYDROMORPHONE HCL 2 MG/ML IJ SOLN
2.0000 mg | INTRAMUSCULAR | Status: DC | PRN
  Administered 2016-03-15 (×2): 2 mg via INTRAVENOUS
  Filled 2016-03-15 (×2): qty 1

## 2016-03-15 MED ORDER — FOLIC ACID 1 MG PO TABS
1.0000 mg | ORAL_TABLET | Freq: Every day | ORAL | Status: DC
  Administered 2016-03-15: 1 mg via ORAL
  Filled 2016-03-15: qty 1

## 2016-03-15 NOTE — Progress Notes (Signed)
[facility] via w/c with c/o pain in his right leg. Pt stated pain was 8/10. Pt's IV was still intact and patent with good blood return. Pt was treated with IVP Dilaudid and Toradol and IV fluids. Pt also used heat to that right leg. Pt's pain was down to 4 at discharge.Was able to walk more efficiently. Pt voiced understanding of d/c instructions. Pt was alert, oriented and ambulatory at discharge.Discharged with wife.

## 2016-03-15 NOTE — Discharge Instructions (Signed)
Sickle Cell Anemia, Adult Sickle cell anemia is a condition in which red blood cells have an abnormal "sickle" shape. This abnormal shape shortens the cells' life span, which results in a lower than normal concentration of red blood cells in the blood. The sickle shape also causes the cells to clump together and block free blood flow through the blood vessels. As a result, the tissues and organs of the body do not receive enough oxygen. Sickle cell anemia causes organ damage and pain and increases the risk of infection. CAUSES  Sickle cell anemia is a genetic disorder. Those who receive two copies of the gene have the condition, and those who receive one copy have the trait. RISK FACTORS The sickle cell gene is most common in people whose families originated in [place]. Other areas of the globe where sickle cell trait occurs include the Mediterranean, [place] and [place], the [place], and the [place].  SIGNS AND SYMPTOMS  Pain, especially in the extremities, back, chest, or abdomen (common). The pain may start suddenly or may develop following an illness, especially if there is dehydration. Pain can also occur due to overexertion or exposure to extreme temperature changes.  Frequent severe bacterial infections, especially certain types of pneumonia and meningitis.  Pain and swelling in the hands and feet.  Decreased activity.   Loss of appetite.   Change in behavior.  Headaches.  Seizures.  Shortness of breath or difficulty breathing.  Vision changes.  Skin ulcers. Those with the trait may not have symptoms or they may have mild symptoms.  DIAGNOSIS  Sickle cell anemia is diagnosed with blood tests that demonstrate the genetic trait. It is often diagnosed during the newborn period, due to mandatory testing nationwide. A variety of blood tests, X-rays, CT scans, MRI scans, ultrasounds, and lung function tests may also be done to monitor the condition. TREATMENT  Sickle  cell anemia may be treated with:  Medicines. You may be given pain medicines, antibiotic medicines (to treat and prevent infections) or medicines to increase the production of certain types of hemoglobin.  Fluids.  Oxygen.  Blood transfusions. HOME CARE INSTRUCTIONS   Drink enough fluid to keep your urine clear or pale yellow. Increase your fluid intake in hot weather and during exercise.  Do not smoke. Smoking lowers oxygen levels in the blood.   Only take over-the-counter or prescription medicines for pain, fever, or discomfort as directed by your health care provider.  Take antibiotics as directed by your health care provider. Make sure you finish them it even if you start to feel better.   Take supplements as directed by your health care provider.   Consider wearing a medical alert bracelet. This tells anyone caring for you in an emergency of your condition.   When traveling, keep your medical information, health care provider's names, and the medicines you take with you at all times.   If you develop a fever, do not take medicines to reduce the fever right away. This could cover up a problem that is developing. Notify your health care provider.  Keep all follow-up appointments with your health care provider. Sickle cell anemia requires regular medical care. SEEK MEDICAL CARE IF: You have a fever. SEEK IMMEDIATE MEDICAL CARE IF:   You feel dizzy or faint.   You have new abdominal pain, especially on the left side near the stomach area.   You develop a persistent, often uncomfortable and painful penile erection (priapism). If this is not treated immediately it   will lead to impotence.   You have numbness your arms or legs or you have a hard time moving them.   You have a hard time with speech.   You have a fever or persistent symptoms for more than 2-3 days.   You have a fever and your symptoms suddenly get worse.   You have signs or symptoms of infection.  These include:   Chills.   Abnormal tiredness (lethargy).   Irritability.   Poor eating.   Vomiting.   You develop pain that is not helped with medicine.   You develop shortness of breath.  You have pain in your chest.   You are coughing up pus-like or bloody sputum.   You develop a stiff neck.  Your feet or hands swell or have pain.  Your abdomen appears bloated.  You develop joint pain. MAKE SURE YOU:  Understand these instructions.   This information is not intended to replace advice given to you by your health care provider. Make sure you discuss any questions you have with your health care provider.   Document Released: 02/22/2006 Document Revised: 12/05/2014 Document Reviewed: 06/26/2013 Elsevier Interactive Patient Education 2016 [employer]  

## 2016-03-15 NOTE — ED Provider Notes (Signed)
CSN: [id number]     Arrival date & time 03/15/16  [id number] History   First MD Initiated Contact with Patient 03/15/16 [phone]     Chief Complaint  Patient presents with  . Sickle Cell Pain Crisis     HPI  Patient presents for evaluation of right leg pain that he believes is from sickle cell. Has a history of sickle cell anemia, SCD. Infrequent exacerbations. Falls with Dr. August Saucerean. Occasional use muscle relaxants Roxicodone at home. Most exacerbations uses ibuprofen. He is not a frequent menstrual to the emergency room. Typical symptoms in the right leg starting about 2 days ago. Refractory to Motrin at home he presents here. No fevers or chills. Chest pain or shortness of breath.    Past Medical History  Diagnosis Date  . Gastritis and duodenitis   . Pain in joint, site unspecified   . Physical exam, annual 02/18/2010  . GERD (gastroesophageal reflux disease)   . Avascular necrosis of left femoral head (HCC)   . Osteoarthrosis, unspecified whether generalized or localized, unspecified site   . Mental disorder   . Erectile dysfunction   . Low testosterone   . Tobacco use   . Mixed restrictive and obstructive lung disease (HCC)   . Sickle cell anemia [facility](HCC)    Past Surgical History  Procedure Laterality Date  . Electrocardiogram  01/31/2011  . Upper gastrointestinal endoscopy  11/18/2010  . Esophagogastroduodenoscopy  01/03/2013    Procedure: ESOPHAGOGASTRODUODENOSCOPY (EGD);  Surgeon: Meryl DareMalcolm T Stark, MD,FACG;  Location: [facility];  Service: Endoscopy;  Laterality: N/A;   History reviewed. No pertinent family history. Social History  Substance Use Topics  . Smoking status: Current Every Day Smoker  . Smokeless tobacco: Never Used  . Alcohol Use: Yes     Comment: OCCASIONAL    Review of Systems  Constitutional: Negative for fever, chills, diaphoresis, appetite change and fatigue.  HENT: Negative for mouth sores, sore throat and trouble swallowing.   Eyes: Negative for visual  disturbance.  Respiratory: Negative for cough, chest tightness, shortness of breath and wheezing.   Cardiovascular: Negative for chest pain.  Gastrointestinal: Negative for nausea, vomiting, abdominal pain, diarrhea and abdominal distention.  Endocrine: Negative for polydipsia, polyphagia and polyuria.  Genitourinary: Negative for dysuria, frequency and hematuria.  Musculoskeletal: Negative for gait problem.  Skin: Negative for color change, pallor and rash.  Neurological: Negative for dizziness, syncope, light-headedness and headaches.  Hematological: Does not bruise/bleed easily.  Psychiatric/Behavioral: Negative for behavioral problems and confusion.      Allergies  Review of patient's allergies indicates no known allergies.  Home Medications   Prior to Admission medications   Medication Sig Start Date End Date Taking? Authorizing Provider  Cholecalciferol (VITAMIN D PO) Take 1 tablet by mouth daily.   Yes Historical Provider, MD  ferrous sulfate 325 (65 FE) MG tablet Take 325 mg by mouth daily with breakfast.   Yes Historical Provider, MD  folic acid (FOLVITE) 1 MG tablet Take 1 tablet (1 mg total) by mouth daily. 01/04/13  Yes Altha HarmMichelle A Matthews, MD  ibuprofen (ADVIL,MOTRIN) 800 MG tablet Take 800 mg by mouth every 8 (eight) hours as needed for moderate pain.   Yes Historical Provider, MD  Omega-3 Fatty Acids (FISH OIL PO) Take 1 capsule by mouth daily.   Yes Historical Provider, MD   BP 147/82 mmHg  Pulse 88  Temp(Src) 98.5 F (36.9 C) (Oral)  Resp 20  Ht 5\' 6"  (1.676 m)  Wt 158 lb (71.668 kg)  BMI 25.51 kg/m2  SpO2 100% Physical Exam  Constitutional: He is oriented to person, place, and time. He appears well-developed and well-nourished. No distress.  HENT:  Head: Normocephalic.  Eyes: Conjunctivae are normal. Pupils are equal, round, and reactive to light. No scleral icterus.  Neck: Normal range of motion. Neck supple. No thyromegaly present.  Cardiovascular: Normal  rate and regular rhythm.  Exam reveals no gallop and no friction rub.   No murmur heard. Pulmonary/Chest: Effort normal and breath sounds normal. No respiratory distress. He has no wheezes. He has no rales.  Abdominal: Soft. Bowel sounds are normal. He exhibits no distension. There is no tenderness. There is no rebound.  Musculoskeletal: Normal range of motion.       Legs: Soft compartments. Complains of diffuse tenderness. Well-perfused distally. Normal movement distally.  Neurological: He is alert and oriented to person, place, and time.  Skin: Skin is warm and dry. No rash noted.  Psychiatric: He has a normal mood and affect. His behavior is normal.    ED Course  Procedures (including critical care time) Labs Review Labs Reviewed  COMPREHENSIVE METABOLIC PANEL - Abnormal; Notable for the following:    Glucose, Bld 115 (*)    All other components within normal limits  CBC WITH DIFFERENTIAL/PLATELET - Abnormal; Notable for the following:    RBC 4.13 (*)    Hemoglobin 11.7 (*)    HCT 32.5 (*)    All other components within normal limits  RETICULOCYTES - Abnormal; Notable for the following:    Retic Ct Pct 4.3 (*)    RBC. 4.12 (*)    All other components within normal limits    Imaging Review No results found. I have personally reviewed and evaluated these images and lab results as part of my medical decision-making.   EKG Interpretation None      MDM   Final diagnoses:  Vasoocclusive sickle cell crisis [facility])    Patient discussed with the physician at the sickle cell the hospital. They will come to transfer him for further care including fluid and pain control at today hospital.    Rolland Porter, MD 03/15/16 1047

## 2016-03-15 NOTE — H&P (Signed)
[facility] History and Physical  Ocean Schildt WUJ:[id number] DOB: Apr 09, 1971 DOA: 03/15/2016  PCP: Gwenyth Bender, MD   Chief Complaint: No chief complaint on file.   HPI: [name] is a 45 y.o. male with history of sickle cell anemia, avascular necrosis of the left femoral head, GERD and possible osteoarthritis, presented earlier this morning to the emergency department with right leg pain that he believes is from his sickle cell disease. Patient came back from work yesterday and started feeling pain on his right leg which gradually got worse, and his home pain medication was not providing sustained relief which include ibuprofen and oxycodone. Patient infrequently uses the ED, his last visit was in 2015 for sickle cell pain crisis. This symptom is typical of his crisis that most of the time he can manage it at home but this was slightly worse than usual. Patient denies any fever, no chills, no chest pain, no shortness of breath. No joint swelling. No redness. Patient has had 2 doses of IV Dilaudid in the ED, his pain is still at 8. He is here with his wife.  Systemic Review: General: The patient denies anorexia, fever, weight loss Cardiac: Denies chest pain, syncope, palpitations, pedal edema  Respiratory: Denies cough, shortness of breath, wheezing GI: Denies severe indigestion/heartburn, abdominal pain, nausea, vomiting, diarrhea and constipation GU: Denies hematuria, incontinence, dysuria  Skin: Denies suspicious skin lesions Neurologic: Denies focal weakness or numbness, change in vision  Past Medical History  Diagnosis Date  . Gastritis and duodenitis   . Pain in joint, site unspecified   . Physical exam, annual 02/18/2010  . GERD (gastroesophageal reflux disease)   . Avascular necrosis of left femoral head (HCC)   . Osteoarthrosis, unspecified whether generalized or localized, unspecified site   . Mental disorder   . Erectile dysfunction   . Low  testosterone   . Tobacco use   . Mixed restrictive and obstructive lung disease (HCC)   . Sickle cell anemia [facility])     Past Surgical History  Procedure Laterality Date  . Electrocardiogram  01/31/2011  . Upper gastrointestinal endoscopy  11/18/2010  . Esophagogastroduodenoscopy  01/03/2013    Procedure: ESOPHAGOGASTRODUODENOSCOPY (EGD);  Surgeon: Meryl Dare, MD,FACG;  Location: [facility];  Service: Endoscopy;  Laterality: N/A;    No Known Allergies  History reviewed. No pertinent family history.    Prior to Admission medications   Medication Sig Start Date End Date Taking? Authorizing Provider  Cholecalciferol (VITAMIN D PO) Take 1 tablet by mouth daily.   Yes Historical Provider, MD  ferrous sulfate 325 (65 FE) MG tablet Take 325 mg by mouth daily with breakfast.   Yes Historical Provider, MD  folic acid (FOLVITE) 1 MG tablet Take 1 tablet (1 mg total) by mouth daily. 01/04/13  Yes Altha Harm, MD  ibuprofen (ADVIL,MOTRIN) 800 MG tablet Take 800 mg by mouth every 8 (eight) hours as needed for moderate pain.   Yes Historical Provider, MD  Omega-3 Fatty Acids (FISH OIL PO) Take 1 capsule by mouth daily.   Yes Historical Provider, MD     Physical Exam: Filed Vitals:   03/15/16 1123  BP: 133/70  Pulse: 72  Temp: 99.4 F (37.4 C)  TempSrc: Oral  Resp: 20  Height:  (1.676 m)  Weight: 158 lb (71.668 kg)  SpO2: 99%    General: Alert, awake, afebrile, anicteric, not in obvious distress HEENT: Normocephalic and Atraumatic, Mucous membranes pink  PERRLA; EOM intact; No scleral icterus,                 Nares: Patent, Oropharynx: Clear, Fair Dentition                 Neck: FROM, no cervical lymphadenopathy, thyromegaly, carotid bruit or JVD;  CHEST: Normal respiration, clear to auscultation bilaterally  HEART: Regular rate and rhythm; no murmurs rubs or gallops  BACK: No kyphosis or scoliosis; no CVA tenderness  ABDOMEN: Positive Bowel Sounds,  soft, non-tender; no masses, no organomegaly EXTREMITIES: No cyanosis, clubbing, or edema. Right leg is tender to touch and movement SKIN:  no rash or ulceration  CNS: Alert and Oriented x 4, Nonfocal exam, CN 2-12 intact  Labs on Admission:  Basic Metabolic Panel:  Recent Labs Lab 03/15/16 0936  NA 141  K 4.0  CL 109  CO2 24  GLUCOSE 115*  BUN 12  CREATININE 1.00  CALCIUM 9.3   Liver Function Tests:  Recent Labs Lab 03/15/16 0936  AST 24  ALT 23  ALKPHOS 47  BILITOT 1.2  PROT 7.8  ALBUMIN 4.8   No results for input(s): LIPASE, AMYLASE in the last 168 hours. No results for input(s): AMMONIA in the last 168 hours. CBC:  Recent Labs Lab 03/15/16 0936  WBC 7.5  NEUTROABS 6.3  HGB 11.7*  HCT 32.5*  MCV 78.7  PLT 103*   Cardiac Enzymes: No results for input(s): CKTOTAL, CKMB, CKMBINDEX, TROPONINI in the last 168 hours.  BNP (last 3 results) No results for input(s): BNP in the last 8760 hours.  ProBNP (last 3 results) No results for input(s): PROBNP in the last 8760 hours.  CBG: No results for input(s): GLUCAP in the last 168 hours.   Assessment/Plan Active Problems:   Sickle cell anemia with crisis (HCC)   Admits to the Day Hospital  IVF D5 .45% Saline @ 175 mls/hour  Weight based Dilaudid PCA started within 30 minutes of admission  IV Toradol 30 mg Q 6 H  Monitor vitals very closely, Re-evaluate pain scale every hour  Oxygen by [place] 2L  Patient will be re-evaluated for pain in the context of function and relationship to baseline as care progresses.  If no significant relieve from pain (remains above 5/10) will transfer patient to inpatient services for further evaluation and management  Code Status: Full  Family Communication: None  DVT Prophylaxis: Ambulate as tolerated   Time spent: 35 Minutes  Leonard Dumond, MD, MHA, FACP, FAAP, CPE  If 7PM-7AM, please contact night-coverage [email or web address] 03/15/2016, 11:54 AM

## 2016-03-15 NOTE — ED Notes (Signed)
Pt with sickle cell pain in lower right leg.  Started this morning.  Pt of Dr. August Saucerean.  Only takes meds when in crisis.  Only meds this morning is ibuprofen.  No chest pain.

## 2016-03-15 NOTE — Discharge Summary (Signed)
Physician Discharge Summary  [name] WUJ:[id number] DOB: [birth year] DOA: 03/15/2016  PCP: Gwenyth BenderEric L Dean, MD  Admit date: 03/15/2016  Discharge date: 03/15/2016  Time spent: 30 minutes  Discharge Diagnoses:  Active Problems:   Sickle cell anemia with crisis [facility](HCC)   Discharge Condition: Stable  Diet recommendation: Regular  Filed Weights   03/15/16 1123  Weight: 158 lb (71.668 kg)    History of present illness:   [name] is a 45 y.o. male with history of sickle cell anemia, avascular necrosis of the left femoral head, GERD and possible osteoarthritis, presented earlier this morning to the emergency department with right leg pain that he believes is from his sickle cell disease. Patient came back from work yesterday and started feeling pain on his right leg which gradually got worse, and his home pain medication was not providing sustained relief which include ibuprofen and oxycodone. Patient infrequently uses the ED, his last visit was in 2015 for sickle cell pain crisis. This symptom is typical of his crisis that most of the time he can manage it at home but this was slightly worse than usual. Patient denies any fever, no chills, no chest pain, no shortness of breath. No joint swelling. No redness. Patient has had 2 doses of IV Dilaudid in the ED, his pain is still at 8.  Hospital Course:  [name] was admitted to the day hospital with sickle cell painful crisis. Patient was treated with weight based IV Dilaudid PCA, IV Toradol as well as IV fluids. [name] showed significant improvement symptomatically and pain went down from 8 to 4 out of 10 at the time of discharge. [name] will follow-up in the clinic as previously scheduled, continue with home medications as per prior to admission. I discussed with Dr. August Saucerean who agreed to see patient as scheduled on Tuesday next week, but instructed patient to come by the clinic this afternoon to pick up prescription for pain  medication including Roxicodone, ibuprofen and Flexeril.  Discharge Exam: Filed Vitals:   03/15/16 1123  BP: 133/70  Pulse: 72  Temp: 99.4 F (37.4 C)  Resp: 20    General appearance: alert, cooperative and no distress Eyes: conjunctivae/corneas clear. PERRL, EOM's intact. Fundi benign. Neck: no adenopathy, no carotid bruit, no JVD, supple, symmetrical, trachea midline and thyroid not enlarged, symmetric, no tenderness/mass/nodules Back: symmetric, no curvature. ROM normal. No CVA tenderness. Resp: clear to auscultation bilaterally Chest wall: no tenderness Cardio: regular rate and rhythm, S1, S2 normal, no murmur, click, rub or gallop GI: soft, non-tender; bowel sounds normal; no masses, no organomegaly Extremities: extremities normal, atraumatic, no cyanosis or edema Pulses: 2+ and symmetric Skin: Skin color, texture, turgor normal. No rashes or lesions Neurologic: Grossly normal  Discharge Instructions  We discussed the need for good hydration, monitoring of hydration status, avoidance of heat, cold, stress, and infection triggers. We discussed the need to be compliant with taking Hydrea. [name] was reminded of the need to seek medical attention of any symptoms of bleeding, anemia, or infection occurs.  Follow up with Dr August Saucerean as scheduled. Pick up your prescriptions today.  Current Discharge Medication List    CONTINUE these medications which have NOT CHANGED   Details  Cholecalciferol (VITAMIN D PO) Take 1 tablet by mouth daily.    ferrous sulfate 325 (65 FE) MG tablet Take 325 mg by mouth daily with breakfast.    folic acid (FOLVITE) 1 MG tablet Take 1 tablet (1 mg total) by mouth daily. Qty: 30  tablet, Refills: 0    ibuprofen (ADVIL,MOTRIN) 800 MG tablet Take 800 mg by mouth every 8 (eight) hours as needed for moderate pain.    Omega-3 Fatty Acids (FISH OIL PO) Take 1 capsule by mouth daily.       No Known Allergies   Significant Diagnostic Studies: No  results found.  Signed:  Jeanann Lewandowsky MD, MHA, FACP, FAAP, CPE   03/15/2016, 4:35 PM

## 2016-03-17 ENCOUNTER — Telehealth (HOSPITAL_COMMUNITY): Payer: Self-pay | Admitting: *Deleted

## 2016-03-17 ENCOUNTER — Non-Acute Institutional Stay (HOSPITAL_COMMUNITY)
Admission: AD | Admit: 2016-03-17 | Discharge: 2016-03-17 | Disposition: A | Discharge status: Home / Self Care | Payer: PRIVATE HEALTH INSURANCE | Source: Ambulatory Visit | Attending: Internal Medicine | Admitting: Internal Medicine

## 2016-03-17 ENCOUNTER — Encounter (HOSPITAL_COMMUNITY): Payer: Self-pay | Admitting: *Deleted

## 2016-03-17 ENCOUNTER — Encounter: Payer: Self-pay | Admitting: Family Medicine

## 2016-03-17 DIAGNOSIS — Z79899 Other long term (current) drug therapy: Secondary | ICD-10-CM | POA: Insufficient documentation

## 2016-03-17 DIAGNOSIS — Z87891 Personal history of nicotine dependence: Secondary | ICD-10-CM | POA: Diagnosis not present

## 2016-03-17 DIAGNOSIS — D57219 Sickle-cell/Hb-C disease with crisis, unspecified: Secondary | ICD-10-CM | POA: Diagnosis present

## 2016-03-17 DIAGNOSIS — D57819 Other sickle-cell disorders with crisis, unspecified: Secondary | ICD-10-CM

## 2016-03-17 MED ORDER — ONDANSETRON HCL 4 MG/2ML IJ SOLN: 4.0000 mg | Freq: Four times a day (QID) | INTRAMUSCULAR | Status: DC | PRN

## 2016-03-17 MED ORDER — HYDROMORPHONE 1 MG/ML IV SOLN
INTRAVENOUS | Status: DC
  Administered 2016-03-17: 1.2 mg via INTRAVENOUS
  Administered 2016-03-17: 15:00:00 via INTRAVENOUS

## 2016-03-17 MED ORDER — HYDROMORPHONE 1 MG/ML IV SOLN
INTRAVENOUS | Status: DC
  Filled 2016-03-17: qty 25

## 2016-03-17 MED ORDER — HYDROMORPHONE HCL 2 MG/ML IJ SOLN
2.0000 mg | Freq: Once | INTRAMUSCULAR | Status: AC
  Administered 2016-03-17: 2 mg via INTRAVENOUS
  Filled 2016-03-17: qty 1

## 2016-03-17 MED ORDER — KETOROLAC TROMETHAMINE 30 MG/ML IJ SOLN
30.0000 mg | Freq: Once | INTRAMUSCULAR | Status: AC
  Administered 2016-03-17: 30 mg via INTRAVENOUS
  Filled 2016-03-17: qty 1

## 2016-03-17 MED ORDER — NALOXONE HCL 0.4 MG/ML IJ SOLN: 0.4000 mg | INTRAMUSCULAR | Status: DC | PRN

## 2016-03-17 MED ORDER — DIPHENHYDRAMINE HCL 50 MG/ML IJ SOLN: 12.5000 mg | Freq: Four times a day (QID) | INTRAMUSCULAR | Status: DC | PRN

## 2016-03-17 MED ORDER — HYDROMORPHONE HCL 2 MG/ML IJ SOLN
1.0000 mg | Freq: Once | INTRAMUSCULAR | Status: AC
Start: 2016-03-17 — End: 2016-03-17
  Administered 2016-03-17: 1 mg via INTRAVENOUS
  Filled 2016-03-17: qty 1

## 2016-03-17 MED ORDER — HYDROMORPHONE HCL 2 MG/ML IJ SOLN
0.5000 mg | Freq: Once | INTRAMUSCULAR | Status: AC
Start: 2016-03-17 — End: 2016-03-17
  Administered 2016-03-17: 0.5 mg via INTRAVENOUS
  Filled 2016-03-17: qty 1

## 2016-03-17 MED ORDER — SODIUM CHLORIDE 0.9% FLUSH: 9.0000 mL | INTRAVENOUS | Status: DC | PRN

## 2016-03-17 MED ORDER — DEXTROSE-NACL 5-0.45 % IV SOLN
INTRAVENOUS | Status: DC
  Administered 2016-03-17: 13:00:00 via INTRAVENOUS

## 2016-03-17 MED ORDER — DIPHENHYDRAMINE HCL 12.5 MG/5ML PO ELIX: 12.5000 mg | ORAL_SOLUTION | Freq: Four times a day (QID) | ORAL | Status: DC | PRN

## 2016-03-17 NOTE — Telephone Encounter (Signed)
Patient called complaining of 10/10 pain in right leg. Denies fever, pain and shortness of breath. No N/V or abdominal pain. Took Oxycodone 7.5 mg and Ibuprofen 800 mg at 7 am without relief. Wants to come to the day hospital for treatment.

## 2016-03-17 NOTE — Progress Notes (Signed)
Discharge instructions given to patient.  Pain improved to 3/10 on pain scale.  Significant other at bedside.  Patient verbalizes understanding.

## 2016-03-17 NOTE — Discharge Instructions (Signed)
Sickle Cell Anemia, Adult Sickle cell anemia is a condition in which red blood cells have an abnormal "sickle" shape. This abnormal shape shortens the cells' life span, which results in a lower than normal concentration of red blood cells in the blood. The sickle shape also causes the cells to clump together and block free blood flow through the blood vessels. As a result, the tissues and organs of the body do not receive enough oxygen. Sickle cell anemia causes organ damage and pain and increases the risk of infection. CAUSES  Sickle cell anemia is a genetic disorder. Those who receive two copies of the gene have the condition, and those who receive one copy have the trait. RISK FACTORS The sickle cell gene is most common in people whose families originated in [place]. Other areas of the globe where sickle cell trait occurs include the Mediterranean, [place] and [place], the [place], and the [place].  SIGNS AND SYMPTOMS  Pain, especially in the extremities, back, chest, or abdomen (common). The pain may start suddenly or may develop following an illness, especially if there is dehydration. Pain can also occur due to overexertion or exposure to extreme temperature changes.  Frequent severe bacterial infections, especially certain types of pneumonia and meningitis.  Pain and swelling in the hands and feet.  Decreased activity.   Loss of appetite.   Change in behavior.  Headaches.  Seizures.  Shortness of breath or difficulty breathing.  Vision changes.  Skin ulcers. Those with the trait may not have symptoms or they may have mild symptoms.  DIAGNOSIS  Sickle cell anemia is diagnosed with blood tests that demonstrate the genetic trait. It is often diagnosed during the newborn period, due to mandatory testing nationwide. A variety of blood tests, X-rays, CT scans, MRI scans, ultrasounds, and lung function tests may also be done to monitor the condition. TREATMENT  Sickle  cell anemia may be treated with:  Medicines. You may be given pain medicines, antibiotic medicines (to treat and prevent infections) or medicines to increase the production of certain types of hemoglobin.  Fluids.  Oxygen.  Blood transfusions. HOME CARE INSTRUCTIONS   Drink enough fluid to keep your urine clear or pale yellow. Increase your fluid intake in hot weather and during exercise.  Do not smoke. Smoking lowers oxygen levels in the blood.   Only take over-the-counter or prescription medicines for pain, fever, or discomfort as directed by your health care provider.  Take antibiotics as directed by your health care provider. Make sure you finish them it even if you start to feel better.   Take supplements as directed by your health care provider.   Consider wearing a medical alert bracelet. This tells anyone caring for you in an emergency of your condition.   When traveling, keep your medical information, health care provider's names, and the medicines you take with you at all times.   If you develop a fever, do not take medicines to reduce the fever right away. This could cover up a problem that is developing. Notify your health care provider.  Keep all follow-up appointments with your health care provider. Sickle cell anemia requires regular medical care. SEEK MEDICAL CARE IF: You have a fever. SEEK IMMEDIATE MEDICAL CARE IF:   You feel dizzy or faint.   You have new abdominal pain, especially on the left side near the stomach area.   You develop a persistent, often uncomfortable and painful penile erection (priapism). If this is not treated immediately it   will lead to impotence.   You have numbness your arms or legs or you have a hard time moving them.   You have a hard time with speech.   You have a fever or persistent symptoms for more than 2-3 days.   You have a fever and your symptoms suddenly get worse.   You have signs or symptoms of infection.  These include:   Chills.   Abnormal tiredness (lethargy).   Irritability.   Poor eating.   Vomiting.   You develop pain that is not helped with medicine.   You develop shortness of breath.  You have pain in your chest.   You are coughing up pus-like or bloody sputum.   You develop a stiff neck.  Your feet or hands swell or have pain.  Your abdomen appears bloated.  You develop joint pain. MAKE SURE YOU:  Understand these instructions.   This information is not intended to replace advice given to you by your health care provider. Make sure you discuss any questions you have with your health care provider.   Document Released: 02/22/2006 Document Revised: 12/05/2014 Document Reviewed: 06/26/2013 Elsevier Interactive Patient Education 2016 [employer]  

## 2016-03-17 NOTE — Discharge Summary (Signed)
[facility] Discharge Summary   Patient ID: [name] MRN: [id number] DOB/AGE: 05-13-1971 45 y.o.  Admit date: 03/17/2016 Discharge date: 03/17/2016  Primary Care Physician:  Gwenyth Bender, MD  Admission Diagnoses:  Active Problems:   Hemoglobin Short Pump with crisis [facility])  Discharge Medications:    Medication List    ASK your doctor about these medications        ferrous sulfate 325 (65 FE) MG tablet  Take 325 mg by mouth daily with breakfast.     FISH OIL PO  Take 1 capsule by mouth daily.     folic acid 1 MG tablet  Commonly known as:  FOLVITE  Take 1 tablet (1 mg total) by mouth daily.     ibuprofen 800 MG tablet  Commonly known as:  ADVIL,MOTRIN  Take 800 mg by mouth every 8 (eight) hours as needed for moderate pain.     oxyCODONE-acetaminophen 7.5-325 MG tablet  Commonly known as:  PERCOCET  Take 1 tablet by mouth every 6 (six) hours as needed for severe pain.     VITAMIN D PO  Take 1 tablet by mouth daily.         Consults:  None  Significant Diagnostic Studies:  No results found.   [facility] Course:  Mr. Witherspoon was admitted to the day infusion center for extended observation.   Mr. Victorian was given IV dilaudid per weight based rapid re-dosing. Pain intensity remained greater than 6, patient was started on a custom dose PCA. He used a total of 1.2 mg with 5 demands and 4 deliveries. Pain intensity decreased from 10/10 to 3//10. He says that he can manage at home on current medication regimen.  Patient was reminded to increase water intake to 64 ounces.  Mr. Pavek is alert, oriented and ambulatory. He will discharge home in stable condition.  He is a patient of Dr. Willey Blade, he is scheduled to follow up with Dr. August Saucer on 03/21/2016  The patient was given clear instructions to go to ER or return to medical center if symptoms do not improve, worsen or new problems develop. The patient verbalized understanding.     Physical Exam at Discharge:  BP 100/85 mmHg  Pulse 96  Temp(Src) 98.5 F (36.9 C) (Oral)  Resp 15  Ht  (1.676 m)  Wt 158 lb (71.668 kg)  BMI 25.51 kg/m2  SpO2 99%   General Appearance:    Alert, cooperative, no distress, appears stated age  Head:    Normocephalic, without obvious abnormality, atraumatic  Neck:   Supple, symmetrical, trachea midline, no adenopathy;       thyroid:  No enlargement/tenderness/nodules; no carotid   bruit or JVD  Back:     Symmetric, no curvature, ROM normal, no CVA tenderness  Lungs:     Clear to auscultation bilaterally, respirations unlabored  Chest wall:    No tenderness or deformity  Heart:    Regular rate and rhythm, S1 and S2 normal, no murmur, rub   or gallop  Abdomen:     Soft, non-tender, bowel sounds active all four quadrants,    no masses, no organomegaly  Extremities:   Extremities normal, atraumatic, no cyanosis or edema  Pulses:   2+ and symmetric all extremities  Skin:   Skin color, texture, turgor normal, no rashes or lesions  Lymph nodes:   Cervical, supraclavicular, and axillary nodes normal  Neurologic:   CNII-XII intact. Normal strength, sensation and reflexes  throughout    Disposition at Discharge: 01-Home or Self Care  Discharge Orders:   Condition at Discharge:   Stable  Time spent on Discharge:  15 minutes  Signed: Jawana Reagor M 03/17/2016, 5:08 PM

## 2016-03-17 NOTE — Telephone Encounter (Signed)
L. Hollis RN advised for patient to come to [facility] today. Called patient and advised patient and he said he will come now, before 1 p.

## 2016-03-17 NOTE — H&P (Signed)
////78 [facility] History and Physical   Date: 03/17/2016  Patient name: [name] Medical record number: [id number] Date of birth: 12-Jul-1971 Age: 45 y.o. Gender: male PCP: Gwenyth Bender, MD  Attending physician: Quentin Angst, MD  Chief Complaint: Right leg pain  History of Present Illness: [name], 45 year old male with a history of sickle cell anemia presents accompanied by wife complaining of right leg pain that is consistent with a sickle cell crisis. Patient was evaluated in clinic on 4818/2017 and was treated with IV antibiotics, pain decreased at that time. He states that on yesterday evening pain intensity increased. Pain is described as constant and throbbing. He last had Ibuprofen and Oxycodone early this am with unsatisfactory relief. Patient denies fever, chills, shortness of breath, joint erythema, or swelling.   Meds: Prescriptions prior to admission  Medication Sig Dispense Refill Last Dose  . Cholecalciferol (VITAMIN D PO) Take 1 tablet by mouth daily.   03/17/2016 at Unknown time  . ferrous sulfate 325 (65 FE) MG tablet Take 325 mg by mouth daily with breakfast.   03/17/2016 at Unknown time  . folic acid (FOLVITE) 1 MG tablet Take 1 tablet (1 mg total) by mouth daily. 30 tablet 0 03/17/2016 at Unknown time  . ibuprofen (ADVIL,MOTRIN) 800 MG tablet Take 800 mg by mouth every 8 (eight) hours as needed for moderate pain.   03/17/2016 at Unknown time  . Omega-3 Fatty Acids (FISH OIL PO) Take 1 capsule by mouth daily.   03/17/2016 at Unknown time  . oxyCODONE-acetaminophen (PERCOCET) 7.5-325 MG tablet Take 1 tablet by mouth every 6 (six) hours as needed for severe pain.   03/17/2016 at Unknown time    Allergies: Review of patient's allergies indicates no known allergies. Past Medical History  Diagnosis Date  . Gastritis and duodenitis   . Pain in joint, site unspecified   . Physical exam, annual 02/18/2010  . GERD (gastroesophageal reflux  disease)   . Avascular necrosis of left femoral head (HCC)   . Osteoarthrosis, unspecified whether generalized or localized, unspecified site   . Mental disorder   . Erectile dysfunction   . Low testosterone   . Tobacco use   . Mixed restrictive and obstructive lung disease (HCC)   . Sickle cell anemia [facility])    Past Surgical History  Procedure Laterality Date  . Electrocardiogram  01/31/2011  . Upper gastrointestinal endoscopy  11/18/2010  . Esophagogastroduodenoscopy  01/03/2013    Procedure: ESOPHAGOGASTRODUODENOSCOPY (EGD);  Surgeon: Meryl Dare, MD,FACG;  Location: [facility];  Service: Endoscopy;  Laterality: N/A;   History reviewed. No pertinent family history. Social History   Social History  . Marital Status: Married    Spouse Name: N/A  . Number of Children: N/A  . Years of Education: N/A   Occupational History  . Not on file.   Social History Main Topics  . Smoking status: Former Games developer  . Smokeless tobacco: Never Used  . Alcohol Use: 0.0 oz/week    0 Standard drinks or equivalent per week     Comment: OCCASIONAL  . Drug Use: No  . Sexual Activity: No   Other Topics Concern  . Not on file   Social History Narrative    Review of Systems: Constitutional: negative for fatigue, fevers and night sweats Eyes: negative Ears, nose, mouth, throat, and face: negative Respiratory: negative for dyspnea on exertion Cardiovascular: negative for fatigue, lower extremity edema and palpitations Gastrointestinal: negative for constipation, diarrhea and  nausea Genitourinary:negative Integument/breast: negative Hematologic/lymphatic: negative Musculoskeletal:positive for myalgias Neurological: negative Behavioral/Psych: negative Endocrine: negative Allergic/Immunologic: negative  Physical Exam: Blood pressure 124/67, pulse 113, temperature 99.1 F (37.3 C), temperature source Oral, resp. rate 22, height 5\' 6"  (1.676 m), weight 158 lb (71.668 kg), SpO2 100  %. BP 124/67 mmHg  Pulse 113  Temp(Src) 99.1 F (37.3 C) (Oral)  Resp 22  Ht 5\' 6"  (1.676 m)  Wt 158 lb (71.668 kg)  BMI 25.51 kg/m2  SpO2 100%  General Appearance:    Alert, cooperative, mild distress, appears stated age  Head:    Normocephalic, without obvious abnormality, atraumatic  Eyes:    PERRL, conjunctiva/corneas clear, EOM's intact, fundi    benign, both eyes       Ears:    Normal TM's and external ear canals, both ears  Nose:   Nares normal, septum midline, mucosa normal, no drainage    or sinus tenderness  Back:     Symmetric, no curvature, ROM normal, no CVA tenderness  Lungs:     Clear to auscultation bilaterally, respirations unlabored  Chest wall:    No tenderness or deformity  Heart:    Regular rate and rhythm, S1 and S2 normal, no murmur, rub   or gallop  Abdomen:     Soft, non-tender, bowel sounds active all four quadrants,    no masses, no organomegaly  Extremities:   Extremities normal, atraumatic, right leg pain  Pulses:   2+ and symmetric all extremities  Skin:   Skin color, texture, turgor normal, no rashes or lesions  Lymph nodes:   Cervical, supraclavicular, and axillary nodes normal  Neurologic:   CNII-XII intact. Normal strength, sensation and reflexes      throughout    Lab results: No results found for this or any previous visit (from the past 24 hour(s)).  Imaging results:  No results found.   Assessment & Plan:  Patient will be admitted to the day infusion center for extended observation  Start IV D5.45 for cellular rehydration at 150/hr  Start Toradol 30 mg IV every 6 hours for inflammation.  Start Dilaudid PCA  per weight based protocol.   Patient will be re-evaluated for pain intensity in the context of function and relationship to baseline as care progresses.  If no significant pain relief, will transfer patient to inpatient services for a higher level of care.   Reviewed previous labs  Ariann Khaimov M 03/17/2016, 12:40 PM

## 2016-03-17 NOTE — Telephone Encounter (Signed)
Returned call to patient concerning pain after speaking with L. Hollis NP about patient's symptoms. She recommends patient going to the ED for further workup since he was here this week and treated for the same pain and the pain had decreased to a manageable level. Pt. said "ok".  In 1 minute patient calls back with male on another line stating. "I know this is Sickle cell pain and I don't need any other tests, I just need the same medicine I got there. I don't want to go to the ED." Explained again the recommendation from L. Hollis NP. "OK" response from the patient.

## 2019-01-03 ENCOUNTER — Other Ambulatory Visit: Payer: Self-pay | Admitting: Internal Medicine

## 2019-01-03 DIAGNOSIS — R109 Unspecified abdominal pain: Secondary | ICD-10-CM

## 2019-01-10 ENCOUNTER — Ambulatory Visit
Admission: RE | Admit: 2019-01-10 | Discharge: 2019-01-10 | Disposition: A | Discharge status: Home / Self Care | Payer: PRIVATE HEALTH INSURANCE | Source: Ambulatory Visit | Attending: Internal Medicine | Admitting: Internal Medicine

## 2019-01-10 DIAGNOSIS — R109 Unspecified abdominal pain: Secondary | ICD-10-CM

## 2019-05-07 ENCOUNTER — Ambulatory Visit
Admission: RE | Admit: 2019-05-07 | Discharge: 2019-05-07 | Disposition: A | Discharge status: Home / Self Care | Payer: BLUE CROSS/BLUE SHIELD | Source: Ambulatory Visit | Attending: Internal Medicine | Admitting: Internal Medicine

## 2019-05-07 ENCOUNTER — Other Ambulatory Visit: Payer: Self-pay | Admitting: Internal Medicine

## 2019-05-07 DIAGNOSIS — M25551 Pain in right hip: Secondary | ICD-10-CM

## 2020-01-20 ENCOUNTER — Other Ambulatory Visit: Payer: Self-pay | Admitting: Orthopedic Surgery

## 2020-01-20 DIAGNOSIS — M25512 Pain in left shoulder: Secondary | ICD-10-CM

## 2020-01-24 ENCOUNTER — Ambulatory Visit
Admission: RE | Admit: 2020-01-24 | Discharge: 2020-01-24 | Disposition: A | Discharge status: Home / Self Care | Payer: BC Managed Care – PPO | Source: Ambulatory Visit | Attending: Orthopedic Surgery | Admitting: Orthopedic Surgery

## 2020-01-24 DIAGNOSIS — M25512 Pain in left shoulder: Secondary | ICD-10-CM

## 2020-02-27 NOTE — Patient Instructions (Addendum)
DUE TO COVID-19 ONLY ONE VISITOR IS ALLOWED TO COME WITH YOU AND STAY IN THE WAITING ROOM ONLY DURING PRE OP AND PROCEDURE DAY OF SURGERY. THE 1 VISITOR MAY VISIT WITH YOU AFTER SURGERY IN YOUR PRIVATE ROOM DURING VISITING HOURS ONLY!  YOU NEED TO HAVE A COVID 19 TEST ON:03/14/20  @ 11:20 am , THIS TEST MUST BE DONE BEFORE SURGERY, COME  [place], [place] , [address].  ([place]) ONCE YOUR COVID TEST IS COMPLETED, PLEASE BEGIN THE QUARANTINE INSTRUCTIONS AS OUTLINED IN YOUR HANDOUT.                [name]    Your procedure is scheduled on: 03/18/20   Report to [facility] Main  Entrance   Report to admitting at: 8:30 am AM     Call this number if you have problems the morning of surgery [phone]    Remember: NO SOLID FOOD AFTER MIDNIGHT THE NIGHT PRIOR TO SURGERY. NOTHING BY MOUTH EXCEPT CLEAR LIQUIDS UNTIL: 8:00 am . PLEASE FINISH ENSURE DRINK PER SURGEON ORDER  WHICH NEEDS TO BE COMPLETED AT: 8:00 am .   CLEAR LIQUID DIET   Foods Allowed                                                                     Foods Excluded  Coffee and tea, regular and decaf                             liquids that you cannot  Plain Jell-O any favor except red or purple                                           see through such as: Fruit ices (not with fruit pulp)                                     milk, soups, orange juice  Iced Popsicles                                    All solid food Carbonated beverages, regular and diet                                    Cranberry, grape and apple juices Sports drinks like Gatorade Lightly seasoned clear broth or consume(fat free) Sugar, honey syrup  Sample Menu Breakfast                                Lunch                                     Supper Cranberry juice  Beef broth                            Chicken broth Jell-O                                     Grape juice                            Apple juice Coffee or tea                        Jell-O                                      Popsicle                                                Coffee or tea                        Coffee or tea  _____________________________________________________________________   BRUSH YOUR TEETH MORNING OF SURGERY AND RINSE YOUR MOUTH OUT, NO CHEWING GUM CANDY OR MINTS.             You may not have any metal on your body including hair pins and              piercings  Do not wear jewelry, lotions, powders or perfumes, deodorant             Men may shave face and neck.   Do not bring valuables to the hospital. [facility] IS NOT             RESPONSIBLE   FOR VALUABLES.  Contacts, dentures or bridgework may not be worn into surgery.  Leave suitcase in the car. After surgery it may be brought to your room.     Patients discharged the day of surgery will not be allowed to drive home. IF YOU ARE HAVING SURGERY AND GOING HOME THE SAME DAY, YOU MUST HAVE AN ADULT TO DRIVE YOU HOME AND BE WITH YOU FOR 24 HOURS. YOU MAY GO HOME BY TAXI OR UBER OR ORTHERWISE, BUT AN ADULT MUST ACCOMPANY YOU HOME AND STAY WITH YOU FOR 24 HOURS.  Name and phone number of your driver:  Special Instructions: N/A              Please read over the following fact sheets you were given: _____________________________________________________________________             [facility] - Preparing for Surgery Before surgery, you can play an important role.  Because skin is not sterile, your skin needs to be as free of germs as possible.  You can reduce the number of germs on your skin by washing with CHG (chlorahexidine gluconate) soap before surgery.  CHG is an antiseptic cleaner which kills germs and bonds with the skin to continue killing germs even after washing. Please DO NOT use if you have an allergy to CHG or antibacterial soaps.  If your skin becomes reddened/irritated stop using the CHG and inform your nurse  when you  arrive at Short Stay. Do not shave (including legs and underarms) for at least 48 hours prior to the first CHG shower.  You may shave your face/neck. Please follow these instructions carefully:  1.  Shower with CHG Soap the night before surgery and the  morning of Surgery.  2.  If you choose to wash your hair, wash your hair first as usual with your  normal  shampoo.  3.  After you shampoo, rinse your hair and body thoroughly to remove the  shampoo.                           4.  Use CHG as you would any other liquid soap.  You can apply chg directly  to the skin and wash                       Gently with a scrungie or clean washcloth.  5.  Apply the CHG Soap to your body ONLY FROM THE NECK DOWN.   Do not use on face/ open                           Wound or open sores. Avoid contact with eyes, ears mouth and genitals (private parts).                       Wash face,  Genitals (private parts) with your normal soap.             6.  Wash thoroughly, paying special attention to the area where your surgery  will be performed.  7.  Thoroughly rinse your body with warm water from the neck down.  8.  DO NOT shower/wash with your normal soap after using and rinsing off  the CHG Soap.                9.  Pat yourself dry with a clean towel.            10.  Wear clean pajamas.            11.  Place clean sheets on your bed the night of your first shower and do not  sleep with pets. Day of Surgery : Do not apply any lotions/deodorants the morning of surgery.  Please wear clean clothes to the hospital/surgery center.  FAILURE TO FOLLOW THESE INSTRUCTIONS MAY RESULT IN THE CANCELLATION OF YOUR SURGERY PATIENT SIGNATURE_________________________________  NURSE SIGNATURE__________________________________  ________________________________________________________________________ [facility] Preparing for Total Shoulder Arthroplasty    Before surgery, you can play an important role. Because skin is not  sterile, your skin needs to be as free of germs as possible. You can reduce the number of germs on your skin by using the following products. . Benzoyl Peroxide Gel o Reduces the number of germs present on the skin o Applied twice a day to shoulder area starting two days before surgery    ==================================================================  Please follow these instructions carefully:  BENZOYL PEROXIDE 5% GEL  Please do not use if you have an allergy to benzoyl peroxide.   If your skin becomes reddened/irritated stop using the benzoyl peroxide.  Starting two days before surgery, apply as follows: 1. Apply benzoyl peroxide in the morning and at night. Apply after taking a shower. If you are not taking a shower clean entire shoulder front, back, and side along with the  armpit with a clean wet washcloth.  2. Place a quarter-sized dollop on your shoulder and rub in thoroughly, making sure to cover the front, back, and side of your shoulder, along with the armpit.   2 days before ____ AM   ____ PM              1 day before ____ AM   ____ PM                         3. Do this twice a day for two days.  (Last application is the night before surgery, AFTER using the CHG soap as described below).  4. Do NOT apply benzoyl peroxide gel on the day of surgery.

## 2020-02-27 NOTE — Progress Notes (Signed)
Can you please place some orders for the upcoming surgery.Thank you.

## 2020-03-10 ENCOUNTER — Encounter (HOSPITAL_COMMUNITY): Payer: Self-pay

## 2020-03-10 ENCOUNTER — Encounter (HOSPITAL_COMMUNITY)
Admission: RE | Admit: 2020-03-10 | Discharge: 2020-03-10 | Disposition: A | Discharge status: Home / Self Care | Payer: BC Managed Care – PPO | Source: Ambulatory Visit | Attending: Orthopaedic Surgery | Admitting: Orthopaedic Surgery

## 2020-03-10 ENCOUNTER — Other Ambulatory Visit: Payer: Self-pay

## 2020-03-10 DIAGNOSIS — Z01812 Encounter for preprocedural laboratory examination: Secondary | ICD-10-CM | POA: Insufficient documentation

## 2020-03-10 LAB — CBC
HCT: 29 % — ABNORMAL LOW (ref 39.0–52.0)
Hemoglobin: 8.6 g/dL — ABNORMAL LOW (ref 13.0–17.0)
MCH: 17.3 pg — ABNORMAL LOW (ref 26.0–34.0)
MCHC: 29.7 g/dL — ABNORMAL LOW (ref 30.0–36.0)
MCV: 58.4 fL — ABNORMAL LOW (ref 80.0–100.0)
Platelets: 211 10*3/uL (ref 150–400)
RBC: 4.97 MIL/uL (ref 4.22–5.81)
RDW: 20.1 % — ABNORMAL HIGH (ref 11.5–15.5)
WBC: 5.5 10*3/uL (ref 4.0–10.5)
nRBC: 0 % (ref 0.0–0.2)

## 2020-03-10 LAB — SURGICAL PCR SCREEN
MRSA, PCR: NEGATIVE
Staphylococcus aureus: NEGATIVE

## 2020-03-10 NOTE — Progress Notes (Signed)
Hemoglobin levels: 8.6

## 2020-03-10 NOTE — Progress Notes (Addendum)
PCP - Dr. Willey Blade. LOV: 03/03/20 Cardiologist -   Chest x-ray -  EKG -  Stress Test -  ECHO -  Cardiac Cath -   Sleep Study -  CPAP -   Fasting Blood Sugar -  Checks Blood Sugar _____ times a day  Blood Thinner Instructions: Aspirin Instructions: Last Dose:  Anesthesia review:   Patient denies shortness of breath, fever, cough and chest pain at PAT appointment   Patient verbalized understanding of instructions that were given to them at the PAT appointment. Patient was also instructed that they will need to review over the PAT instructions again at home before surgery.

## 2020-03-14 ENCOUNTER — Other Ambulatory Visit (HOSPITAL_COMMUNITY)
Admission: RE | Admit: 2020-03-14 | Discharge: 2020-03-14 | Disposition: A | Discharge status: Home / Self Care | Payer: BC Managed Care – PPO | Source: Ambulatory Visit | Attending: Orthopaedic Surgery | Admitting: Orthopaedic Surgery

## 2020-03-14 DIAGNOSIS — Z01812 Encounter for preprocedural laboratory examination: Secondary | ICD-10-CM | POA: Diagnosis not present

## 2020-03-14 DIAGNOSIS — Z20822 Contact with and (suspected) exposure to covid-19: Secondary | ICD-10-CM | POA: Insufficient documentation

## 2020-03-14 LAB — SARS CORONAVIRUS 2 (TAT 6-24 HRS): SARS Coronavirus 2: NEGATIVE

## 2020-03-17 NOTE — H&P (Signed)
PREOPERATIVE H&P  Chief Complaint: OA LEFT SHOULDER  HPI: [name] is a 49 y.o. male who is scheduled for TOTAL SHOULDER ARTHROPLASTY versus reverse total shoulder.   Patient has a past medical history significant for sickle cell anemia, GERD, gastritis, and tobacco use.   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  Pain is 8 to 9/10.  He has had chronic worsening conditions.  Left hand dominant at baseline.  He is not able to raise his hand over his shoulder. He is progressively getting worse function of his left shoulder.   His symptoms are rated as moderate to severe, and have been worsening.  This is significantly impairing activities of daily living.    Please see clinic note for further details on this patient's care.    He has elected for surgical management.   Past Medical History:  Diagnosis Date  . Avascular necrosis of left femoral head ([place])   . Erectile dysfunction   . Gastritis and duodenitis   . GERD (gastroesophageal reflux disease)   . Low testosterone   . Mental disorder   . Mixed restrictive and obstructive lung disease ([place])   . Osteoarthrosis, unspecified whether generalized or localized, unspecified site   . Pain in joint, site unspecified   . Physical exam, annual 02/18/2010  . Sickle cell anemia ([facility])   . Tobacco use    Past Surgical History:  Procedure Laterality Date  . ELECTROCARDIOGRAM  01/31/2011  . ESOPHAGOGASTRODUODENOSCOPY  01/03/2013   Procedure: ESOPHAGOGASTRODUODENOSCOPY (EGD);  Surgeon: Ladene Artist, MD,FACG;  Location: [facility];  Service: Endoscopy;  Laterality: N/A;  . UPPER GASTROINTESTINAL ENDOSCOPY  11/18/2010   Social History   Socioeconomic History  . Marital status: Married    Spouse name: Not on file  . Number of children: Not on file  . Years of education: Not on file  . Highest education level: Not on file  Occupational History  . Not on file    Tobacco Use  . Smoking status: Former Research scientist (life sciences)  . Smokeless tobacco: Never Used  Substance and Sexual Activity  . Alcohol use: Yes    Alcohol/week: 0.0 standard drinks    Comment: OCCASIONAL  . Drug use: No  . Sexual activity: Never  Other Topics Concern  . Not on file  Social History Narrative  . Not on file   Social Determinants of Health   Financial Resource Strain:   . Difficulty of Paying Living Expenses:   Food Insecurity:   . Worried About Charity fundraiser in the Last Year:   . Arboriculturist in the Last Year:   Transportation Needs:   . Film/video editor (Medical):   Marland Kitchen Lack of Transportation (Non-Medical):   Physical Activity:   . Days of Exercise per Week:   . Minutes of Exercise per Session:   Stress:   . Feeling of Stress :   Social Connections:   . Frequency of Communication with Friends and Family:   . Frequency of Social Gatherings with Friends and Family:   . Attends Religious Services:   . Active Member of Clubs or Organizations:   . Attends Archivist Meetings:   Marland Kitchen Marital Status:    No family history on file. No Known Allergies Prior to Admission medications   Medication Sig Start Date End Date Taking? Authorizing Provider  acetaminophen (TYLENOL) 500 MG tablet Take 500-1,000  mg by mouth every 6 (six) hours as needed (for pain.).   Yes [provider]  Cholecalciferol (VITAMIN D3) 250 MCG (10000 UT) TABS Take 10,000 Units by mouth daily.   Yes [provider]  folic acid (FOLVITE) 400 MCG tablet Take 400 mcg by mouth daily.   Yes [provider]  ibuprofen (ADVIL,MOTRIN) 800 MG tablet Take 800 mg by mouth every 8 (eight) hours as needed for moderate pain.   Yes [provider]  Omega-3 Fatty Acids (FISH OIL) 1000 MG CAPS Take 1,000 mg by mouth daily.   Yes [provider]  oxyCODONE-acetaminophen (PERCOCET) 10-325 MG tablet Take 1 tablet by mouth every 6 (six) hours as needed for pain.  01/20/20  Yes [provider]  Zinc 50 MG TABS Take 50 mg by mouth daily.   Yes [provider]  ferrous sulfate 325 (65 FE) MG tablet Take 325 mg by mouth daily with breakfast.    [provider]    ROS: All other systems have been reviewed and were otherwise negative with the exception of those mentioned in the HPI and as above.  Physical Exam: General: Alert, no acute distress Cardiovascular: No pedal edema Respiratory: No cyanosis, no use of accessory musculature GI: No organomegaly, abdomen is soft and non-tender Skin: No lesions in the area of chief complaint Neurologic: Sensation intact distally Psychiatric: Patient is competent for consent with normal mood and affect Lymphatic: No axillary or cervical lymphadenopathy  MUSCULOSKELETAL:  Left shoulder: Active forward elevation to 60.  External rotation -30.  Internal rotation to front pocket.  Cuff strength is relatively preserved, though painful during testing and inconsistent during testing.    Imaging: X-rays and CT scan of the left shoulder reviewed and demonstrate end stage collapse with avascular necrosis and atypical inferior anterior wear of the glenoid with medialization of the glenoid overall.  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    Plan: Plan for Procedure(s): TOTAL SHOULDER ARTHROPLASTY versus Reverse total shoulder arthroplasty  The risks benefits and alternatives were discussed with the patient including but not limited to the risks of nonoperative treatment, versus surgical intervention including infection, bleeding, nerve injury,  blood clots, cardiopulmonary complications, morbidity, mortality, among others, and they were willing to proceed.   We additionally specifically discussed risks of axillary nerve injury, infection, periprosthetic fracture, continued pain and longevity of implants prior to beginning procedure.   He understands the  longevity of implants may be a concern secondary to his age, infection, loss of fixation and incomplete pain relief and motion improvement are all discussed.    Patient will be admitted for observation after surgery due to his history of sickle cell anemia. We will obtain a hospitalist consult after surgery for evaluation and help with monitoring for sickle cell crisis. We will obtain post-op CBC after surgery and the morning following surgery. The patient is planning to be discharged home the morning after surgery with outpatient PT.   The patient acknowledged the explanation, agreed to proceed with the plan and consent was signed.   Operative Plan: Left TSA vs reverse Discharge Medications: Tylenol, Celebrex, Oxycodone, Zofran DVT Prophylaxis: TBD Physical Therapy: Outpatient PT Special Discharge needs: Sling, hospitalist consult, labs   Vernetta Honey, PA-C  03/17/2020 7:22 AM

## 2020-03-18 ENCOUNTER — Inpatient Hospital Stay (HOSPITAL_COMMUNITY)
Admission: AD | Admit: 2020-03-18 | Discharge: 2020-03-20 | DRG: 483 | Disposition: A | Discharge status: Home / Self Care | Payer: BC Managed Care – PPO | Attending: Orthopaedic Surgery | Admitting: Orthopaedic Surgery

## 2020-03-18 ENCOUNTER — Encounter (HOSPITAL_COMMUNITY)
Admission: AD | Disposition: A | Discharge status: Home / Self Care | Payer: Self-pay | Source: Home / Self Care | Attending: Orthopaedic Surgery

## 2020-03-18 ENCOUNTER — Ambulatory Visit (HOSPITAL_COMMUNITY): Payer: BC Managed Care – PPO

## 2020-03-18 ENCOUNTER — Encounter (HOSPITAL_COMMUNITY): Payer: Self-pay | Admitting: Orthopaedic Surgery

## 2020-03-18 ENCOUNTER — Other Ambulatory Visit: Payer: Self-pay

## 2020-03-18 ENCOUNTER — Ambulatory Visit (HOSPITAL_COMMUNITY): Payer: BC Managed Care – PPO | Admitting: Certified Registered"

## 2020-03-18 DIAGNOSIS — N529 Male erectile dysfunction, unspecified: Secondary | ICD-10-CM | POA: Diagnosis present

## 2020-03-18 DIAGNOSIS — M19012 Primary osteoarthritis, left shoulder: Principal | ICD-10-CM | POA: Diagnosis present

## 2020-03-18 DIAGNOSIS — Z79899 Other long term (current) drug therapy: Secondary | ICD-10-CM

## 2020-03-18 DIAGNOSIS — Z87891 Personal history of nicotine dependence: Secondary | ICD-10-CM

## 2020-03-18 DIAGNOSIS — D57219 Sickle-cell/Hb-C disease with crisis, unspecified: Secondary | ICD-10-CM | POA: Diagnosis present

## 2020-03-18 DIAGNOSIS — K219 Gastro-esophageal reflux disease without esophagitis: Secondary | ICD-10-CM | POA: Diagnosis present

## 2020-03-18 DIAGNOSIS — Z09 Encounter for follow-up examination after completed treatment for conditions other than malignant neoplasm: Secondary | ICD-10-CM

## 2020-03-18 DIAGNOSIS — M87029 Idiopathic aseptic necrosis of unspecified humerus: Secondary | ICD-10-CM | POA: Diagnosis present

## 2020-03-18 DIAGNOSIS — M659 Synovitis and tenosynovitis, unspecified: Secondary | ICD-10-CM | POA: Diagnosis present

## 2020-03-18 DIAGNOSIS — M879 Osteonecrosis, unspecified: Secondary | ICD-10-CM | POA: Diagnosis present

## 2020-03-18 DIAGNOSIS — Z885 Allergy status to narcotic agent status: Secondary | ICD-10-CM

## 2020-03-18 DIAGNOSIS — D5700 Hb-SS disease with crisis, unspecified: Secondary | ICD-10-CM | POA: Diagnosis present

## 2020-03-18 DIAGNOSIS — J449 Chronic obstructive pulmonary disease, unspecified: Secondary | ICD-10-CM | POA: Diagnosis present

## 2020-03-18 DIAGNOSIS — Z20822 Contact with and (suspected) exposure to covid-19: Secondary | ICD-10-CM | POA: Diagnosis present

## 2020-03-18 HISTORY — PX: TOTAL SHOULDER ARTHROPLASTY: SHX126

## 2020-03-18 LAB — CBC
HCT: 23.4 % — ABNORMAL LOW (ref 39.0–52.0)
Hemoglobin: 7.3 g/dL — ABNORMAL LOW (ref 13.0–17.0)
MCH: 18.5 pg — ABNORMAL LOW (ref 26.0–34.0)
MCHC: 31.2 g/dL (ref 30.0–36.0)
MCV: 59.2 fL — ABNORMAL LOW (ref 80.0–100.0)
Platelets: 203 10*3/uL (ref 150–400)
RBC: 3.95 MIL/uL — ABNORMAL LOW (ref 4.22–5.81)
RDW: 21.8 % — ABNORMAL HIGH (ref 11.5–15.5)
WBC: 8.6 10*3/uL (ref 4.0–10.5)
nRBC: 0 % (ref 0.0–0.2)

## 2020-03-18 LAB — CREATININE, SERUM
Creatinine, Ser: 1.18 mg/dL (ref 0.61–1.24)
GFR calc Af Amer: >60 mL/min (ref >60)
GFR calc non Af Amer: >60 mL/min (ref >60)

## 2020-03-18 SURGERY — ARTHROPLASTY, SHOULDER, TOTAL
Anesthesia: Regional | Site: Shoulder | Laterality: Left

## 2020-03-18 MED ORDER — MENTHOL 3 MG MT LOZG: 1.0000 | LOZENGE | OROMUCOSAL | Status: DC | PRN

## 2020-03-18 MED ORDER — SODIUM CHLORIDE 0.9 % IR SOLN
Status: DC | PRN
  Administered 2020-03-18: 3000 mL

## 2020-03-18 MED ORDER — FENTANYL CITRATE (PF) 100 MCG/2ML IJ SOLN
INTRAMUSCULAR | Status: AC
  Filled 2020-03-18: qty 2

## 2020-03-18 MED ORDER — POLYETHYLENE GLYCOL 3350 17 G PO PACK
17.0000 g | PACK | Freq: Every day | ORAL | Status: DC | PRN
  Administered 2020-03-20: 10:00:00 17 g via ORAL
  Filled 2020-03-18: qty 1

## 2020-03-18 MED ORDER — BISACODYL 10 MG RE SUPP: 10.0000 mg | Freq: Every day | RECTAL | Status: DC | PRN

## 2020-03-18 MED ORDER — MAGNESIUM CITRATE PO SOLN: 1.0000 | Freq: Once | ORAL | Status: DC | PRN

## 2020-03-18 MED ORDER — PHENYLEPHRINE 40 MCG/ML (10ML) SYRINGE FOR IV PUSH (FOR BLOOD PRESSURE SUPPORT)
PREFILLED_SYRINGE | INTRAVENOUS | Status: DC | PRN
  Administered 2020-03-18: 120 ug via INTRAVENOUS

## 2020-03-18 MED ORDER — ENOXAPARIN SODIUM 40 MG/0.4ML ~~LOC~~ SOLN
40.0000 mg | SUBCUTANEOUS | Status: DC
  Administered 2020-03-18 – 2020-03-19 (×2): 40 mg via SUBCUTANEOUS
  Filled 2020-03-18 (×2): qty 0.4

## 2020-03-18 MED ORDER — ACETAMINOPHEN 500 MG PO TABS: 1000.0000 mg | ORAL_TABLET | Freq: Once | ORAL | Status: DC

## 2020-03-18 MED ORDER — PROPOFOL 10 MG/ML IV BOLUS
INTRAVENOUS | Status: AC
  Filled 2020-03-18: qty 20

## 2020-03-18 MED ORDER — LACTATED RINGERS IV SOLN: INTRAVENOUS | Status: DC

## 2020-03-18 MED ORDER — METHOCARBAMOL 1000 MG/10ML IJ SOLN
500.0000 mg | Freq: Four times a day (QID) | INTRAVENOUS | Status: DC | PRN
  Filled 2020-03-18: qty 5

## 2020-03-18 MED ORDER — METOCLOPRAMIDE HCL 5 MG PO TABS: 5.0000 mg | ORAL_TABLET | Freq: Three times a day (TID) | ORAL | Status: DC | PRN

## 2020-03-18 MED ORDER — ACETAMINOPHEN 10 MG/ML IV SOLN
INTRAVENOUS | Status: DC | PRN
  Administered 2020-03-18: 1000 mg via INTRAVENOUS

## 2020-03-18 MED ORDER — HYDROMORPHONE HCL 1 MG/ML IJ SOLN
0.5000 mg | INTRAMUSCULAR | Status: DC | PRN
  Administered 2020-03-18 – 2020-03-19 (×4): 1 mg via INTRAVENOUS
  Filled 2020-03-18 (×5): qty 1

## 2020-03-18 MED ORDER — VANCOMYCIN HCL 1000 MG IV SOLR
INTRAVENOUS | Status: AC
  Filled 2020-03-18: qty 1000

## 2020-03-18 MED ORDER — BUPIVACAINE LIPOSOME 1.3 % IJ SUSP
INTRAMUSCULAR | Status: DC | PRN
  Administered 2020-03-18: 10 mL via PERINEURAL

## 2020-03-18 MED ORDER — ZOLPIDEM TARTRATE 5 MG PO TABS: 5.0000 mg | ORAL_TABLET | Freq: Every evening | ORAL | Status: DC | PRN

## 2020-03-18 MED ORDER — ACETAMINOPHEN 500 MG PO TABS
1000.0000 mg | ORAL_TABLET | Freq: Three times a day (TID) | ORAL | Status: AC
  Administered 2020-03-18 – 2020-03-19 (×3): 1000 mg via ORAL
  Filled 2020-03-18 (×3): qty 2

## 2020-03-18 MED ORDER — DEXAMETHASONE SODIUM PHOSPHATE 10 MG/ML IJ SOLN
INTRAMUSCULAR | Status: DC | PRN
  Administered 2020-03-18: 8 mg via INTRAVENOUS

## 2020-03-18 MED ORDER — PHENYLEPHRINE HCL (PRESSORS) 10 MG/ML IV SOLN
INTRAVENOUS | Status: AC
  Filled 2020-03-18: qty 2

## 2020-03-18 MED ORDER — MIDAZOLAM HCL 2 MG/2ML IJ SOLN
INTRAMUSCULAR | Status: AC
  Filled 2020-03-18: qty 2

## 2020-03-18 MED ORDER — ONDANSETRON HCL 4 MG PO TABS: 4.0000 mg | ORAL_TABLET | Freq: Four times a day (QID) | ORAL | Status: DC | PRN

## 2020-03-18 MED ORDER — PROPOFOL 10 MG/ML IV BOLUS
INTRAVENOUS | Status: DC | PRN
  Administered 2020-03-18: 130 mg via INTRAVENOUS

## 2020-03-18 MED ORDER — DOCUSATE SODIUM 100 MG PO CAPS
100.0000 mg | ORAL_CAPSULE | Freq: Two times a day (BID) | ORAL | Status: DC
  Administered 2020-03-18 – 2020-03-20 (×4): 100 mg via ORAL
  Filled 2020-03-18 (×4): qty 1

## 2020-03-18 MED ORDER — VANCOMYCIN HCL 1 G IV SOLR
INTRAVENOUS | Status: DC | PRN
  Administered 2020-03-18: 1000 mg

## 2020-03-18 MED ORDER — ONDANSETRON HCL 4 MG/2ML IJ SOLN: 4.0000 mg | Freq: Four times a day (QID) | INTRAMUSCULAR | Status: DC | PRN

## 2020-03-18 MED ORDER — SUGAMMADEX SODIUM 200 MG/2ML IV SOLN
INTRAVENOUS | Status: DC | PRN
  Administered 2020-03-18: 200 mg via INTRAVENOUS

## 2020-03-18 MED ORDER — ONDANSETRON HCL 4 MG/2ML IJ SOLN
INTRAMUSCULAR | Status: AC
  Filled 2020-03-18: qty 2

## 2020-03-18 MED ORDER — BUPIVACAINE-EPINEPHRINE (PF) 0.5% -1:200000 IJ SOLN
INTRAMUSCULAR | Status: AC
  Filled 2020-03-18: qty 30

## 2020-03-18 MED ORDER — DEXAMETHASONE SODIUM PHOSPHATE 10 MG/ML IJ SOLN
INTRAMUSCULAR | Status: AC
  Filled 2020-03-18: qty 1

## 2020-03-18 MED ORDER — TRANEXAMIC ACID-NACL 1000-0.7 MG/100ML-% IV SOLN
1000.0000 mg | INTRAVENOUS | Status: AC
  Administered 2020-03-18: 09:00:00 1000 mg via INTRAVENOUS
  Filled 2020-03-18: qty 100

## 2020-03-18 MED ORDER — CELECOXIB 200 MG PO CAPS
200.0000 mg | ORAL_CAPSULE | Freq: Two times a day (BID) | ORAL | Status: DC
  Administered 2020-03-18 – 2020-03-19 (×3): 200 mg via ORAL
  Filled 2020-03-18 (×3): qty 1

## 2020-03-18 MED ORDER — CEFAZOLIN SODIUM-DEXTROSE 1-4 GM/50ML-% IV SOLN
1.0000 g | Freq: Four times a day (QID) | INTRAVENOUS | Status: AC
  Administered 2020-03-18 – 2020-03-19 (×3): 1 g via INTRAVENOUS
  Filled 2020-03-18 (×3): qty 50

## 2020-03-18 MED ORDER — OXYCODONE HCL 5 MG PO TABS: 5.0000 mg | ORAL_TABLET | ORAL | Status: DC | PRN

## 2020-03-18 MED ORDER — FENTANYL CITRATE (PF) 100 MCG/2ML IJ SOLN: 25.0000 ug | INTRAMUSCULAR | Status: DC | PRN

## 2020-03-18 MED ORDER — LIDOCAINE 2% (20 MG/ML) 5 ML SYRINGE
INTRAMUSCULAR | Status: AC
  Filled 2020-03-18: qty 5

## 2020-03-18 MED ORDER — PHENYLEPHRINE HCL-NACL 20-0.9 MG/250ML-% IV SOLN
INTRAVENOUS | Status: DC | PRN
  Administered 2020-03-18: 15 ug/min via INTRAVENOUS

## 2020-03-18 MED ORDER — ACETAMINOPHEN 10 MG/ML IV SOLN
INTRAVENOUS | Status: AC
  Filled 2020-03-18: qty 100

## 2020-03-18 MED ORDER — MIDAZOLAM HCL 5 MG/5ML IJ SOLN
INTRAMUSCULAR | Status: DC | PRN
  Administered 2020-03-18: 2 mg via INTRAVENOUS

## 2020-03-18 MED ORDER — TOBRAMYCIN SULFATE 1.2 G IJ SOLR
INTRAMUSCULAR | Status: DC | PRN
  Administered 2020-03-18: 1.2 g

## 2020-03-18 MED ORDER — METHOCARBAMOL 500 MG PO TABS
500.0000 mg | ORAL_TABLET | Freq: Four times a day (QID) | ORAL | Status: DC | PRN
  Administered 2020-03-18 – 2020-03-19 (×3): 500 mg via ORAL
  Filled 2020-03-18 (×4): qty 1

## 2020-03-18 MED ORDER — PHENOL 1.4 % MT LIQD: 1.0000 | OROMUCOSAL | Status: DC | PRN

## 2020-03-18 MED ORDER — CEFAZOLIN SODIUM-DEXTROSE 2-4 GM/100ML-% IV SOLN
2.0000 g | INTRAVENOUS | Status: AC
  Administered 2020-03-18: 2 g via INTRAVENOUS
  Filled 2020-03-18: qty 100

## 2020-03-18 MED ORDER — 0.9 % SODIUM CHLORIDE (POUR BTL) OPTIME
TOPICAL | Status: DC | PRN
  Administered 2020-03-18: 1000 mL

## 2020-03-18 MED ORDER — FENTANYL CITRATE (PF) 100 MCG/2ML IJ SOLN
INTRAMUSCULAR | Status: DC | PRN
  Administered 2020-03-18 (×4): 50 ug via INTRAVENOUS

## 2020-03-18 MED ORDER — METOCLOPRAMIDE HCL 5 MG/ML IJ SOLN: 5.0000 mg | Freq: Three times a day (TID) | INTRAMUSCULAR | Status: DC | PRN

## 2020-03-18 MED ORDER — ONDANSETRON HCL 4 MG/2ML IJ SOLN
INTRAMUSCULAR | Status: DC | PRN
  Administered 2020-03-18: 4 mg via INTRAVENOUS

## 2020-03-18 MED ORDER — BUPIVACAINE HCL 0.25 % IJ SOLN
INTRAMUSCULAR | Status: AC
  Filled 2020-03-18: qty 1

## 2020-03-18 MED ORDER — ROCURONIUM BROMIDE 10 MG/ML (PF) SYRINGE
PREFILLED_SYRINGE | INTRAVENOUS | Status: AC
  Filled 2020-03-18: qty 10

## 2020-03-18 MED ORDER — ROCURONIUM BROMIDE 10 MG/ML (PF) SYRINGE
PREFILLED_SYRINGE | INTRAVENOUS | Status: DC | PRN
  Administered 2020-03-18: 20 mg via INTRAVENOUS
  Administered 2020-03-18: 60 mg via INTRAVENOUS
  Administered 2020-03-18: 20 mg via INTRAVENOUS
  Administered 2020-03-18: 10 mg via INTRAVENOUS
  Administered 2020-03-18: 30 mg via INTRAVENOUS
  Administered 2020-03-18: 10 mg via INTRAVENOUS

## 2020-03-18 MED ORDER — OXYCODONE HCL 5 MG PO TABS
10.0000 mg | ORAL_TABLET | ORAL | Status: DC | PRN
  Administered 2020-03-18: 10 mg via ORAL
  Administered 2020-03-18 – 2020-03-19 (×4): 15 mg via ORAL
  Filled 2020-03-18 (×4): qty 3
  Filled 2020-03-18: qty 2

## 2020-03-18 MED ORDER — LIDOCAINE 2% (20 MG/ML) 5 ML SYRINGE
INTRAMUSCULAR | Status: DC | PRN
  Administered 2020-03-18: 60 mg via INTRAVENOUS

## 2020-03-18 MED ORDER — BUPIVACAINE HCL (PF) 0.5 % IJ SOLN
INTRAMUSCULAR | Status: DC | PRN
  Administered 2020-03-18: 15 mL via PERINEURAL

## 2020-03-18 MED ORDER — DIPHENHYDRAMINE HCL 12.5 MG/5ML PO ELIX
12.5000 mg | ORAL_SOLUTION | ORAL | Status: DC | PRN
  Administered 2020-03-19: 25 mg via ORAL
  Filled 2020-03-18: qty 10

## 2020-03-18 MED ORDER — TOBRAMYCIN SULFATE 1.2 G IJ SOLR
INTRAMUSCULAR | Status: AC
  Filled 2020-03-18: qty 1.2

## 2020-03-18 SURGICAL SUPPLY — 82 items
AID PSTN UNV HD RSTRNT DISP (MISCELLANEOUS) ×1
APL PRP STRL LF DISP 70% ISPRP (MISCELLANEOUS) ×2
BASEPLATE GLENOSPHERE 25 STD (Miscellaneous) ×1 IMPLANT
BIT DRILL 3.2 PERIPHERAL SCREW (BIT) ×1 IMPLANT
BLADE SAW SAG 29X58X.64 (BLADE) IMPLANT
BLADE SAW SAG 73X25 THK (BLADE)
BLADE SAW SGTL 73X25 THK (BLADE) IMPLANT
BSPLAT GLND STD 25 RVRS SHLDR (Miscellaneous) ×1 IMPLANT
BUR OVAL CARBIDE 4.0 (BURR) ×1 IMPLANT
CHLORAPREP W/TINT 26 (MISCELLANEOUS) ×4 IMPLANT
CLSR STERI-STRIP ANTIMIC 1/2X4 (GAUZE/BANDAGES/DRESSINGS) ×2 IMPLANT
COOLER ICEMAN CLASSIC (MISCELLANEOUS) IMPLANT
COVER BACK TABLE 60X90IN (DRAPES) ×2 IMPLANT
COVER SURGICAL LIGHT HANDLE (MISCELLANEOUS) ×2 IMPLANT
COVER WAND RF STERILE (DRAPES) ×2 IMPLANT
DRAPE C-ARM 42X120 X-RAY (DRAPES) IMPLANT
DRAPE INCISE IOBAN 66X45 STRL (DRAPES) ×4 IMPLANT
DRAPE ORTHO SPLIT 77X108 STRL (DRAPES) ×4
DRAPE SHEET LG 3/4 BI-LAMINATE (DRAPES) ×4 IMPLANT
DRAPE SURG ORHT 6 SPLT 77X108 (DRAPES) ×2 IMPLANT
DRSG AQUACEL AG ADV 3.5X 6 (GAUZE/BANDAGES/DRESSINGS) ×2 IMPLANT
DRSG AQUACEL AG ADV 3.5X10 (GAUZE/BANDAGES/DRESSINGS) ×1 IMPLANT
ELECT BLADE TIP CTD 4 INCH (ELECTRODE) ×2 IMPLANT
ELECT REM PT RETURN 15FT ADLT (MISCELLANEOUS) ×2 IMPLANT
GLENOIDSPHERE LATERALIZED 33 (Joint) ×2 IMPLANT
GLENOSPHERE REV SHOULDER 36 (Joint) ×1 IMPLANT
GLOVE BIO SURGEON STRL SZ7 (GLOVE) ×4 IMPLANT
GLOVE BIOGEL PI IND STRL 7.0 (GLOVE) ×1 IMPLANT
GLOVE BIOGEL PI IND STRL 8 (GLOVE) ×1 IMPLANT
GLOVE BIOGEL PI INDICATOR 7.0 (GLOVE) ×1
GLOVE BIOGEL PI INDICATOR 8 (GLOVE) ×1
GLOVE ECLIPSE 8.0 STRL XLNG CF (GLOVE) ×4 IMPLANT
GOWN STRL REUS W/ TWL LRG LVL3 (GOWN DISPOSABLE) ×2 IMPLANT
GOWN STRL REUS W/TWL LRG LVL3 (GOWN DISPOSABLE) ×8 IMPLANT
GUIDE PIN 3X75 SHOULDER (PIN) ×2
GUIDEWIRE GLENOID 2.5X220 (WIRE) ×1 IMPLANT
HANDPIECE INTERPULSE COAX TIP (DISPOSABLE) ×2
IMPL REVERSE SHOULDER 0X3.5 (Shoulder) IMPLANT
IMPLANT REVERSE SHOULDER 0X3.5 (Shoulder) ×2 IMPLANT
KIT BASIN (CUSTOM PROCEDURE TRAY) ×2 IMPLANT
KIT STABILIZATION SHOULDER (MISCELLANEOUS) ×2 IMPLANT
KIT TURNOVER KIT A (KITS) ×2 IMPLANT
MANIFOLD NEPTUNE II (INSTRUMENTS) ×2 IMPLANT
NDL HYPO 25X1 1.5 SAFETY (NEEDLE) IMPLANT
NDL MAYO CATGUT SZ4 TPR NDL (NEEDLE) ×1 IMPLANT
NEEDLE HYPO 25X1 1.5 SAFETY (NEEDLE) IMPLANT
NEEDLE MAYO CATGUT SZ4 (NEEDLE) ×2 IMPLANT
NS IRRIG 1000ML POUR BTL (IV SOLUTION) ×2 IMPLANT
PACK SHOULDER (CUSTOM PROCEDURE TRAY) ×2 IMPLANT
PAD COLD SHLDR WRAP-ON (PAD) IMPLANT
PIN GUIDE 3X75 SHOULDER (PIN) IMPLANT
REAMER ROD DEEP FLUTE 2.5X950 (INSTRUMENTS) ×1 IMPLANT
RESTRAINT HEAD UNIVERSAL NS (MISCELLANEOUS) ×2 IMPLANT
SCREW 5.0X38 SMALL F/PERFORM (Screw) ×1 IMPLANT
SCREW 5.5X22 (Screw) ×3 IMPLANT
SCREW BONE INTRNL SM 7 (Screw) ×1 IMPLANT
SET HNDPC FAN SPRY TIP SCT (DISPOSABLE) ×1 IMPLANT
SHOULDER SYSTEM 33 OD 12.5DEG (Joint) ×2 IMPLANT
SLING ARM FOAM STRAP MED (SOFTGOODS) ×1 IMPLANT
SLING ULTRA III MED (ORTHOPEDIC SUPPLIES) ×1 IMPLANT
SMARTMIX MINI TOWER (MISCELLANEOUS) ×2
SPHERE GLENOID LATERALIZED 33 (Joint) IMPLANT
SPONGE LAP 18X18 RF (DISPOSABLE) IMPLANT
STEM HUM AEQUALIS STD SZ1B 66 (Stem) ×1 IMPLANT
SUCTION FRAZIER HANDLE 12FR (TUBING) ×2
SUCTION TUBE FRAZIER 12FR DISP (TUBING) ×1 IMPLANT
SUT ETHIBOND 2 V 37 (SUTURE) ×2 IMPLANT
SUT ETHIBOND NAB CT1 #1 30IN (SUTURE) ×2 IMPLANT
SUT FIBERWIRE #5 38 CONV NDL (SUTURE)
SUT FORCE FIBER 2 HI STR (SUTURE) ×1 IMPLANT
SUT MNCRL AB 4-0 PS2 18 (SUTURE) ×2 IMPLANT
SUT VIC AB 0 CT1 36 (SUTURE) ×2 IMPLANT
SUT VIC AB 3-0 CT1 27 (SUTURE) ×2
SUT VIC AB 3-0 CT1 TAPERPNT 27 (SUTURE) ×1 IMPLANT
SUT VIC AB 3-0 SH 27 (SUTURE)
SUT VIC AB 3-0 SH 27X BRD (SUTURE) IMPLANT
SUTURE FIBERWR #5 38 CONV NDL (SUTURE) IMPLANT
SYSTEM SHOULDER 33 OD 12.5DEG (Joint) IMPLANT
TOWEL OR 17X26 10 PK STRL BLUE (TOWEL DISPOSABLE) ×2 IMPLANT
TOWER CARTRIDGE SMART MIX (DISPOSABLE) IMPLANT
TOWER SMARTMIX MINI (MISCELLANEOUS) ×1 IMPLANT
WATER STERILE IRR 1000ML POUR (IV SOLUTION) ×2 IMPLANT

## 2020-03-18 NOTE — Anesthesia Preprocedure Evaluation (Addendum)
Anesthesia Evaluation  Patient identified by MRN, date of birth, ID band Patient awake    Reviewed: Allergy & Precautions, NPO status , Patient's Chart, lab work & pertinent test results  Airway Mallampati: II  TM Distance: >3 FB Neck ROM: Full    Dental no notable dental hx. (+)    Pulmonary COPD, former smoker,    Pulmonary exam normal breath sounds clear to auscultation       Cardiovascular negative cardio ROS Normal cardiovascular exam Rhythm:Regular Rate:Normal  TTE 2003 Normal LVEF, no significant valvular abnormalities   Neuro/Psych negative neurological ROS  negative psych ROS   GI/Hepatic Neg liver ROS, GERD  ,  Endo/Other  negative endocrine ROS  Renal/GU negative Renal ROS  negative genitourinary   Musculoskeletal negative musculoskeletal ROS (+)   Abdominal   Peds  Hematology  (+) Blood dyscrasia (Hgb 8.6, last pain crisis 1 week ago, did not require admission, no h/o acute chest or aplastic crisis), Sickle cell anemia and anemia ,   Anesthesia Other Findings   Reproductive/Obstetrics                            Anesthesia Physical Anesthesia Plan  ASA: II  Anesthesia Plan: General and Regional   Post-op Pain Management:  Regional for Post-op pain   Induction: Intravenous  PONV Risk Score and Plan: 2 and Midazolam, Dexamethasone and Ondansetron  Airway Management Planned: Oral ETT  Additional Equipment:   Intra-op Plan:   Post-operative Plan: Extubation in OR  Informed Consent: I have reviewed the patients History and Physical, chart, labs and discussed the procedure including the risks, benefits and alternatives for the proposed anesthesia with the patient or authorized representative who has indicated his/her understanding and acceptance.     Dental advisory given  Plan Discussed with: CRNA  Anesthesia Plan Comments:         Anesthesia Quick  Evaluation

## 2020-03-18 NOTE — Anesthesia Procedure Notes (Signed)

## 2020-03-18 NOTE — Anesthesia Procedure Notes (Addendum)
Anesthesia Regional Block: Interscalene brachial plexus block   Pre-Anesthetic Checklist: ,, timeout performed, Correct Patient, Correct Site, Correct Laterality, Correct Procedure, Correct Position, site marked, Risks and benefits discussed,  Surgical consent,  Pre-op evaluation,  At surgeon's request and post-op pain management  Laterality: Left  Prep: Maximum Sterile Barrier Precautions used, chloraprep       Needles:  Injection technique: Single-shot  Needle Type: Echogenic Stimulator Needle     Needle Length: 4cm  Needle Gauge: 22     Additional Needles:   Procedures:,,,, ultrasound used (permanent image in chart),,,,  Narrative:  Start time: 03/18/2020 7:51 AM End time: 03/18/2020 8:01 AM Injection made incrementally with aspirations every 5 mL.  Performed by: Personally  Anesthesiologist: Elmer Picker, MD  Additional Notes: Monitors applied. No increased pain on injection. No increased resistance to injection. Injection made in 5cc increments. Good needle visualization. Patient tolerated procedure well.

## 2020-03-18 NOTE — Op Note (Signed)
Orthopaedic Surgery Operative Note (CSN: [id number])  [name]  [birth year] Date of Surgery: 03/18/2020   Diagnoses:  Left shoulder AVN with collapse  Procedure: Left reverse Total Shoulder Arthroplasty - Modifier 22   Operative Finding Successful completion of procedure.  We had initially planned on performing an anatomic total shoulder arthroplasty however the patient's subscapularis was essentially nonexistent though the superior cuff was reasonable.  The patient's extreme levels of medialization as well as proximal migration in conjunction with his sclerotic narrow proximal canal made this case extraordinarily difficult.  From a time standpoint this case took at least twice as long as atypical difficult case and patient's bone quality and age made it difficult to get an implant in.  We did implant and explant a 36 glenosphere as well as a ingrowth post as the patient's sclerotic bone in his glenoid as well as his humerus made it very difficult to obtain good fixation while getting it reduced.  Again this was a very difficult case and atypical.  He is at high level of risk for fracture as well as infection.  If the patient had an issue with his fixation or needed revision he would likely need custom implants or a series of reamers that were end-cutting as well as a bur.  There would be consideration to cut a long stemmed implant as the geometry of his proximal humerus was not amenable to typical stems.   Post-operative plan: The patient will be NWB in sling.  The patient will be will be admitted to observation due to medical complexity, monitoring and pain management.  We will consult medicine for assistance with DVT prophylaxis.   Pain control with PRN pain medication preferring oral medicines.  Follow up plan will be scheduled in approximately 7 days for incision check and XR.  Physical therapy to start after 4 weeks.  Implants: Short size 1 Tornier flex stem, 0 high offset tray with  33 x 6 poly-, 33 glenosphere, 25 baseplate, 4 peripheral screws and no central screw or peg.  Post-Op Diagnosis: Same Surgeons:Primary: Bjorn Pippin, MD Assistants:Caroline McBane PA-C Location: [facility] ROOM 06 Anesthesia: General with Exparel Interscalene Antibiotics: Ancef 2g preop, Vancomycin 1000mg  locally, 1.2 g tobramycin powder Tourniquet time: None Estimated Blood Loss: 150 Complications: None Specimens: None Implants: Implant Name Type Inv. Item Serial No. Manufacturer Lot No. LRB No. Used Action  BASEPLATE GLENOSPHERE STD - Miscellaneous BASEPLATE GLENOSPHERE [id number] STD  [employer] Left 1 Implanted  SCREW 5.5X22 - [id number] Screw SCREW 5.5X22  [employer]  Left 3 Implanted  SCREW 5.0X38 SMALL F/PERFORM - [id number] Screw SCREW 5.0X38 SMALL F/PERFORM  [employer]  Left 1 Implanted  LATERALIZED GLENOSPHERE [id number] LAT=+3 REF     #[id number] Left 1 Implanted  STEM HUM AEQUALIS STD SZ1B 66 - [id number] Stem STEM HUM AEQUALIS STD SZ1B 66  [employer] [id number] Left 1 Implanted  REVERSED INSERT [id number] THICK=+6MM REF#[id number]     Left 1 Implanted  IMPLANT REVERSE SHOULDER 0X3.5 - [id number] Shoulder IMPLANT REVERSE SHOULDER 0X3.5  [employer] [id number] Left 1 Implanted    Indications for Surgery:   [name] is a 49 y.o. 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  He eventually had motion that essentially had autofused his glenohumeral joint and he was frustrated with inability to range the shoulder.  Benefits and risks of operative and nonoperative management were discussed prior to surgery with patient/guardian(s) and informed  consent form was completed.  Infection and need for further surgery were discussed as was prosthetic stability and cuff issues.  We additionally specifically discussed risks of axillary nerve injury, infection, periprosthetic fracture, continued pain and longevity of  implants prior to beginning procedure.      Procedure:   The patient was identified in the preoperative holding area where the surgical site was marked. Block placed by anesthesia with exparel.  The patient was taken to the OR where a procedural timeout was called and the above noted anesthesia was induced.  The patient was positioned beachchair on allen table with spider arm positioner.  Preoperative antibiotics were dosed.  The patient's left shoulder was prepped and draped in the usual sterile fashion.  A second preoperative timeout was called.      Standard deltopectoral approach was performed with a #10 blade. We dissected down to the subcutaneous tissues and the cephalic vein was taken laterally with the deltoid. Clavipectoral fascia was incised in line with the incision. Deep retractors were placed. The long of the biceps tendon was identified and there was significant tenosynovitis present.  Tenodesis was performed to the pectoralis tendon with #2 Ethibond. The remaining biceps was followed up into the rotator interval where it was released.   There was essentially no real subscapularis tissue left in the patient's anatomy had left his humerus dislocated anteriorly and his had collapsed with extreme shortening of the soft tissues.  We considered performing a osteotomy of the coracoid however we did not feel this was necessary for exposure.  We were able to carefully exposed and skeletonized the proximal humerus protecting the axillary nerve as we progressed.  There essentially was no subscapularis to place sutures in.  We were able to use a rondure and osteotome to carefully remove osteophytes from the inferior portion of the humerus.  There are some osteophytes that were in the area of the axillary nerve and we did not feel that these would be pertinent for the patient's overall outcome.  Once we had osteophytes resected we are able to carefully attempt to dislocate the joint that we had to  perform essentially a partial osteotomy of the anterior half of the humerus in order to allow rotation.  Extensive soft tissue releases were performed in order to allow dislocation and view of the humeral surface.  Essentially the humeral head had died and collapsed and there was very little in the way of cartilage left and there was no sphericity to the remaining structure at all.  In fact there was essentially caved in.  We were able to perform a head cut of sorts however we immediately were able to see the total shoulder arthroplasty was not going to be able to be performed.  The bone of the proximal humerus was essentially sclerotic and hard and not amenable to easy broaching.  We knew that the proximal humerus itself was atypically formed and very narrow with essentially sclerotic bone narrowing the canal to less than 10 mm.  We used a series of drills as well as curettes and a bur to open the proximal humerus.  We then used a series of reamers over the course of 30 to 40 minutes to open the proximal humerus enough to accept even a 1 stem.  Eventually were able to place a 1 stem somewhat proud.  We did consider a long versus short stem and switch back and forth with our trials.  We ended up settling on a 1 short stem.  Once we had broached and placed this we remove the stem to obtain as much exposure as we could.    The humerus was retracted posteriorly and we turned our attention to glenoid exposure.  The subscapularis remnant was again identified and immediately we took care to palpate the axillary nerve anteriorly and verify its position with gentle palpation as well as the tug test.  We then released the SGHL with bovie cautery prior to placing a curved mayo at the junction of the anterior glenoid well above the axillary nerve and bluntly dissecting the subscapularis from the capsule.  We then carefully protected the axillary nerve as we gently released the inferior capsule to fully mobilize the  subscapularis.  An anterior deltoid retractor was then placed as well as a small Hohmann retractor superiorly.   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  There is significant anterior wear consistent with preoperative CT and blueprint planning.  The remaining labrum was removed circumferentially taking great care not to disrupt the posterior capsule.   We did use blueprint actively in the operating room to position implants and get an assessment of where we could place a reverse implant.  The glenoid drill guide was placed and used to drill a guide pin in the center, inferior position. The glenoid face was then reamed concentrically over the guide wire. The center hole was drilled over the guidepin in a near anatomic angle of version.  It was clear the vault was so small that we would not be able to get a screw to obtain fixation and additionally we did not feel that a screw would provide good purchase.  We drilled for a center post.  The sclerotic bone made it very difficult to drill for this as well.  Once we prepared the surface we attempted to place our 25 mm baseplate with 0 lateralization and a short 7 mm post however the post itself was hanging on the sclerotic bone.  We were worried that we did fracture the glenoid and thus remove the post and placed our implant as it was.  Placed our 2 nonlocking screws in typical fashion with good position of the baseplate as well as good purchase.  We then placed her 2 locking screws.  Were happy with her overall glenoid fixation.  We placed a 36 glenosphere initially.   We turned attention back to the humeral side.  At this point we placed our stem and attempted to reduce the joint however it was clear that the conjoined tendon as well as the tension of the soft tissues was preventing this.  That point we attempted to carefully bur the canal and broached down to  perform a additional neck resection.  Once we had an appropriate resection we are able to place a short stem.  It was still clear that we would be able to reduce with a 36 glenosphere.  We then withdrew our trials and switched to a 36 glenosphere for a 33+3.  That point were able to put in our trials again selecting a 0 high offset tray placed at the 12 o'clock position in its least distal eyes position and were able to perch the tray.  The real humeral implants were opened after again confirming sizes.  There was essentially no subscapularis able to be repaired so we did not place sutures.  The humeral component was press-fit obtaining a secure fit. A +0 high offset  tray was selected and impacted onto the stem.  A 33+6 polyethylene liner was impacted onto the stem.  The joint was reduced and thoroughly irrigated with pulsatile lavage.  Local antibiotics were placed.  The deltopectoral interval was reapproximated with #1 Ethibond. The subcutaneous tissues were closed with 2-0 Vicryl and the skin was closed with running monocryl.    The wounds were cleaned and dried and an Aquacel dressing was placed. The drapes taken down. The arm was placed into sling with abduction pillow. Patient was awakened, extubated, and transferred to the recovery room in stable condition. There were no intraoperative complications. The sponge, needle, and attention counts were  correct at the end of the case.   Noemi Chapel, PA-C, present and scrubbed throughout the case, critical for completion in a timely fashion, and for retraction, instrumentation, closure.

## 2020-03-18 NOTE — Interval H&P Note (Signed)
All questions answered. Patient like to proceed

## 2020-03-18 NOTE — Transfer of Care (Signed)
Immediate Anesthesia Transfer of Care Note  Patient: Delford Wingert  Procedure(s) Performed: TOTAL SHOULDER ARTHROPLASTY (Left Shoulder)  Patient Location: PACU  Anesthesia Type:General  Level of Consciousness: awake, alert , oriented and patient cooperative  Airway & Oxygen Therapy: Patient Spontanous Breathing and Patient connected to face mask oxygen  Post-op Assessment: Report given to RN, Post -op Vital signs reviewed and stable and Patient moving all extremities  Post vital signs: Reviewed and stable  Last Vitals:  Vitals Value Taken Time  BP 151/97 03/18/20 1304  Temp    Pulse 122 03/18/20 1307  Resp 21 03/18/20 1307  SpO2 100 % 03/18/20 1307  Vitals shown include unvalidated device data.  Last Pain:  Vitals:   03/18/20 0709  TempSrc: Oral  PainSc:          Complications: No apparent anesthesia complications

## 2020-03-19 DIAGNOSIS — M879 Osteonecrosis, unspecified: Secondary | ICD-10-CM | POA: Diagnosis present

## 2020-03-19 DIAGNOSIS — Z885 Allergy status to narcotic agent status: Secondary | ICD-10-CM | POA: Diagnosis not present

## 2020-03-19 DIAGNOSIS — Z20822 Contact with and (suspected) exposure to covid-19: Secondary | ICD-10-CM | POA: Diagnosis present

## 2020-03-19 DIAGNOSIS — Z87891 Personal history of nicotine dependence: Secondary | ICD-10-CM | POA: Diagnosis not present

## 2020-03-19 DIAGNOSIS — M19012 Primary osteoarthritis, left shoulder: Secondary | ICD-10-CM | POA: Diagnosis present

## 2020-03-19 DIAGNOSIS — D5700 Hb-SS disease with crisis, unspecified: Secondary | ICD-10-CM | POA: Diagnosis not present

## 2020-03-19 DIAGNOSIS — D57219 Sickle-cell/Hb-C disease with crisis, unspecified: Secondary | ICD-10-CM | POA: Diagnosis present

## 2020-03-19 DIAGNOSIS — Z09 Encounter for follow-up examination after completed treatment for conditions other than malignant neoplasm: Secondary | ICD-10-CM | POA: Diagnosis not present

## 2020-03-19 DIAGNOSIS — Z79899 Other long term (current) drug therapy: Secondary | ICD-10-CM | POA: Diagnosis not present

## 2020-03-19 DIAGNOSIS — J449 Chronic obstructive pulmonary disease, unspecified: Secondary | ICD-10-CM | POA: Diagnosis present

## 2020-03-19 DIAGNOSIS — K219 Gastro-esophageal reflux disease without esophagitis: Secondary | ICD-10-CM | POA: Diagnosis present

## 2020-03-19 DIAGNOSIS — N529 Male erectile dysfunction, unspecified: Secondary | ICD-10-CM | POA: Diagnosis present

## 2020-03-19 DIAGNOSIS — M659 Synovitis and tenosynovitis, unspecified: Secondary | ICD-10-CM | POA: Diagnosis present

## 2020-03-19 LAB — CBC
HCT: 18.5 % — ABNORMAL LOW (ref 39.0–52.0)
Hemoglobin: 5.7 g/dL — CL (ref 13.0–17.0)
MCH: 18.4 pg — ABNORMAL LOW (ref 26.0–34.0)
MCHC: 30.8 g/dL (ref 30.0–36.0)
MCV: 59.9 fL — ABNORMAL LOW (ref 80.0–100.0)
Platelets: 191 10*3/uL (ref 150–400)
RBC: 3.09 MIL/uL — ABNORMAL LOW (ref 4.22–5.81)
RDW: 21.8 % — ABNORMAL HIGH (ref 11.5–15.5)
WBC: 7.4 10*3/uL (ref 4.0–10.5)
nRBC: 0 % (ref 0.0–0.2)

## 2020-03-19 LAB — IRON AND TIBC
Iron: 30 ug/dL — ABNORMAL LOW (ref 45–182)
Saturation Ratios: 7 % — ABNORMAL LOW (ref 17.9–39.5)
TIBC: 409 ug/dL (ref 250–450)
UIBC: 379 ug/dL

## 2020-03-19 LAB — CBC WITH DIFFERENTIAL/PLATELET
Abs Immature Granulocytes: 0.05 10*3/uL (ref 0.00–0.07)
Basophils Absolute: 0 10*3/uL (ref 0.0–0.1)
Basophils Relative: 0 %
Eosinophils Absolute: 0 10*3/uL (ref 0.0–0.5)
Eosinophils Relative: 1 %
HCT: 25.5 % — ABNORMAL LOW (ref 39.0–52.0)
Hemoglobin: 8.1 g/dL — ABNORMAL LOW (ref 13.0–17.0)
Immature Granulocytes: 1 %
Lymphocytes Relative: 14 %
Lymphs Abs: 1.1 10*3/uL (ref 0.7–4.0)
MCH: 21.5 pg — ABNORMAL LOW (ref 26.0–34.0)
MCHC: 31.8 g/dL (ref 30.0–36.0)
MCV: 67.6 fL — ABNORMAL LOW (ref 80.0–100.0)
Monocytes Absolute: 0.5 10*3/uL (ref 0.1–1.0)
Monocytes Relative: 6 %
Neutro Abs: 6.3 10*3/uL (ref 1.7–7.7)
Neutrophils Relative %: 78 %
Platelets: 153 10*3/uL (ref 150–400)
RBC: 3.77 MIL/uL — ABNORMAL LOW (ref 4.22–5.81)
RDW: 27.9 % — ABNORMAL HIGH (ref 11.5–15.5)
WBC: 8 10*3/uL (ref 4.0–10.5)
nRBC: 0 % (ref 0.0–0.2)

## 2020-03-19 LAB — LACTATE DEHYDROGENASE: LDH: 205 U/L — ABNORMAL HIGH (ref 98–192)

## 2020-03-19 LAB — FERRITIN: Ferritin: 9 ng/mL — ABNORMAL LOW (ref 24–336)

## 2020-03-19 LAB — HEMOGLOBIN AND HEMATOCRIT, BLOOD
HCT: 17.8 % — ABNORMAL LOW (ref 39.0–52.0)
Hemoglobin: 5.4 g/dL — CL (ref 13.0–17.0)

## 2020-03-19 LAB — PREPARE RBC (CROSSMATCH)

## 2020-03-19 MED ORDER — DIPHENHYDRAMINE HCL 25 MG PO CAPS: 25.0000 mg | ORAL_CAPSULE | Freq: Once | ORAL | Status: AC

## 2020-03-19 MED ORDER — HYDROMORPHONE 1 MG/ML IV SOLN
INTRAVENOUS | Status: DC
  Administered 2020-03-19: 4 mg via INTRAVENOUS
  Administered 2020-03-19: 1.5 mg via INTRAVENOUS
  Administered 2020-03-19: 30 mg via INTRAVENOUS
  Administered 2020-03-20: 1.5 mg via INTRAVENOUS
  Filled 2020-03-19: qty 30

## 2020-03-19 MED ORDER — OXYCODONE HCL 5 MG PO TABS
10.0000 mg | ORAL_TABLET | ORAL | Status: DC | PRN
  Administered 2020-03-20: 10 mg via ORAL
  Filled 2020-03-19: qty 2

## 2020-03-19 MED ORDER — SODIUM CHLORIDE 0.9% FLUSH: 9.0000 mL | INTRAVENOUS | Status: DC | PRN

## 2020-03-19 MED ORDER — SODIUM CHLORIDE 0.9 % IV SOLN
12.5000 mg | Freq: Once | INTRAVENOUS | Status: AC
  Administered 2020-03-19: 12.5 mg via INTRAVENOUS
  Filled 2020-03-19 (×2): qty 0.25

## 2020-03-19 MED ORDER — SODIUM CHLORIDE 0.9% IV SOLUTION: Freq: Once | INTRAVENOUS | Status: DC

## 2020-03-19 MED ORDER — KETOROLAC TROMETHAMINE 30 MG/ML IJ SOLN
15.0000 mg | Freq: Four times a day (QID) | INTRAMUSCULAR | Status: DC
  Administered 2020-03-19 – 2020-03-20 (×2): 15 mg via INTRAVENOUS
  Filled 2020-03-19 (×2): qty 1

## 2020-03-19 MED ORDER — ONDANSETRON HCL 4 MG/2ML IJ SOLN: 4.0000 mg | Freq: Four times a day (QID) | INTRAMUSCULAR | Status: DC | PRN

## 2020-03-19 MED ORDER — HYDROMORPHONE HCL 1 MG/ML IJ SOLN: 1.0000 mg | INTRAMUSCULAR | Status: DC | PRN

## 2020-03-19 MED ORDER — NALOXONE HCL 0.4 MG/ML IJ SOLN: 0.4000 mg | INTRAMUSCULAR | Status: DC | PRN

## 2020-03-19 MED ORDER — HYDROMORPHONE HCL 1 MG/ML IJ SOLN
2.0000 mg | Freq: Once | INTRAMUSCULAR | Status: AC
  Administered 2020-03-19: 2 mg via INTRAVENOUS
  Filled 2020-03-19: qty 2

## 2020-03-19 NOTE — Progress Notes (Signed)
OT Cancellation Note  Patient Details Name: Morrie Daywalt MRN: [id number] DOB: 12/13/1970   Cancelled Treatment:    Reason Eval/Treat Not Completed: Pain limiting ability to participate;Medical issues which prohibited therapy RN reports patient in significant pain potentially having a sickle cell crisis. Requests therapy hold for now and attempt in afternoon if patient medically stable. Guenevere Roorda L Wilfred Siverson 03/19/2020, 11:06 AM

## 2020-03-19 NOTE — Progress Notes (Signed)
Hgb results verified by lab ( 5.4; actually slightly less than previous result).  It was noted that the patient did not have a blood consent.  Upon presenting the patient with the consent, explaining to him his Hgb result and the PA's orders, the patient stated he wished to "speak with his doctor before he makes a decision about a transfusion'.

## 2020-03-19 NOTE — Progress Notes (Signed)
Cancel consult, site obtained by rapid response RN.

## 2020-03-19 NOTE — Progress Notes (Signed)
On hold for 20+ minutes with physician office, trying to contact PA/MD about pain medications for this patient.    Finally, was given cell phone numbers of both individuals, who did not answer cell phones.    Called office back a second time, and they forwarded me to the surgery scheduler. She informed me that he is at [facility] and provided the [facility] phone number [phone].    Called this number, and after being transferred 3 times, spoke with OR RN, who reported medications given to patient including, 15mg  PO oxy, 1mg  of IV dilaudid, and 500mg  of PO robaxin.   MD gave RN a one time order for 2mg  dilaudid,  RN also requested increasing IV dilaudid from 1mg  to 2mg  Q4 prn. Circulating nurse said "well just try what he just said, and called me back". I said "no ma'am, please ask him now, because ive been trying to reach him for 40 minutes, and I am not calling again."   I am including this information because this patient haas been suffering for 45 minutes to an hour with severe uncontrolled 10/10 pain.    She asked him, and Dr. agreed.   IV pain medication given to patient.

## 2020-03-19 NOTE — Anesthesia Postprocedure Evaluation (Signed)
Anesthesia Post Note  Patient: [name]  Procedure(s) Performed: TOTAL SHOULDER ARTHROPLASTY (Left Shoulder)     Patient location during evaluation: PACU Anesthesia Type: Regional and General Level of consciousness: awake and alert Pain management: pain level controlled Vital Signs Assessment: post-procedure vital signs reviewed and stable Respiratory status: spontaneous breathing, nonlabored ventilation, respiratory function stable and patient connected to nasal cannula oxygen Cardiovascular status: blood pressure returned to baseline and stable Postop Assessment: no apparent nausea or vomiting Anesthetic complications: no    Last Vitals:  Vitals:   03/19/20 1135 03/19/20 1424  BP: 106/66 (!) 124/101  Pulse: 86 87  Resp: 18 12  Temp: 37.2 C 37.1 C  SpO2: 100% 100%    Last Pain:  Vitals:   03/19/20 1424  TempSrc: Oral  PainSc:                  Brixton Schnapp L Annais Crafts

## 2020-03-19 NOTE — Progress Notes (Signed)
OT Cancellation Note  Patient Details Name: Jermale Crass MRN: [id number] DOB: [birth year]   Cancelled Treatment:    Reason Eval/Treat Not Completed: Pain limiting ability to participate;Medical issues which prohibited therapy(HgB 5.7. Pt scheduled to receive blood transfusion.) Will attempt to see later.   Thornell Mule, OT/L   Acute OT Clinical Specialist Acute Rehabilitation Services Pager [phone] Office [phone]  03/19/2020, 9:25 AM

## 2020-03-19 NOTE — Consult Note (Signed)
Reason for Consult: Sickle cell pain crisis Referring Physician: Dr. Ramond Marrow  [name] is an 49 y.o. male.  HPI:  [name], 49 year old male with a medical history significant for sickle cell disease type [place], avascular necrosis of left femoral head, history of gastritis, history of osteoarthritis, history of tobacco dependence presented 03/18/2020 for a total left shoulder arthroplasty. Today, patient's hemoglobin has decreased to 5.6.  His baseline is around 7.0-8.0 g/dL.  Patient endorses some shortness of breath, oxygen saturation 98% on RA. Patient is complaining of increased left shoulder pain.  Pain has not been controlled medication regimen per orthopedic surgery.  Pain intensity is 10/10 characterized as constant and throbbing.  Patient says that he has not been able to get comfortable.  Also, patient is complaining of some throat discomfort and difficulty swallowing. He is afebrile.  He denies chest pain, cough, urinary symptoms, nausea, vomiting, or diarrhea.  Past Medical History:  Diagnosis Date  . Avascular necrosis of left femoral head (HCC)   . Erectile dysfunction   . Gastritis and duodenitis   . GERD (gastroesophageal reflux disease)   . Low testosterone   . Mental disorder   . Mixed restrictive and obstructive lung disease (HCC)   . Osteoarthrosis, unspecified whether generalized or localized, unspecified site   . Pain in joint, site unspecified   . Physical exam, annual 02/18/2010  . Sickle cell anemia (HCC)   . Tobacco use     Past Surgical History:  Procedure Laterality Date  . ELECTROCARDIOGRAM  01/31/2011  . ESOPHAGOGASTRODUODENOSCOPY  01/03/2013   Procedure: ESOPHAGOGASTRODUODENOSCOPY (EGD);  Surgeon: Meryl Dare, MD,FACG;  Location: [facility];  Service: Endoscopy;  Laterality: N/A;  . UPPER GASTROINTESTINAL ENDOSCOPY  11/18/2010    History reviewed. No pertinent family history.  Social History:  reports that he has quit smoking. He  has never used smokeless tobacco. He reports current alcohol use. He reports that he does not use drugs.  Allergies: No Known Allergies   Results for orders placed or performed during the hospital encounter of 03/18/20 (from the past 48 hour(s))  Type and screen [facility]     Status: None (Preliminary result)   Collection Time: 03/18/20 11:40 AM  Result Value Ref Range   ABO/RH(D) O POS    Antibody Screen POS    Sample Expiration 03/21/2020,2359    Antibody Identification ANTI E ANTI K    Unit Number [id number]    Blood Component Type RBC LR PHER1    Unit division 00    Status of Unit ISSUED    Donor AG Type      NEGATIVE FOR E ANTIGEN NEGATIVE FOR KELL ANTIGEN NEGATIVE FOR C ANTIGEN   Transfusion Status OK TO TRANSFUSE    Crossmatch Result COMPATIBLE    Unit Number [id number]    Blood Component Type RED CELLS,LR    Unit division 00    Status of Unit ALLOCATED    Donor AG Type      NEGATIVE FOR E ANTIGEN NEGATIVE FOR KELL ANTIGEN NEGATIVE FOR C ANTIGEN   Transfusion Status OK TO TRANSFUSE    Crossmatch Result COMPATIBLE   CBC     Status: Abnormal   Collection Time: 03/18/20  1:35 PM  Result Value Ref Range   WBC 8.6 4.0 - 10.5 K/uL   RBC 3.95 (L) 4.22 - 5.81 MIL/uL   Hemoglobin 7.3 (L) 13.0 - 17.0 g/dL    Comment: Reticulocyte Hemoglobin testing may be clinically indicated, consider  ordering this additional test [id number]    HCT 23.4 (L) 39.0 - 52.0 %   MCV 59.2 (L) 80.0 - 100.0 fL   MCH 18.5 (L) 26.0 - 34.0 pg   MCHC 31.2 30.0 - 36.0 g/dL   RDW 21.8 (H) 11.5 - 15.5 %   Platelets 203 150 - 400 K/uL   nRBC 0.0 0.0 - 0.2 %    Comment: Performed at [facility], [place] [address] [place], [place] [address]  Creatinine, serum     Status: None   Collection Time: 03/18/20  1:35 PM  Result Value Ref Range   Creatinine, Ser 1.18 0.61 - 1.24 mg/dL   GFR calc non Af Amer >60 >60 mL/min   GFR calc Af Amer >60 >60 mL/min    Comment:  Performed at [facility], [place] [address] [place], [place] [address]  CBC     Status: Abnormal   Collection Time: 03/19/20  3:20 AM  Result Value Ref Range   WBC 7.4 4.0 - 10.5 K/uL   RBC 3.09 (L) 4.22 - 5.81 MIL/uL   Hemoglobin 5.7 (LL) 13.0 - 17.0 g/dL    Comment: Reticulocyte Hemoglobin testing may be clinically indicated, consider ordering this additional test [id number] THIS CRITICAL RESULT HAS VERIFIED AND BEEN CALLED TO PAM, RN BY CHELSEA VARNER ON 04 22 2021 AT 0400, AND HAS BEEN READ BACK. CRITICAL RESULT VERIFIED    HCT 18.5 (L) 39.0 - 52.0 %   MCV 59.9 (L) 80.0 - 100.0 fL   MCH 18.4 (L) 26.0 - 34.0 pg   MCHC 30.8 30.0 - 36.0 g/dL   RDW 21.8 (H) 11.5 - 15.5 %   Platelets 191 150 - 400 K/uL   nRBC 0.0 0.0 - 0.2 %    Comment: Performed at [facility], [place] [address] [place], [place] [address]  Hemoglobin and hematocrit, blood     Status: Abnormal   Collection Time: 03/19/20  4:41 AM  Result Value Ref Range   Hemoglobin 5.4 (LL) 13.0 - 17.0 g/dL    Comment: CRITICAL VALUE NOTED.  VALUE IS CONSISTENT WITH PREVIOUSLY REPORTED AND CALLED VALUE. REPEATED TO VERIFY    HCT 17.8 (L) 39.0 - 52.0 %    Comment: Performed at [facility], [place] [address], [place], [place] [address]  Ferritin     Status: Abnormal   Collection Time: 03/19/20  8:09 AM  Result Value Ref Range   Ferritin 9 (L) 24 - 336 ng/mL    Comment: Performed at [facility], [place] [address] [place], [place] [address]  Iron and TIBC     Status: Abnormal   Collection Time: 03/19/20  8:09 AM  Result Value Ref Range   Iron 30 (L) 45 - 182 ug/dL   TIBC 409 250 - 450 ug/dL   Saturation Ratios 7 (L) 17.9 - 39.5 %   UIBC 379 ug/dL    Comment: Performed at [facility], [place] [address], [place], [place] [address]  Prepare RBC (crossmatch)     Status: None   Collection Time: 03/19/20  8:09 AM  Result Value Ref Range   Order  Confirmation      ORDER PROCESSED BY BLOOD BANK Performed at [facility], [place] [address] [place], [place] [address]     DG Shoulder Left Port  Result Date: 03/18/2020 CLINICAL DATA:  Left shoulder replacement EXAM: LEFT SHOULDER COMPARISON:  01/24/2020 FINDINGS: Single frontal view of  the left shoulder demonstrates total left shoulder arthroplasty in the expected position without signs of acute complication. Postsurgical changes are seen in the soft tissues. Left chest is clear. IMPRESSION: 1. Unremarkable left shoulder arthroplasty. Electronically Signed   By: Sharlet Salina M.D.   On: 03/18/2020 16:00    Review of Systems  Constitutional: Negative for chills, fever and malaise/fatigue.  HENT: Negative.   Eyes: Negative.   Respiratory: Negative for sputum production and shortness of breath.   Cardiovascular: Negative for chest pain.  Gastrointestinal: Negative.   Musculoskeletal: Positive for joint pain (Left shoulder).  Skin: Negative.   Neurological: Negative.   Psychiatric/Behavioral: Negative.     Review of Systems  Constitutional: Negative for chills, fever and malaise/fatigue.  HENT: Negative.   Eyes: Negative.   Respiratory: Negative for sputum production and shortness of breath.   Cardiovascular: Negative for chest pain.  Gastrointestinal: Negative.   Musculoskeletal: Positive for joint pain (Left shoulder).  Skin: Negative.   Neurological: Negative.   Endo/Heme/Allergies: Negative.   Psychiatric/Behavioral: Negative.     Blood pressure 115/67, pulse 83, temperature 98.7 F (37.1 C), temperature source Oral, resp. rate 20, height 5\' 5"  (1.651 m), weight 69.4 kg, SpO2 100 %. Physical Exam BP 115/67 (BP Location: Right Arm)   Pulse 83   Temp 98.7 F (37.1 C) (Oral)   Resp 16   Ht 5\' 5"  (1.651 m)   Wt 69.4 kg   SpO2 92%   BMI 25.46 kg/m   General Appearance:    Alert, cooperative. Patient writhing in pain, moderate distress  Head:     Normocephalic, without obvious abnormality, atraumatic  Eyes:    PERRL, conjunctiva/corneas clear, EOM's intact, fundi    benign, both eyes       Throat:   Lips, mucosa, and tongue normal; teeth and gums normal.  No swelling   Neck:   Supple, symmetrical, trachea midline, no adenopathy;       thyroid:  No enlargement/tenderness/nodules; no carotid   bruit or JVD  Back:     Symmetric, no curvature, ROM normal, no CVA tenderness  Lungs:     Clear to auscultation bilaterally, respirations unlabored  Chest wall:    No tenderness or deformity  Heart:    Regular rate and rhythm, S1 and S2 normal, no murmur, rub   or gallop  Abdomen:     Soft, non-tender, bowel sounds active all four quadrants,    no masses, no organomegaly  Extremities:  S/P left shoulder arthroplasty, sling in place  Pulses:   2+ and symmetric all extremities  Skin:   Skin color, texture, turgor normal, no rashes or lesions  Lymph nodes:   Cervical, supraclavicular, and axillary nodes normal  Neurologic:   CNII-XII intact.  Normal gait   Assessment/Plan: Sickle cell disease with pain crisis: Pain is uncontrolled on IV Dilaudid 2 mg every 4 hours as needed.  Initiate IV Dilaudid PCA.  Patient opiate tolerant.  Settings of 0.5 mg, 10-minute lockout, and 3 mg/h with a 1 mg loading dose. Oxycodone 10 mg every 4 hours as needed for severe breakthrough pain. Toradol 15 mg IV every 6 hours for a total of 5 days Reevaluate pain scale regularly, monitor vital signs closely, and supplemental oxygen as needed Pain will be reevaluated in context of function and relationship to baseline as his care progresses.  Sickle cell anemia: Overnight, patient's hemoglobin decreased to 5.6 g/dL.  His baseline appears to be 7.0-8.0 g/dL.  Transfuse 2 units PRBCs.  Will obtain CBC with differential, CMP, and LDH. Continue folic acid 1 mg daily   Nolon Nations  APRN, MSN, [facility] Patient [facility] [address]  [place], [place] [address] [phone]  03/19/2020, 11:26 AM

## 2020-03-19 NOTE — Consult Note (Signed)
Reason for Consult:Sickle cell disease Referring Physician: Dr. Glennis Brink [name] is an 49 y.o. male.  HPI:   [name], a 49 year old male with a medical history significant for avascular necrosis of left femoral head who underwent left reverse total shoulder arthroplasty on today wihtout complication. Patient also has a history of sickle cell disease type , avascular necrosis of left femoral head, history of gastritis, history of osteoarthritis, history of tobacco dependence, was admitted today for left shoulder arthroplasty.  Patient underwent procedure without complication.  Today, patient's hemoglobin is 7.3, which appears to be slightly below patient's baseline.  Patient states that he feels well and is without complaint.  He has well-controlled sickle cell disease with infrequent pain crisis.  He has a history of chronic pain syndrome, pain is controlled at home on Percocet 10-325 mg every 6 hours as needed. He denies headache, chest pain, dizziness, paresthesias, urinary symptoms, nausea, vomiting, or diarrhea.   Past Medical History:  Diagnosis Date  . Avascular necrosis of left femoral head (HCC)   . Erectile dysfunction   . Gastritis and duodenitis   . GERD (gastroesophageal reflux disease)   . Low testosterone   . Mental disorder   . Mixed restrictive and obstructive lung disease (HCC)   . Osteoarthrosis, unspecified whether generalized or localized, unspecified site   . Pain in joint, site unspecified   . Physical exam, annual 02/18/2010  . Sickle cell anemia (HCC)   . Tobacco use     Past Surgical History:  Procedure Laterality Date  . ELECTROCARDIOGRAM  01/31/2011  . ESOPHAGOGASTRODUODENOSCOPY  01/03/2013   Procedure: ESOPHAGOGASTRODUODENOSCOPY (EGD);  Surgeon: Meryl Dare, MD,FACG;  Location: [facility];  Service: Endoscopy;  Laterality: N/A;  . UPPER GASTROINTESTINAL ENDOSCOPY  11/18/2010    History reviewed. No pertinent family  history.  Social History:  reports that he has quit smoking. He has never used smokeless tobacco. He reports current alcohol use. He reports that he does not use drugs.  Allergies: No Known Allergies    Results for orders placed or performed during the hospital encounter of 03/18/20 (from the past 48 hour(s))  Type and screen [facility]     Status: None (Preliminary result)   Collection Time: 03/18/20 11:40 AM  Result Value Ref Range   ABO/RH(D) O POS    Antibody Screen POS    Sample Expiration 03/21/2020,2359    Antibody Identification ANTI E ANTI K    Unit Number [id number]    Blood Component Type RBC LR PHER1    Unit division 00    Status of Unit ALLOCATED    Donor AG Type      NEGATIVE FOR E ANTIGEN NEGATIVE FOR KELL ANTIGEN NEGATIVE FOR C ANTIGEN   Transfusion Status OK TO TRANSFUSE    Crossmatch Result COMPATIBLE    Unit Number [id number]    Blood Component Type RED CELLS,LR    Unit division 00    Status of Unit ALLOCATED    Donor AG Type      NEGATIVE FOR E ANTIGEN NEGATIVE FOR KELL ANTIGEN NEGATIVE FOR C ANTIGEN   Transfusion Status OK TO TRANSFUSE    Crossmatch Result COMPATIBLE   CBC     Status: Abnormal   Collection Time: 03/18/20  1:35 PM  Result Value Ref Range   WBC 8.6 4.0 - 10.5 K/uL   RBC 3.95 (L) 4.22 - 5.81 MIL/uL   Hemoglobin 7.3 (L) 13.0 - 17.0 g/dL  Comment: Reticulocyte Hemoglobin testing may be clinically indicated, consider ordering this additional test [id number]    HCT 23.4 (L) 39.0 - 52.0 %   MCV 59.2 (L) 80.0 - 100.0 fL   MCH 18.5 (L) 26.0 - 34.0 pg   MCHC 31.2 30.0 - 36.0 g/dL   RDW 09.2 (H) 33.0 - 07.6 %   Platelets 203 150 - 400 K/uL   nRBC 0.0 0.0 - 0.2 %    Comment: Performed at [facility], [address], [place], [place] [address]  Creatinine, serum     Status: None   Collection Time: 03/18/20  1:35 PM  Result Value Ref Range   Creatinine, Ser 1.18 0.61 - 1.24 mg/dL   GFR calc non  Af Amer >60 >60 mL/min   GFR calc Af Amer >60 >60 mL/min    Comment: Performed at [facility], [address], [place], [place] [address]  CBC     Status: Abnormal   Collection Time: 03/19/20  3:20 AM  Result Value Ref Range   WBC 7.4 4.0 - 10.5 K/uL   RBC 3.09 (L) 4.22 - 5.81 MIL/uL   Hemoglobin 5.7 (LL) 13.0 - 17.0 g/dL    Comment: Reticulocyte Hemoglobin testing may be clinically indicated, consider ordering this additional test [id number] THIS CRITICAL RESULT HAS VERIFIED AND BEEN CALLED TO PAM, RN BY CHELSEA VARNER ON 04 22 2021 AT 0400, AND HAS BEEN READ BACK. CRITICAL RESULT VERIFIED    HCT 18.5 (L) 39.0 - 52.0 %   MCV 59.9 (L) 80.0 - 100.0 fL   MCH 18.4 (L) 26.0 - 34.0 pg   MCHC 30.8 30.0 - 36.0 g/dL   RDW 34.2 (H) 87.6 - 81.1 %   Platelets 191 150 - 400 K/uL   nRBC 0.0 0.0 - 0.2 %    Comment: Performed at [facility], [address], [place], [place] [address]  Hemoglobin and hematocrit, blood     Status: Abnormal   Collection Time: 03/19/20  4:41 AM  Result Value Ref Range   Hemoglobin 5.4 (LL) 13.0 - 17.0 g/dL    Comment: CRITICAL VALUE NOTED.  VALUE IS CONSISTENT WITH PREVIOUSLY REPORTED AND CALLED VALUE. REPEATED TO VERIFY    HCT 17.8 (L) 39.0 - 52.0 %    Comment: Performed at [facility], [address], [place], [place] [address]  Ferritin     Status: Abnormal   Collection Time: 03/19/20  8:09 AM  Result Value Ref Range   Ferritin 9 (L) 24 - 336 ng/mL    Comment: Performed at [facility], [address] [place], [place] [address]  Iron and TIBC     Status: Abnormal   Collection Time: 03/19/20  8:09 AM  Result Value Ref Range   Iron 30 (L) 45 - 182 ug/dL   TIBC 845 364 - 680 ug/dL   Saturation Ratios 7 (L) 17.9 - 39.5 %   UIBC 379 ug/dL    Comment: Performed at [facility], [address] [place], [place] [address]  Prepare RBC (crossmatch)     Status: None    Collection Time: 03/19/20  8:09 AM  Result Value Ref Range   Order Confirmation      ORDER PROCESSED BY BLOOD BANK Performed at [facility], [address], [place], [place] [address]     DG Shoulder Left Port  Result Date: 03/18/2020 CLINICAL DATA:  Left shoulder replacement EXAM: LEFT  SHOULDER COMPARISON:  01/24/2020 FINDINGS: Single frontal view of the left shoulder demonstrates total left shoulder arthroplasty in the expected position without signs of acute complication. Postsurgical changes are seen in the soft tissues. Left chest is clear. IMPRESSION: 1. Unremarkable left shoulder arthroplasty. Electronically Signed   By: Randa Ngo M.D.   On: 03/18/2020 16:00    Blood pressure 109/66, pulse 92, temperature 98.6 F (37 C), temperature source Oral, resp. rate 14, height 5\' 5"  (1.651 m), weight 69.4 kg, SpO2 99 %. Physical Exam Constitutional:      Appearance: Normal appearance.  HENT:     Nose: Nose normal.     Mouth/Throat:     Mouth: Mucous membranes are moist.  Eyes:     Pupils: Pupils are equal, round, and reactive to light.  Cardiovascular:     Rate and Rhythm: Normal rate and regular rhythm.  Pulmonary:     Effort: Pulmonary effort is normal.  Abdominal:     General: Bowel sounds are normal.  Skin:    General: Skin is warm.  Neurological:     General: No focal deficit present.     Mental Status: He is alert. Mental status is at baseline.  Psychiatric:        Mood and Affect: Mood normal.        Thought Content: Thought content normal.        Judgment: Judgment normal.     Assessment/plan:  Agree with current plan of care no changes warranted at this time. Hemoglobin is 7.3. If hemoglobin falls below 6.0 g/dL, patient may warrant blood transfusion.    Leonard Pounds  APRN, MSN, FNP-C Patient Care [facility] [address] [place], [place] [address] [phone]  03/19/2020, 10:26 AM

## 2020-03-19 NOTE — Progress Notes (Signed)
   ORTHOPAEDIC PROGRESS NOTE  s/p Procedure(s): RIGHT REVERSE TOTAL SHOULDER ARTHROPLASTY on 03/19/2020 by Dr. Everardo Pacific  SUBJECTIVE: Patient reports pain about operative site. His hemoglobin dropped last night and order for transfusion was placed. Patient is declining transfusion because he says his hemoglobin has dropped before and his PCP controls it with iron. We discussed the low values of his hemoglobin and he requests we contact his PCP prior to a transfusion. He otherwise feels okay except for the pain in his shoulder. No chest pain. No SOB. No nausea/vomiting. No other complaints.  OBJECTIVE: PE: General: alert, oriented, no acute distress Right upper extremity: Dressing CDI and sling well fitting,  full and painless ROM throughout hand with DPC of 0. + Motor in  AIN, PIN, Ulnar distributions. Axillary nerve sensation preserved and symmetric.  Sensation intact in medial, radial, and ulnar distributions. Well perfused digits.   Vitals:   03/19/20 0218 03/19/20 0625  BP: 109/65 101/67  Pulse: 78 87  Resp: 18 14  Temp: 98.7 F (37.1 C) 98.6 F (37 C)  SpO2: 100% 100%     ASSESSMENT: [name] is a 49 y.o. male with history of Sickle Cell Disease status post right reverse total shoulder arthroplasty. POD#1  PLAN: Weightbearing: NWB RUE Insicional and dressing care: Reinforce dressings as needed Orthopedic device(s): Sling Showering: Post-op day #2 with assistance VTE prophylaxis: Lovenox 40mg  qd as discussed with hospitalist team Pain control: PRN pain medications, preferring oral medications Follow - up plan: 1 week in office with Dr. for incision check and x-ray Contact information: Dr. Everardo Pacific, Ramond Marrow PA-C Dispo: TBD. Will contact patient's primary care provider per patient request to discuss his low Hgb levels. May require a transfusion. Patient agrees to transfusion if recommended by his PCP, otherwise he declines at this time. Vital signs are  stable at this time.   Alfonse Alpers, PA-C 03/19/2020

## 2020-03-19 NOTE — Plan of Care (Signed)
Plan of care reviewed and discussed with the patient. 

## 2020-03-19 NOTE — Progress Notes (Signed)
CRITICAL VALUE ALERT  Critical Value:  Hgb: 5.7  Date & Time Notied:  03/19/2020 @ 0404  Provider Notified: paged 03/19/2020 @ 0409  Orders Received/Actions taken: Orders rec'd for re-draw to verify result and to transfuse if Hgb <7 per Ulyses Southward, PA-C.

## 2020-03-20 ENCOUNTER — Encounter: Payer: Self-pay | Admitting: *Deleted

## 2020-03-20 DIAGNOSIS — Z09 Encounter for follow-up examination after completed treatment for conditions other than malignant neoplasm: Secondary | ICD-10-CM

## 2020-03-20 LAB — CBC
HCT: 26.3 % — ABNORMAL LOW (ref 39.0–52.0)
Hemoglobin: 8.2 g/dL — ABNORMAL LOW (ref 13.0–17.0)
MCH: 21 pg — ABNORMAL LOW (ref 26.0–34.0)
MCHC: 31.2 g/dL (ref 30.0–36.0)
MCV: 67.4 fL — ABNORMAL LOW (ref 80.0–100.0)
Platelets: 140 10*3/uL — ABNORMAL LOW (ref 150–400)
RBC: 3.9 MIL/uL — ABNORMAL LOW (ref 4.22–5.81)
RDW: 28 % — ABNORMAL HIGH (ref 11.5–15.5)
WBC: 6.9 10*3/uL (ref 4.0–10.5)
nRBC: 0 % (ref 0.0–0.2)

## 2020-03-20 LAB — BASIC METABOLIC PANEL WITH GFR
Anion gap: 9 (ref 5–15)
BUN: 14 mg/dL (ref 6–20)
CO2: 25 mmol/L (ref 22–32)
Calcium: 8.8 mg/dL — ABNORMAL LOW (ref 8.9–10.3)
Chloride: 107 mmol/L (ref 98–111)
Creatinine, Ser: 0.86 mg/dL (ref 0.61–1.24)
GFR calc Af Amer: >60 mL/min
GFR calc non Af Amer: >60 mL/min
Glucose, Bld: 105 mg/dL — ABNORMAL HIGH (ref 70–99)
Potassium: 4.1 mmol/L (ref 3.5–5.1)
Sodium: 141 mmol/L (ref 135–145)

## 2020-03-20 LAB — TYPE AND SCREEN
ABO/RH(D): O POS
Antibody Screen: POSITIVE
Unit division: 0
Unit division: 0

## 2020-03-20 LAB — BPAM RBC
Blood Product Expiration Date: 202105252359
Blood Product Expiration Date: 202105262359
ISSUE DATE / TIME: 202104221112
ISSUE DATE / TIME: 202104221538
Unit Type and Rh: 5100
Unit Type and Rh: 5100

## 2020-03-20 MED ORDER — METHOCARBAMOL 500 MG PO TABS: 500.0000 mg | ORAL_TABLET | Freq: Three times a day (TID) | ORAL | 0 refills | Status: DC | PRN

## 2020-03-20 MED ORDER — CELECOXIB 100 MG PO CAPS
100.0000 mg | ORAL_CAPSULE | Freq: Two times a day (BID) | ORAL | 0 refills | Status: AC
Start: 2020-03-20 — End: 2020-04-19

## 2020-03-20 MED ORDER — ONDANSETRON HCL 4 MG PO TABS: 4.0000 mg | ORAL_TABLET | Freq: Three times a day (TID) | ORAL | 1 refills | Status: AC | PRN

## 2020-03-20 MED ORDER — OXYCODONE HCL 5 MG PO TABS: ORAL_TABLET | ORAL | 0 refills | Status: AC

## 2020-03-20 MED ORDER — ACETAMINOPHEN 500 MG PO TABS
1000.0000 mg | ORAL_TABLET | Freq: Three times a day (TID) | ORAL | 0 refills | Status: AC
Start: 2020-03-20 — End: 2020-04-03

## 2020-03-20 MED ORDER — ENOXAPARIN SODIUM 40 MG/0.4ML ~~LOC~~ SOLN
40.0000 mg | SUBCUTANEOUS | 0 refills | Status: DC
Start: 2020-03-20 — End: 2021-08-18

## 2020-03-20 NOTE — Progress Notes (Signed)
[facility]  Subjective: [name], a 49 year old male with a medical history significant for sickle cell disease type [place], avascular necrosis of left femoral head, history of gastritis, history of osteoarthritis, history of tobacco dependence, and history of anemia of chronic disease presented for surgical procedure on 03/18/2020.  Patient underwent a total left shoulder arthroplasty.  Patient says that he feels well and is without complaint on today.  He endorses some soreness to left shoulder.  He states that allover joint pain has dissipated. On yesterday, patient's hemoglobin was decreased to 5.6.  He received 2 units of packed red blood cells without complication.  Hemoglobin returned to baseline at 8.2.  Patient is afebrile.  Oxygen saturation is 98% on RA.  He denies headache, dizziness, paresthesias, chest pain, shortness of breath, urinary symptoms, nausea, vomiting, or diarrhea.  Objective:  Vital signs in last 24 hours:  Vitals:   03/20/20 0131 03/20/20 0440 03/20/20 0546 03/20/20 1022  BP: 118/65  117/72 140/86  Pulse: 96  98 (!) 101  Resp: 17 16 17 16   Temp: 99.5 F (37.5 C)  99.3 F (37.4 C) 98.4 F (36.9 C)  TempSrc: Oral  Oral Oral  SpO2: 96% 97% 98% 100%  Weight:      Height:        Intake/Output from previous day:   Intake/Output Summary (Last 24 hours) at 03/20/2020 1158 Last data filed at 03/20/2020 0900 Gross per 24 hour  Intake 1583.56 ml  Output 2225 ml  Net -641.44 ml    Physical Exam: General: Alert, awake, oriented x3, in no acute distress.  HEENT: [place]/AT PEERL, EOMI Neck: Trachea midline,  no masses, no thyromegal,y no JVD, no carotid bruit OROPHARYNX:  Moist, No exudate/ erythema/lesions.  Heart: Regular rate and rhythm, without murmurs, rubs, gallops, PMI non-displaced, no heaves or thrills on palpation.  Lungs: Clear to auscultation, no wheezing or rhonchi noted. No increased vocal  fremitus resonant to percussion  Abdomen: Soft, nontender, nondistended, positive bowel sounds, no masses no hepatosplenomegaly noted..  Neuro: No focal neurological deficits noted cranial nerves II through XII grossly intact. DTRs 2+ bilaterally upper and lower extremities. Strength 5 out of 5 in bilateral upper and lower extremities. Musculoskeletal: No warm swelling or erythema around joints, no spinal tenderness noted. Psychiatric: Patient alert and oriented x3, good insight and cognition, good recent to remote recall. Lymph node survey: No cervical axillary or inguinal lymphadenopathy noted.  Lab Results:  Basic Metabolic Panel:    Component Value Date/Time   NA 141 03/20/2020 0719   NA 140 06/21/2012 0000   K 4.1 03/20/2020 0719   CL 107 03/20/2020 0719   CO2 25 03/20/2020 0719   BUN 14 03/20/2020 0719   BUN 12 06/21/2012 0000   CREATININE 0.86 03/20/2020 0719   GLUCOSE 105 (H) 03/20/2020 0719   CALCIUM 8.8 (L) 03/20/2020 0719   CBC:    Component Value Date/Time   WBC 6.9 03/20/2020 0719   HGB 8.2 (L) 03/20/2020 0719   HCT 26.3 (L) 03/20/2020 0719   PLT 140 (L) 03/20/2020 0719   MCV 67.4 (L) 03/20/2020 0719   NEUTROABS 6.3 03/19/2020 2132   LYMPHSABS 1.1 03/19/2020 2132   MONOABS 0.5 03/19/2020 2132   EOSABS 0.0 03/19/2020 2132   BASOSABS 0.0 03/19/2020 2132    Recent Results (from the past 240 hour(s))  Surgical pcr screen     Status: None   Collection Time: 03/10/20  1:42 PM   Specimen: Nasal Mucosa; Nasal Swab  Result Value Ref Range Status   MRSA, PCR NEGATIVE NEGATIVE Final   Staphylococcus aureus NEGATIVE NEGATIVE Final    Comment: (NOTE) The Xpert SA Assay (FDA approved for NASAL specimens in patients 73 years of age and older), is one component of a comprehensive surveillance program. It is not intended to diagnose infection nor to guide or monitor treatment. Performed at [facility], [address], [place], [place] [address]     SARS CORONAVIRUS 2 (TAT 6-24 HRS) Nasopharyngeal Nasopharyngeal Swab     Status: None   Collection Time: 03/14/20  1:48 PM   Specimen: Nasopharyngeal Swab  Result Value Ref Range Status   SARS Coronavirus 2 NEGATIVE NEGATIVE Final    Comment: (NOTE) SARS-CoV-2 target nucleic acids are NOT DETECTED. The SARS-CoV-2 RNA is generally detectable in upper and lower respiratory specimens during the acute phase of infection. Negative results do not preclude SARS-CoV-2 infection, do not rule out co-infections with other pathogens, and should not be used as the sole basis for treatment or other patient management decisions. Negative results must be combined with clinical observations, patient history, and epidemiological information. The expected result is Negative. Fact Sheet for Patients: HairSlick.no Fact Sheet for Healthcare Providers: quierodirigir.com This test is not yet approved or cleared by the [place] FDA and  has been authorized for detection and/or diagnosis of SARS-CoV-2 by FDA under an Emergency Use Authorization (EUA). This EUA will remain  in effect (meaning this test can be used) for the duration of the COVID-19 declaration under Section 56 4(b)(1) of the Act, 21 U.S.C. section 360bbb-3(b)(1), unless the authorization is terminated or revoked sooner. Performed at [facility], [address], [place], [place] [address]     Studies/Results: DG Shoulder Left Port  Result Date: 03/18/2020 CLINICAL DATA:  Left shoulder replacement EXAM: LEFT SHOULDER COMPARISON:  01/24/2020 FINDINGS: Single frontal view of the left shoulder demonstrates total left shoulder arthroplasty in the expected position without signs of acute complication. Postsurgical changes are seen in the soft tissues. Left chest is clear. IMPRESSION: 1. Unremarkable left shoulder arthroplasty. Electronically Signed   By: Sharlet Salina M.D.   On:  03/18/2020 16:00    Medications: Scheduled Meds: . sodium chloride   Intravenous Once  . docusate sodium  100 mg Oral BID  . enoxaparin (LOVENOX) injection  40 mg Subcutaneous Q24H  . ketorolac  15 mg Intravenous Q6H   Continuous Infusions: . lactated ringers 20 mL/hr at 03/19/20 1500  . 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 Patient sats that joint pain has dissipated.  Pain intensity is 3/10 and is localized to left shoulder. Patient is status post 2 units PRBCs without complication.  Hemoglobin is 8.2, which is consistent with his baseline. From sickle cell management standpoint, patient is cleared for discharge. He will follow-up with [facility] patient care center/sickle cell center on 03/24/2020 for CBC with differential.  Also, patient will follow-up with Dr. Willey Blade in 2 weeks for sickle cell pain medication management.  Patient to continue folic acid 1 mg  daily.  Also discussed the importance of remaining hydrated.  Patient is scheduled to follow-up with orthopedic specialist on 03/24/2020.  Patient is alert, oriented, and ambulating.  He is currently hemodynamically stable.   Code Status: Full Code Family Communication: N/A Disposition Plan: Not yet ready for discharge  Lavergne Hiltunen Al Decant  APRN, MSN, FNP-C [facility] cell [facility] [place], [place] [address] [phone]  If 5PM-8AM, please contact night-coverage.  03/20/2020, 11:58 AM  LOS: 1 day

## 2020-03-20 NOTE — Discharge Instructions (Signed)
Resume home pain medication regimen. Follow up with your PCP in 2 weeks.  You will follow up at [facility]/sickle cell center to repeat labs on Tuesday. Discussed the importance of drinking 64 ounces of water daily. To help prevent pain crises, it is important to drink plenty of water throughout the day. This is because dehydration of red blood cells may lead to sickling process.   [facility] [address] [place], [place]  [phone]

## 2020-03-20 NOTE — Evaluation (Signed)
Occupational Therapy Evaluation Patient Details Name: [name] MRN: [id number] DOB: 04/24/1971 Today's Date: 03/20/2020    History of Present Illness Patient is a 49 year old man s/p left reverse total shoulder arthroplasty on 03/19/2020. Patient held yesterday for occupational therapy due to sickle cell pain crisis.   Clinical Impression   [name] is POD 2 after left reverse total shoulder and presents with minimal pain and eager to participate in therapy. Patient demonstrates deficits of ROM and strength of left upper extremity and limited by shoulder precautions. Therapist provided education and instruction to patient and spouse in regards to exercises, precautions, positioning, donning upper extremity clothing and bathing while maintaining shoulder precautions, ice and edema management and donning/doffing sling. Patient and spouse verbalized understanding and demonstrated as needed. Patient to follow up with MD for further therapy needs.     Follow Up Recommendations  Follow surgeon's recommendation for DC plan and follow-up therapies    Equipment Recommendations  Other (comment)(shower chair)    Recommendations for Other Services       Precautions / Restrictions Precautions Precautions: Shoulder Shoulder Interventions: Shoulder sling/immobilizer;At all times;Off for dressing/bathing/exercises;Shoulder abduction pillow Precaution Booklet Issued: No Precaution Comments: NWB Required Braces or Orthoses: Sling Restrictions Weight Bearing Restrictions: Yes LUE Weight Bearing: Non weight bearing Other Position/Activity Restrictions: AROM of elbow, wrist and had tolerance, No shoulder PROM, no AROM of shoulder      Mobility Bed Mobility               General bed mobility comments: Education in regards to bed mobiity and positioning. Patient sleeps on right side of bed and can roll to the right.  Transfers                      Balance Overall  balance assessment: No apparent balance deficits (not formally assessed)                                         ADL either performed or assessed with clinical judgement   ADL Overall ADL's : Needs assistance/impaired Eating/Feeding: Set up   Grooming: Set up   Upper Body Bathing: Minimal assistance Upper Body Bathing Details (indicate cue type and reason): Min assist needed for RUE and under left arm. Spouse assisted with sponge bathing last night. Spouse educated on technique Lower Body Bathing: Minimal assistance   Upper Body Dressing : Maximal assistance Upper Body Dressing Details (indicate cue type and reason): max assist to donn/doff sling and donn undershirt and t-shirt. Patient limted by pain and shoulder precautions. Lower Body Dressing: Moderate assistance Lower Body Dressing Details (indicate cue type and reason): Spouse assisted patient with donning boxers, pants. Patietn able to slide into shoes.   Toilet Transfer Details (indicate cue type and reason): Discussed toilet transfer safety and not using LUE to assit with push up or pulling. reports toilet not hard to stand from. Toileting- Clothing Manipulation and Hygiene: Independent Toileting - Clothing Manipulation Details (indicate cue type and reason): Discussed types of clothing to wear to improve ease of clothing manipulation with toiletiong. Tub/ Shower Transfer: Education officer, environmental Details (indicate cue type and reason): Educated patient on showering. recommended shower chair. Functional mobility during ADLs: Caregiver able to provide necessary level of assistance       Vision         Perception  Praxis      Pertinent Vitals/Pain Pain Assessment: Faces Faces Pain Scale: Hurts a little bit Pain Location: left shoulder Pain Descriptors / Indicators: Sore;Grimacing Pain Intervention(s): Monitored during session;Premedicated before session     Hand Dominance Right    Extremity/Trunk Assessment Upper Extremity Assessment Upper Extremity Assessment: LUE deficits/detail LUE Deficits / Details: limited due to shoulder precautions. decreased forearm supination to approx 1/2 ROM secondary to pain.       Cervical / Trunk Assessment Cervical / Trunk Assessment: Normal   Communication Communication Communication: No difficulties   Cognition Arousal/Alertness: Awake/alert Behavior During Therapy: WFL for tasks assessed/performed Overall Cognitive Status: Within Functional Limits for tasks assessed                                     General Comments  Spouse will be present during recovery to assist as needed. No LOB or deficits noted.    Exercises Shoulder Exercises Elbow Flexion: AROM;5 reps Elbow Extension: AROM;5 reps Wrist Flexion: AROM;5 reps Wrist Extension: AROM;5 reps Digit Composite Flexion: AROM;5 reps Composite Extension: AROM;5 reps   Shoulder Instructions Shoulder Instructions Donning/doffing shirt without moving shoulder: Caregiver independent with task Method for sponge bathing under operated UE: Caregiver independent with task Donning/doffing sling/immobilizer: Caregiver independent with task Correct positioning of sling/immobilizer: Caregiver independent with task ROM for elbow, wrist and digits of operated UE: Patient able to independently direct caregiver;Caregiver independent with task Sling wearing schedule (on at all times/off for ADL's): Patient able to independently direct caregiver;Caregiver independent with task Proper positioning of operated UE when showering: Patient able to independently direct caregiver;Caregiver independent with task Dressing change: Caregiver independent with task;Patient able to independently direct caregiver Positioning of UE while sleeping: Caregiver independent with task;Patient able to independently direct caregiver    Home Living Family/patient expects to be discharged to::  Private residence Living Arrangements: Spouse/significant other Available Help at Discharge: Family Type of Home: House       Home Layout: Two level Alternate Level Stairs-Number of Steps: 13 Alternate Level Stairs-Rails: Right Bathroom Shower/Tub: Occupational psychologist: Standard Bathroom Accessibility: Yes How Accessible: Accessible via walker Home Equipment: Walker - 2 wheels          Prior Functioning/Environment Level of Independence: Independent        Comments: works with machinery        OT Problem List: Decreased strength;Decreased range of motion;Pain;Impaired UE functional use      OT Treatment/Interventions:      OT Goals(Current goals can be found in the care plan section) Acute Rehab OT Goals Patient Stated Goal: To go home OT Goal Formulation: With patient/family Potential to Achieve Goals: Good  OT Frequency:     Barriers to D/C:            Co-evaluation              AM-PAC OT "6 Clicks" Daily Activity     Outcome Measure Help from another person eating meals?: A Little Help from another person taking care of personal grooming?: A Little Help from another person toileting, which includes using toliet, bedpan, or urinal?: A Little Help from another person bathing (including washing, rinsing, drying)?: A Little Help from another person to put on and taking off regular upper body clothing?: A Lot Help from another person to put on and taking off regular lower body clothing?: A Lot 6 Click  Score: 16   End of Session Equipment Utilized During Treatment: Other (comment)  Activity Tolerance: Patient tolerated treatment well Patient left: in chair;with family/visitor present  OT Visit Diagnosis: Pain;Muscle weakness (generalized) (M62.81) Pain - Right/Left: Left Pain - part of body: Shoulder                Time: 2536-6440 OT Time Calculation (min): 29 min Charges:  OT General Charges $OT Visit: 1 Visit OT Evaluation $OT  Eval Moderate Complexity: 1 Mod OT Treatments $Self Care/Home Management : 8-22 mins  Waldron Session, OTR/L Acute Care Rehab Services  Office [phone]   Kelli Churn 03/20/2020, 10:35 AM

## 2020-03-20 NOTE — Discharge Summary (Signed)
Patient ID: [name] MRN: [id number] DOB/AGE: 1971-07-23 49 y.o.  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 Avascular necrosis of left femoral head (HCC)   . Erectile dysfunction   . Gastritis and duodenitis   . GERD (gastroesophageal reflux disease)   . Low testosterone   . Mental disorder   . Mixed restrictive and obstructive lung disease (HCC)   . Osteoarthrosis, unspecified whether generalized or localized, unspecified site   . Pain in joint, site unspecified   . Physical exam, annual 02/18/2010  . Sickle cell anemia (HCC)   . Tobacco use     Procedures Performed: Left reverse Total Shoulder Arthroplasty  Discharged Condition: stable  Hospital Course: Patient brought in to [facility] for surgery on 03/18/2020. He tolerated the procedure well. Due to his history of sickle cell anemia, patient was admitted to the hospital for observation following surgery. His hemoglobin levels dropped to 5.4 during his stay. He received a blood transfusion. Following this patient had extreme pain consistent with sickle cell pain crisis. The sickle cell inpatient team was consulted. He was kept for monitoring an additional night stay for pain control and monitoring of hemoglobin levels. On 4/23, patient's pain was well controlled without PCA and his hemoglobin was stable at his baseline. Patient was found to discharge home. Patient and his family felt comfortable with discharge home. He has follow-up appointments scheduled with Dr. August Saucer at the sickle cell center and with Dr. Everardo Pacific for post-op care. Patient was instructed on specific activity restrictions and all questions were answered.  Consults:  Occupational therapy, Inpatient Sickle Cell Team  Treatments: Surgery  Discharge Exam: Left upper extremity: Dressing CDI and sling well fitting,  full and painless ROM throughout hand with DPC of 0. + Motor in  AIN, PIN, Ulnar distributions. Axillary nerve sensation preserved and symmetric.  Sensation intact in medial, radial, and ulnar distributions. Well perfused digits.   Disposition: Discharge disposition: 01-Home or Self Care       Discharge Instructions    Call MD for:  redness, tenderness, or signs of infection (pain, swelling, redness, odor or green/yellow discharge around incision site)   Complete by: As directed    Call MD for:  severe uncontrolled pain   Complete by: As directed    Call MD for:  temperature >100.4   Complete by: As directed    Diet - low sodium heart healthy   Complete by: As directed    Discharge instructions   Complete by: As directed    Ramond Marrow MD, MPH Alfonse Alpers, PA-C [facility] [address] [phone] (tel)   [phone] (fax)   POST-OPERATIVE INSTRUCTIONS - TOTAL SHOULDER REPLACEMENT    WOUND CARE - You may leave the operative dressing in place until your follow-up appointment. - KEEP THE INCISIONS CLEAN AND DRY. - There may be a small amount of fluid/bleeding leaking at the surgical site. This is normal after surgery.  - If it fills with liquid or blood please call us immediately to change it for you. - Use the provided ice machine or Ice packs as often as possible for the first 3-4 days, then as needed for pain relief.  Keep a layer of cloth or a shirt between your skin and the cooling unit to prevent frost bite as it can get very cold.  SHOWERING: - You may shower on Post-Op Day #2.  - The dressing is water resistant but do not scrub it as it may start to peel up.   - You may remove the sling for showering, but keep a water resistant pillow under the arm to keep both the  elbow and  shoulder away from the body (mimicking the abduction sling).  - Gently pat the area dry.  - Do not soak the shoulder in water. Do not go swimming in the pool or ocean until your sutures are removed. - KEEP THE INCISIONS CLEAN AND DRY.  EXERCISES - Wear the sling at all times except when doing your exercises.  - You may remove the sling for showering, but keep the arm across the chest or in a secondary sling.    - Accidental/Purposeful External Rotation and shoulder flexion (reaching behind you) is to be avoided at all costs for the first month. - It is ok to come out of your sling if your are sitting and have assistance for eating.  Do not lift anything heavier than 1 pound until we discuss it further in clinic.  Please perform the exercises:   Elbow / Hand / Wrist  Range of Motion Exercises Grip strengthening   REGIONAL ANESTHESIA (NERVE BLOCKS) The anesthesia team may have performed a nerve block for you if safe in the setting of your care.  This is a great tool used to minimize pain.  Typically the block may start wearing off overnight but the long acting medicine may last for 3-4 days.  The nerve block wearing off can be a challenging period but please utilize your as needed pain medications to try and manage this period.    POST-OP MEDICATIONS- Multimodal approach to pain control In general your pain will be controlled with a combination of substances.  Prescriptions unless otherwise discussed are electronically sent to your pharmacy.  This is a carefully made plan we use to minimize narcotic use.    Meloxicam OR Celebrex - Anti-inflammatory medication taken on a scheduled basis Acetaminophen - Non-narcotic pain medicine taken on a scheduled basis  Oxycodone - This is a strong narcotic, to be used only on an "as needed" basis for pain. Zofran -  take as needed for nausea Robaxin - this is a muscle relaxer, you may use as needed for muscle spasms Lovenox - this is a medication you will  take for 4 weeks to help prevent blood clots after surgery  Meloxicam/Celebrex - these are anti-inflammatory and pain relievers.  Do not take additional ibuprofen, naproxen or other NSAID while taking this medicine.   FOLLOW-UP If you develop a Fever (>101.5), Redness or Drainage from the surgical incision site, please call our office to arrange for an evaluation. Please call the office to schedule a follow-up appointment for a wound check, 7-10 days post-operatively.  IF YOU HAVE ANY QUESTIONS, PLEASE FEEL FREE TO CALL OUR OFFICE.  HELPFUL INFORMATION  If you had a block, it will wear off between 8-24 hrs postop typically.  This is period when your pain may go from nearly zero to the pain you would have had post-op without the block.  This is an abrupt transition but nothing dangerous is happening.  You may take an extra dose of narcotic when this happens.  Your arm will be in a  sling following surgery. You will be in this sling for the next 3-4 weeks.  I will let you know the exact duration at your follow-up visit.  You may be more comfortable sleeping in a semi-seated position the first few nights following surgery.  Keep a pillow propped under the elbow and forearm for comfort.  If you have a recliner type of chair it might be beneficial.  If not that is fine too, but it would be helpful to sleep propped up with pillows behind your operated shoulder as well under your elbow and forearm.  This will reduce pulling on the suture lines.  When dressing, put your operative arm in the sleeve first.  When getting undressed, take your operative arm out last.  Loose fitting, button-down shirts are recommended.  In most states it is against the law to drive while your arm is in a sling. And certainly against the law to drive while taking narcotics.  You may return to work/school in the next couple of days when you feel up to it. Desk work and typing in the sling is fine.  We suggest you use the pain  medication the first night prior to going to bed, in order to ease any pain when the anesthesia wears off. You should avoid taking pain medications on an empty stomach as it will make you nauseous.  Do not drink alcoholic beverages or take illicit drugs when taking pain medications.  Pain medication may make you constipated.  Below are a few solutions to try in this order: Decrease the amount of pain medication if you aren't having pain. Drink lots of decaffeinated fluids. Drink prune juice and/or each dried prunes  If the first 3 don't work start with additional solutions Take Colace - an over-the-counter stool softener Take Senokot - an over-the-counter laxative Take Miralax - a stronger over-the-counter laxative   Dental Antibiotics:  In most cases prophylactic antibiotics for Dental procdeures after total joint surgery are not necessary.  Exceptions are as follows:  1. History of prior total joint infection  2. Severely immunocompromised (Organ Transplant, cancer chemotherapy, Rheumatoid biologic meds such as Humera)  3. Poorly controlled diabetes (A1C > 8.0, blood glucose over 200)  If you have one of these conditions, contact your surgeon for an antibiotic prescription, prior to your dental procedure.     Allergies as of 03/20/2020   No Known Allergies     Medication List    STOP taking these medications   ibuprofen 800 MG tablet Commonly known as: ADVIL     TAKE these medications   acetaminophen 500 MG tablet Commonly known as: TYLENOL Take 2 tablets (1,000 mg total) by mouth every 8 (eight) hours for 14 days. What changed:   how much to take  when to take this  reasons to take this   celecoxib 100 MG capsule Commonly known as: CeleBREX Take 1 capsule (100 mg total) by mouth 2 (two) times daily.   enoxaparin 40 MG/0.4ML injection Commonly known as: LOVENOX Inject 0.4 mLs (40 mg total) into the skin daily.   ferrous sulfate 325 (65 FE) MG  tablet Take 325 mg by mouth daily with breakfast.   Fish Oil 1000 MG Caps Take 1,000 mg by mouth daily.   folic acid 400 MCG tablet Commonly known as: FOLVITE Take 400 mcg by mouth daily.   methocarbamol 500 MG tablet Commonly known as: Robaxin Take 1 tablet (500 mg total) by mouth every 8 (eight) hours as needed for  muscle spasms.   ondansetron 4 MG tablet Commonly known as: Zofran Take 1 tablet (4 mg total) by mouth every 8 (eight) hours as needed for up to 7 days for nausea or vomiting.   oxyCODONE 5 MG immediate release tablet Commonly known as: Oxy IR/ROXICODONE Take 1-2 pills every 6 hrs as needed for pain, no more than 6 per day   oxyCODONE-acetaminophen 10-325 MG tablet Commonly known as: PERCOCET Take 1 tablet by mouth every 6 (six) hours as needed for pain.   Vitamin D3 250 MCG (10000 UT) Tabs Take 10,000 Units by mouth daily.   Zinc 50 MG Tabs Take 50 mg by mouth daily.      Follow-up Information    Gwenyth Bender, MD Follow up in 2 week(s).   Specialty: Internal Medicine Contact information: [address] [place] [address] [phone]        Massie Maroon, FNP Follow up on 03/24/2020.   Specialty: Family Medicine Why: Will repeat labs on Tuesday, March 24, 2020 per Dr. August Saucer.  Contact information: [address] [place] [address] [phone]

## 2020-03-20 NOTE — Progress Notes (Signed)
   ORTHOPAEDIC PROGRESS NOTE  s/p Procedure(s): TOTAL SHOULDER ARTHROPLASTY  SUBJECTIVE: Reports mild pain about operative site. Sickle cell crisis appears to be under control.  Patient wants to go home today.    OBJECTIVE: PE:  Dressing CDI and sling well fitting,  full and painless ROM throughout hand with DPC of 0. + Motor in  AIN, PIN, Ulnar distributions. Axillary nerve sensation preserved and symmetric.  Sensation intact in medial, radial, and ulnar distributions. Well perfused digits.     Vitals:   03/20/20 0440 03/20/20 0546  BP:  117/72  Pulse:  98  Resp: 16 17  Temp:  99.3 F (37.4 C)  SpO2: 97% 98%     ASSESSMENT: [name] is a 49 y.o. male doing well postoperatively.  -dc pca -appreciate recs from sickle cell team -if pain controlled with orals primarily today, plan for dc this afternoon.  PLAN: Weightbearing: NWB LUE Insicional and dressing care: Dressings left intact until follow-up Orthopedic device(s): sling Showering: PRN VTE prophylaxis: Lovenox 40mg  qd until 4 weeks postop Pain control: prn meds, dc PCA, normalize for dc today Follow - up plan: Tuesday with myself Contact information:  Weekdays 8-5 08-01-2006 PA-C [phone], After hours and holidays please check [email or web address] for group call information for [facility]

## 2020-03-21 LAB — POCT I-STAT, CHEM 8
BUN: 11 mg/dL (ref 6–20)
Calcium, Ion: 1.26 mmol/L (ref 1.15–1.40)
Chloride: 107 mmol/L (ref 98–111)
Creatinine, Ser: 1 mg/dL (ref 0.61–1.24)
Glucose, Bld: 112 mg/dL — ABNORMAL HIGH (ref 70–99)
HCT: 31 % — ABNORMAL LOW (ref 39.0–52.0)
Hemoglobin: 10.5 g/dL — ABNORMAL LOW (ref 13.0–17.0)
Potassium: 5.4 mmol/L — ABNORMAL HIGH (ref 3.5–5.1)
Sodium: 139 mmol/L (ref 135–145)
TCO2: 25 mmol/L (ref 22–32)

## 2020-03-24 ENCOUNTER — Other Ambulatory Visit: Payer: Self-pay | Admitting: Family Medicine

## 2020-03-24 ENCOUNTER — Non-Acute Institutional Stay (HOSPITAL_COMMUNITY)
Admission: RE | Admit: 2020-03-24 | Discharge: 2020-03-24 | Disposition: A | Discharge status: Home / Self Care | Payer: BC Managed Care – PPO | Source: Ambulatory Visit | Attending: Internal Medicine | Admitting: Internal Medicine

## 2020-03-24 ENCOUNTER — Other Ambulatory Visit: Payer: Self-pay

## 2020-03-24 DIAGNOSIS — D571 Sickle-cell disease without crisis: Secondary | ICD-10-CM | POA: Insufficient documentation

## 2020-03-24 LAB — CBC WITH DIFFERENTIAL/PLATELET
Abs Immature Granulocytes: 0.02 10*3/uL (ref 0.00–0.07)
Basophils Absolute: 0 10*3/uL (ref 0.0–0.1)
Basophils Relative: 0 %
Eosinophils Absolute: 0 10*3/uL (ref 0.0–0.5)
Eosinophils Relative: 1 %
HCT: 36.2 % — ABNORMAL LOW (ref 39.0–52.0)
Hemoglobin: 10.9 g/dL — ABNORMAL LOW (ref 13.0–17.0)
Immature Granulocytes: 0 %
Lymphocytes Relative: 10 %
Lymphs Abs: 0.6 10*3/uL — ABNORMAL LOW (ref 0.7–4.0)
MCH: 21.4 pg — ABNORMAL LOW (ref 26.0–34.0)
MCHC: 30.1 g/dL (ref 30.0–36.0)
MCV: 71.1 fL — ABNORMAL LOW (ref 80.0–100.0)
Monocytes Absolute: 0.2 10*3/uL (ref 0.1–1.0)
Monocytes Relative: 4 %
Neutro Abs: 5.4 10*3/uL (ref 1.7–7.7)
Neutrophils Relative %: 85 %
Platelets: 198 10*3/uL (ref 150–400)
RBC: 5.09 MIL/uL (ref 4.22–5.81)
RDW: 29.5 % — ABNORMAL HIGH (ref 11.5–15.5)
WBC: 6.3 10*3/uL (ref 4.0–10.5)
nRBC: 0 % (ref 0.0–0.2)

## 2020-03-24 LAB — BASIC METABOLIC PANEL
Anion gap: 13 (ref 5–15)
BUN: 12 mg/dL (ref 6–20)
CO2: 24 mmol/L (ref 22–32)
Calcium: 9.8 mg/dL (ref 8.9–10.3)
Chloride: 101 mmol/L (ref 98–111)
Creatinine, Ser: 0.93 mg/dL (ref 0.61–1.24)
GFR calc Af Amer: >60 mL/min (ref >60)
GFR calc non Af Amer: >60 mL/min (ref >60)
Glucose, Bld: 172 mg/dL — ABNORMAL HIGH (ref 70–99)
Potassium: 3.9 mmol/L (ref 3.5–5.1)
Sodium: 138 mmol/L (ref 135–145)

## 2020-03-24 NOTE — Progress Notes (Signed)
PATIENT CARE CENTER NOTE  Diagnosis: Sickle Cell Anemia    Provider: Hollis, [place], FNP   Procedure: Lab draw   Note: Patient labs drawn (CBC w/diff and BMP). Patient tolerated well. Alert, oriented and ambulatory at discharge.

## 2020-03-25 ENCOUNTER — Ambulatory Visit (HOSPITAL_COMMUNITY): Payer: BC Managed Care – PPO

## 2020-03-26 ENCOUNTER — Telehealth: Payer: Self-pay

## 2020-03-26 NOTE — Telephone Encounter (Signed)
-----   Message from Massie Maroon, [place] sent at 03/25/2020  3:43 PM EDT ----- Regarding: lab results Please inform this patient that hemoglobin has improved and all other labs are within a normal range. He will need to schedule a follow up with Dr. Willey Blade.   Nolon Nations  APRN, MSN, FNP-C [facility] [address] [place], [place] [address] [phone]

## 2020-03-26 NOTE — Telephone Encounter (Signed)
Called advise patient of his lab results, and inform him for follow up with Dr.Dean.

## 2020-04-09 ENCOUNTER — Inpatient Hospital Stay (HOSPITAL_COMMUNITY)
Admission: EM | Admit: 2020-04-09 | Discharge: 2020-04-12 | DRG: 812 | Disposition: A | Discharge status: Home / Self Care | Payer: BC Managed Care – PPO | Attending: Internal Medicine | Admitting: Internal Medicine

## 2020-04-09 ENCOUNTER — Emergency Department (HOSPITAL_COMMUNITY): Payer: BC Managed Care – PPO

## 2020-04-09 ENCOUNTER — Encounter (HOSPITAL_COMMUNITY): Payer: Self-pay | Admitting: Emergency Medicine

## 2020-04-09 ENCOUNTER — Other Ambulatory Visit: Payer: Self-pay

## 2020-04-09 DIAGNOSIS — D5700 Hb-SS disease with crisis, unspecified: Secondary | ICD-10-CM | POA: Diagnosis present

## 2020-04-09 DIAGNOSIS — R509 Fever, unspecified: Secondary | ICD-10-CM | POA: Diagnosis not present

## 2020-04-09 DIAGNOSIS — Z96612 Presence of left artificial shoulder joint: Secondary | ICD-10-CM | POA: Diagnosis present

## 2020-04-09 DIAGNOSIS — R112 Nausea with vomiting, unspecified: Secondary | ICD-10-CM

## 2020-04-09 DIAGNOSIS — D57219 Sickle-cell/Hb-C disease with crisis, unspecified: Principal | ICD-10-CM | POA: Diagnosis present

## 2020-04-09 DIAGNOSIS — K8020 Calculus of gallbladder without cholecystitis without obstruction: Secondary | ICD-10-CM | POA: Diagnosis present

## 2020-04-09 DIAGNOSIS — Z09 Encounter for follow-up examination after completed treatment for conditions other than malignant neoplasm: Secondary | ICD-10-CM

## 2020-04-09 DIAGNOSIS — R1110 Vomiting, unspecified: Secondary | ICD-10-CM

## 2020-04-09 DIAGNOSIS — D638 Anemia in other chronic diseases classified elsewhere: Secondary | ICD-10-CM | POA: Diagnosis present

## 2020-04-09 DIAGNOSIS — G894 Chronic pain syndrome: Secondary | ICD-10-CM | POA: Diagnosis present

## 2020-04-09 DIAGNOSIS — Z87891 Personal history of nicotine dependence: Secondary | ICD-10-CM

## 2020-04-09 DIAGNOSIS — D571 Sickle-cell disease without crisis: Secondary | ICD-10-CM

## 2020-04-09 DIAGNOSIS — J9811 Atelectasis: Secondary | ICD-10-CM | POA: Diagnosis present

## 2020-04-09 DIAGNOSIS — Z79899 Other long term (current) drug therapy: Secondary | ICD-10-CM

## 2020-04-09 DIAGNOSIS — I2721 Secondary pulmonary arterial hypertension: Secondary | ICD-10-CM | POA: Diagnosis present

## 2020-04-09 DIAGNOSIS — R1084 Generalized abdominal pain: Secondary | ICD-10-CM

## 2020-04-09 DIAGNOSIS — K219 Gastro-esophageal reflux disease without esophagitis: Secondary | ICD-10-CM | POA: Diagnosis present

## 2020-04-09 DIAGNOSIS — Z20822 Contact with and (suspected) exposure to covid-19: Secondary | ICD-10-CM | POA: Diagnosis present

## 2020-04-09 DIAGNOSIS — D5709 Hb-ss disease with crisis with other specified complication: Secondary | ICD-10-CM

## 2020-04-09 MED ORDER — KETOROLAC TROMETHAMINE 15 MG/ML IJ SOLN
15.0000 mg | INTRAMUSCULAR | Status: AC
  Administered 2020-04-10: 15 mg via INTRAVENOUS
  Filled 2020-04-09: qty 1

## 2020-04-09 MED ORDER — SODIUM CHLORIDE 0.45 % IV SOLN: INTRAVENOUS | Status: DC

## 2020-04-09 MED ORDER — HYDROMORPHONE HCL 2 MG/ML IJ SOLN
2.0000 mg | INTRAMUSCULAR | Status: AC
  Administered 2020-04-10: 2 mg via INTRAVENOUS
  Filled 2020-04-09: qty 1

## 2020-04-09 MED ORDER — ACETAMINOPHEN 325 MG PO TABS
650.0000 mg | ORAL_TABLET | Freq: Once | ORAL | Status: AC
  Administered 2020-04-10: 650 mg via ORAL
  Filled 2020-04-09: qty 2

## 2020-04-09 MED ORDER — ONDANSETRON 8 MG PO TBDP
8.0000 mg | ORAL_TABLET | Freq: Once | ORAL | Status: AC
  Administered 2020-04-10: 8 mg via ORAL
  Filled 2020-04-09: qty 1

## 2020-04-09 MED ORDER — SODIUM CHLORIDE 0.9% FLUSH
3.0000 mL | Freq: Once | INTRAVENOUS | Status: AC
  Administered 2020-04-10: 3 mL via INTRAVENOUS

## 2020-04-09 NOTE — ED Triage Notes (Signed)
Patient believes his sickle cell is "flaring up" because he is having pain in his left chest which radiates to his left shoulder. He also complains of abdominal pain and vomiting the past 24 hrs post PO intake. He has had a recent surgery on the left arm 3 weeks ago.

## 2020-04-09 NOTE — ED Provider Notes (Signed)
[facility]-EMERGENCY DEPT Provider Note   CSN: [id number] Arrival date & time: 04/09/20  2109     History Chief Complaint  Patient presents with  . Chest Pain  . Shoulder Pain  . Nausea  . Emesis  . Abdominal Pain    [name] is a 49 y.o. male with PMHx sickle cell anemia, avascular necrosis of L femoral head, currently on Lovenox s/p L total shoulder arthroplasty (03/18/20) who presents to the ED today with complaints of gradual onset, constant, sharp, left sided chest/left shoulder pain x 1 day. Pt states he will occassionally have pain in his chest and shoulder from sickle cell however he did recently have surgery on his shoulder. Pt also complains of abdominal pain, nausea, and NBNB emesis. Has been unable to keep anything down for the past 24 hours. Pt unaware he had a fever at home however on arrival to the ED temperature 100.5. Pt denies any SOB. He has been vaccinated for COVID 19.   The history is provided by medical records and the patient.       Past Medical History:  Diagnosis Date  . Avascular necrosis of left femoral head (HCC)   . Erectile dysfunction   . Gastritis and duodenitis   . GERD (gastroesophageal reflux disease)   . Low testosterone   . Mental disorder   . Mixed restrictive and obstructive lung disease (HCC)   . Osteoarthrosis, unspecified whether generalized or localized, unspecified site   . Pain in joint, site unspecified   . Physical exam, annual 02/18/2010  . Sickle cell anemia (HCC)   . Tobacco use     Patient Active Problem List   Diagnosis Date Noted  . Postop check   . Sickle cell anemia with pain (HCC) 03/19/2020  . 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/18/2020  . Sickle cell anemia with crisis (HCC) 03/15/2016  . Thrombocytopenia (HCC) 01/02/2013  . Anemia 01/02/2013  . Hemoglobin [place] with crisis (HCC) 01/02/2013  . Epigastric abdominal pain 01/02/2013  . Fever 01/02/2013  . Abdominal pain,  left upper quadrant 01/02/2013  . Avascular necrosis of left femoral head (HCC)   . Erectile dysfunction   . Low testosterone   . Mixed restrictive and obstructive lung disease (HCC)   . Gastritis and duodenitis   . Impotence of organic origin   . Osteoarthrosis, unspecified whether generalized or localized, unspecified site   . Pain in joint, site unspecified     Past Surgical History:  Procedure Laterality Date  . ELECTROCARDIOGRAM  01/31/2011  . ESOPHAGOGASTRODUODENOSCOPY  01/03/2013   Procedure: ESOPHAGOGASTRODUODENOSCOPY (EGD);  Surgeon: Meryl Dare, MD,FACG;  Location: [facility];  Service: Endoscopy;  Laterality: N/A;  . TOTAL SHOULDER ARTHROPLASTY Left 03/18/2020   Procedure: TOTAL SHOULDER ARTHROPLASTY;  Surgeon: Bjorn Pippin, MD;  Location: [facility];  Service: Orthopedics;  Laterality: Left;  . UPPER GASTROINTESTINAL ENDOSCOPY  11/18/2010       History reviewed. No pertinent family history.  Social History   Tobacco Use  . Smoking status: Former Games developer  . Smokeless tobacco: Never Used  Substance Use Topics  . Alcohol use: Yes    Alcohol/week: 0.0 standard drinks    Comment: OCCASIONAL  . Drug use: No    Home Medications Prior to Admission medications   Medication Sig Start Date End Date Taking? Authorizing Provider  acetaminophen (TYLENOL) 500 MG tablet Take 1,000 mg by mouth every 4 (four) hours as needed for mild pain.   Yes  [provider]  Cholecalciferol (VITAMIN D3) 250 MCG (10000 UT) TABS Take 10,000 Units by mouth daily.   Yes [provider]  enoxaparin (LOVENOX) 40 MG/0.4ML injection Inject 0.4 mLs (40 mg total) into the skin daily. 03/20/20 04/19/20 Yes McBane, Jerald Kief, PA-C  ferrous sulfate 325 (65 FE) MG tablet Take 325 mg by mouth daily with breakfast.   Yes [provider]  folic acid (FOLVITE) 400 MCG tablet Take 400 mcg by mouth daily.   Yes [provider]  Omega-3 Fatty Acids (FISH OIL) 1000 MG CAPS Take  1,000 mg by mouth daily.   Yes [provider]  ondansetron (ZOFRAN) 4 MG tablet Take 4 mg by mouth every 8 (eight) hours as needed for nausea or vomiting.   Yes [provider]  oxyCODONE-acetaminophen (PERCOCET) 10-325 MG tablet Take 1 tablet by mouth every 6 (six) hours as needed for pain. 01/20/20  Yes [provider]  Zinc 50 MG TABS Take 50 mg by mouth daily.   Yes [provider]  celecoxib (CELEBREX) 100 MG capsule Take 1 capsule (100 mg total) by mouth 2 (two) times daily. 03/20/20 04/19/20  McBane, Jerald Kief, PA-C  methocarbamol (ROBAXIN) 500 MG tablet Take 1 tablet (500 mg total) by mouth every 8 (eight) hours as needed for muscle spasms. Patient not taking: Reported on 04/10/2020 03/20/20   Vernetta Honey, PA-C    Allergies    Patient has no known allergies.  Review of Systems   Review of Systems  Constitutional: Positive for chills and fever.  Respiratory: Negative for cough and shortness of breath.   Cardiovascular: Positive for chest pain.  Gastrointestinal: Positive for abdominal pain, nausea and vomiting. Negative for constipation and diarrhea.  Musculoskeletal: Positive for arthralgias.  All other systems reviewed and are negative.   Physical Exam Updated Vital Signs BP (!) 145/95   Pulse 100   Temp (!) 100.5 F (38.1 C) (Oral)   Resp 16   Ht 5\' 5"  (1.651 m)   Wt 70.3 kg   SpO2 100%   BMI 25.79 kg/m   Physical Exam Vitals and nursing note reviewed.  Constitutional:      Appearance: He is not ill-appearing or diaphoretic.  HENT:     Head: Normocephalic and atraumatic.  Eyes:     Conjunctiva/sclera: Conjunctivae normal.  Cardiovascular:     Rate and Rhythm: Regular rhythm. Tachycardia present.     Pulses:          Radial pulses are 2+ on the right side and 2+ on the left side.       Dorsalis pedis pulses are 2+ on the right side and 2+ on the left side.  Pulmonary:     Effort: Pulmonary effort is normal.     Breath  sounds: Normal breath sounds. No decreased breath sounds, wheezing, rhonchi or rales.  Chest:     Chest wall: Tenderness present.  Abdominal:     Palpations: Abdomen is soft.     Tenderness: There is abdominal tenderness. There is no guarding or rebound.     Comments: Mild periumbilical abdominal TTP, no rebound or guarding  Musculoskeletal:     Cervical back: Neck supple.     Comments: Well healed incision to L shoulder with TTP diffusely to LUE. No obvious edema compared to RUE. 2+ radial pulse.   Skin:    General: Skin is warm and dry.  Neurological:     Mental Status: He is alert.  ED Results / Procedures / Treatments   Labs (all labs ordered are listed, but only abnormal results are displayed) Labs Reviewed  COMPREHENSIVE METABOLIC PANEL - Abnormal; Notable for the following components:      Result Value   Glucose, Bld 125 (*)    Total Protein 8.9 (*)    Total Bilirubin 1.5 (*)    All other components within normal limits  CBC - Abnormal; Notable for the following components:   Hemoglobin 10.4 (*)    HCT 32.5 (*)    MCV 68.9 (*)    MCH 22.0 (*)    RDW 27.2 (*)    All other components within normal limits  RETICULOCYTES - Abnormal; Notable for the following components:   Retic Ct Pct 3.9 (*)    Immature Retic Fract 22.0 (*)    All other components within normal limits  SARS CORONAVIRUS 2 BY RT PCR (HOSPITAL ORDER, [facility])  CULTURE, BLOOD (ROUTINE X 2)  CULTURE, BLOOD (ROUTINE X 2)  LIPASE, BLOOD  URINALYSIS, ROUTINE W REFLEX MICROSCOPIC  TROPONIN I (HIGH SENSITIVITY)  TROPONIN I (HIGH SENSITIVITY)    EKG None  Radiology DG Chest 2 View  Result Date: 04/10/2020 CLINICAL DATA:  Chest pain EXAM: CHEST - 2 VIEW COMPARISON:  December 21, 2012 FINDINGS: The heart size and mediastinal contours are within normal limits. Both lungs are clear. A left reverse total shoulder arthroplasty is present. No acute osseous abnormality is seen.  IMPRESSION: No active cardiopulmonary disease. Electronically Signed   By: Prudencio Pair M.D.   On: 04/10/2020 00:08   CT Angio Chest PE W/Cm &/Or Wo Cm  Result Date: 04/10/2020 CLINICAL DATA:  Left chest pain radiating to shoulder, sickle cell disease EXAM: CT ANGIOGRAPHY CHEST WITH CONTRAST TECHNIQUE: Multidetector CT imaging of the chest was performed using the standard protocol during bolus administration of intravenous contrast. Multiplanar CT image reconstructions and MIPs were obtained to evaluate the vascular anatomy. CONTRAST:  173mL OMNIPAQUE IOHEXOL 350 MG/ML SOLN COMPARISON:  04/10/2020 FINDINGS: Cardiovascular: This is a technically adequate evaluation of the pulmonary vasculature. No filling defects or pulmonary emboli. Dilated main pulmonary artery consistent with pulmonary arterial hypertension. The heart is mildly enlarged without pericardial effusion. Normal appearance of the thoracic aorta without aneurysm or dissection. Mediastinum/Nodes: No enlarged mediastinal, hilar, or axillary lymph nodes. Thyroid gland, trachea, and esophagus demonstrate no significant findings. Lungs/Pleura: No acute airspace disease, effusion, or pneumothorax. Scattered scarring at the lung bases. Central airways are patent. Upper Abdomen: No acute abnormality. Musculoskeletal: Extensive bony changes related to sickle cell disease. Left shoulder arthroplasty. No acute bony abnormalities. Reconstructed images demonstrate no additional findings. Review of the MIP images confirms the above findings. IMPRESSION: 1. No evidence of pulmonary embolus. 2. Dilated main pulmonary artery consistent with pulmonary arterial hypertension. 3. Chronic changes related to sickle cell disease. No acute intrathoracic process. Electronically Signed   By: Randa Ngo M.D.   On: 04/10/2020 01:43    Procedures Procedures (including critical care time)  Medications Ordered in ED Medications  0.45 % sodium chloride infusion (  Intravenous New Bag/Given 04/10/20 0014)  sodium chloride (PF) 0.9 % injection (has no administration in time range)  sodium chloride (PF) 0.9 % injection (has no administration in time range)  ceFEPIme (MAXIPIME) 2 g in sodium chloride 0.9 % 100 mL IVPB (has no administration in time range)  metroNIDAZOLE (FLAGYL) IVPB 500 mg (has no administration in time range)  vancomycin (VANCOCIN) IVPB 1000 mg/200  mL premix (has no administration in time range)  sodium chloride flush (NS) 0.9 % injection 3 mL (3 mLs Intravenous Given 04/10/20 0010)  acetaminophen (TYLENOL) tablet 650 mg (650 mg Oral Given 04/10/20 0010)  HYDROmorphone (DILAUDID) injection 2 mg (2 mg Intravenous Given 04/10/20 0016)  HYDROmorphone (DILAUDID) injection 2 mg (2 mg Intravenous Given 04/10/20 0102)  ketorolac (TORADOL) 15 MG/ML injection 15 mg (15 mg Intravenous Given 04/10/20 0011)  ondansetron (ZOFRAN-ODT) disintegrating tablet 8 mg (8 mg Oral Given 04/10/20 0009)  iohexol (OMNIPAQUE) 350 MG/ML injection 100 mL (100 mLs Intravenous Contrast Given 04/10/20 0129)  oxyCODONE (Oxy IR/ROXICODONE) immediate release tablet 15 mg (15 mg Oral Given 04/10/20 0447)  iohexol (OMNIPAQUE) 300 MG/ML solution 75 mL (75 mLs Intravenous Contrast Given 04/10/20 0513)    ED Course  I have reviewed the triage vital signs and the nursing notes.  Pertinent labs & imaging results that were available during my care of the patient were reviewed by me and considered in my medical decision making (see chart for details).    MDM Rules/Calculators/A&P                      49 year old male with a history of sickle cell anemia presents the ED today with complaint of chest pain, left shoulder pain, abdominal pain, nausea, vomiting.  Arrival to the ED patient is febrile with a temp of 100.5.  Mildly tachycardic in the low 100s.  Nontachypneic.  Satting 100% on room air.  That he will typically have pain in his chest.  He does however note he had a total left  shoulder arthroplasty done on 03/18/2020.  Currently taking Lovenox at home has not missed any doses.  Sickle cell anemia there is concern for PE.  Will need CTA at this time.  Will obtain chest x-ray to rule out any acute chest syndrome as well.  OB team currently at bedside trying to get access.  Will give Zofran ODT as patient is actively retching in the room.  He has mild periumbilical abdominal tenderness however states it is mostly his chest pain that brought him into the ED today.  Will provide fluids as well.  Patient will likely need admission.  CXR negative.  CBC without leukocytosis. Hgb 10.4 CMP with glucose 125. No other abnormality Lipase 27  COVID test negative CTA negative for PE.   On reevaluation pt with more abdominal TTP than prior. Will add on CT A/P at this time. His fever has been controlled with tylenol and he is eating and drinking after zofran and fluids however given sickle cell pt with fever feel he needs admission. Will order abx to cover for unknown infection source.   While pt was being taken to CT scan his IV infiltrated; unable to get CT A/P at this time. Awaiting IV team consult to establish line again.   Discussed case with Dr. Thomes Dinning who will evaluate patient for admission pending CT scan.   This note was prepared using Dragon voice recognition software and may include unintentional dictation errors due to the inherent limitations of voice recognition software.  Final Clinical Impression(s) / ED Diagnoses Final diagnoses:  Hb-SS disease with crisis and other complication  Fever of unknown origin  Generalized abdominal pain  Non-intractable vomiting with nausea, unspecified vomiting type    Rx / DC Orders ED Discharge Orders    None       Tanda Rockers, PA-C 04/10/20 0610    Molpus, Jonny Ruiz, MD  04/10/20 0704  

## 2020-04-10 ENCOUNTER — Emergency Department (HOSPITAL_COMMUNITY): Payer: BC Managed Care – PPO

## 2020-04-10 ENCOUNTER — Encounter (HOSPITAL_COMMUNITY): Payer: Self-pay

## 2020-04-10 ENCOUNTER — Inpatient Hospital Stay (HOSPITAL_COMMUNITY): Payer: BC Managed Care – PPO

## 2020-04-10 DIAGNOSIS — Z96612 Presence of left artificial shoulder joint: Secondary | ICD-10-CM | POA: Diagnosis present

## 2020-04-10 DIAGNOSIS — J9811 Atelectasis: Secondary | ICD-10-CM | POA: Diagnosis present

## 2020-04-10 DIAGNOSIS — D638 Anemia in other chronic diseases classified elsewhere: Secondary | ICD-10-CM | POA: Diagnosis present

## 2020-04-10 DIAGNOSIS — D5700 Hb-SS disease with crisis, unspecified: Secondary | ICD-10-CM | POA: Diagnosis present

## 2020-04-10 DIAGNOSIS — R509 Fever, unspecified: Secondary | ICD-10-CM | POA: Diagnosis present

## 2020-04-10 DIAGNOSIS — K8020 Calculus of gallbladder without cholecystitis without obstruction: Secondary | ICD-10-CM | POA: Diagnosis present

## 2020-04-10 DIAGNOSIS — D57219 Sickle-cell/Hb-C disease with crisis, unspecified: Secondary | ICD-10-CM | POA: Diagnosis present

## 2020-04-10 DIAGNOSIS — I2721 Secondary pulmonary arterial hypertension: Secondary | ICD-10-CM | POA: Diagnosis present

## 2020-04-10 DIAGNOSIS — Z20822 Contact with and (suspected) exposure to covid-19: Secondary | ICD-10-CM | POA: Diagnosis present

## 2020-04-10 DIAGNOSIS — R1084 Generalized abdominal pain: Secondary | ICD-10-CM | POA: Diagnosis not present

## 2020-04-10 DIAGNOSIS — Z79899 Other long term (current) drug therapy: Secondary | ICD-10-CM | POA: Diagnosis not present

## 2020-04-10 DIAGNOSIS — Z87891 Personal history of nicotine dependence: Secondary | ICD-10-CM | POA: Diagnosis not present

## 2020-04-10 DIAGNOSIS — D5709 Hb-ss disease with crisis with other specified complication: Secondary | ICD-10-CM | POA: Diagnosis not present

## 2020-04-10 DIAGNOSIS — G894 Chronic pain syndrome: Secondary | ICD-10-CM | POA: Diagnosis present

## 2020-04-10 DIAGNOSIS — K219 Gastro-esophageal reflux disease without esophagitis: Secondary | ICD-10-CM | POA: Diagnosis present

## 2020-04-10 LAB — URINALYSIS, ROUTINE W REFLEX MICROSCOPIC
Bilirubin Urine: NEGATIVE
Glucose, UA: NEGATIVE mg/dL
Hgb urine dipstick: NEGATIVE
Ketones, ur: NEGATIVE mg/dL
Leukocytes,Ua: NEGATIVE
Nitrite: NEGATIVE
Protein, ur: NEGATIVE mg/dL
Specific Gravity, Urine: 1.016 (ref 1.005–1.030)
pH: 6 (ref 5.0–8.0)

## 2020-04-10 LAB — COMPREHENSIVE METABOLIC PANEL
ALT: 28 U/L (ref 0–44)
AST: 23 U/L (ref 15–41)
Albumin: 5 g/dL (ref 3.5–5.0)
Alkaline Phosphatase: 63 U/L (ref 38–126)
Anion gap: 13 (ref 5–15)
BUN: 16 mg/dL (ref 6–20)
CO2: 27 mmol/L (ref 22–32)
Calcium: 10 mg/dL (ref 8.9–10.3)
Chloride: 99 mmol/L (ref 98–111)
Creatinine, Ser: 1.13 mg/dL (ref 0.61–1.24)
GFR calc Af Amer: >60 mL/min (ref >60)
GFR calc non Af Amer: >60 mL/min (ref >60)
Glucose, Bld: 125 mg/dL — ABNORMAL HIGH (ref 70–99)
Potassium: 3.8 mmol/L (ref 3.5–5.1)
Sodium: 139 mmol/L (ref 135–145)
Total Bilirubin: 1.5 mg/dL — ABNORMAL HIGH (ref 0.3–1.2)
Total Protein: 8.9 g/dL — ABNORMAL HIGH (ref 6.5–8.1)

## 2020-04-10 LAB — CBC
HCT: 32.5 % — ABNORMAL LOW (ref 39.0–52.0)
Hemoglobin: 10.4 g/dL — ABNORMAL LOW (ref 13.0–17.0)
MCH: 22 pg — ABNORMAL LOW (ref 26.0–34.0)
MCHC: 32 g/dL (ref 30.0–36.0)
MCV: 68.9 fL — ABNORMAL LOW (ref 80.0–100.0)
Platelets: 325 10*3/uL (ref 150–400)
RBC: 4.72 MIL/uL (ref 4.22–5.81)
RDW: 27.2 % — ABNORMAL HIGH (ref 11.5–15.5)
WBC: 6.8 10*3/uL (ref 4.0–10.5)
nRBC: 0 % (ref 0.0–0.2)

## 2020-04-10 LAB — RETICULOCYTES
Immature Retic Fract: 22 % — ABNORMAL HIGH (ref 2.3–15.9)
RBC.: 4.72 MIL/uL (ref 4.22–5.81)
Retic Count, Absolute: 186 10*3/uL (ref 19.0–186.0)
Retic Ct Pct: 3.9 % — ABNORMAL HIGH (ref 0.4–3.1)

## 2020-04-10 LAB — TROPONIN I (HIGH SENSITIVITY)
Troponin I (High Sensitivity): 3 ng/L (ref <18)
Troponin I (High Sensitivity): 3 ng/L (ref <18)

## 2020-04-10 LAB — SARS CORONAVIRUS 2 BY RT PCR (HOSPITAL ORDER, PERFORMED IN ~~LOC~~ HOSPITAL LAB): SARS Coronavirus 2: NEGATIVE

## 2020-04-10 LAB — LIPASE, BLOOD: Lipase: 27 U/L (ref 11–51)

## 2020-04-10 MED ORDER — SODIUM CHLORIDE (PF) 0.9 % IJ SOLN
INTRAMUSCULAR | Status: AC
  Filled 2020-04-10: qty 50

## 2020-04-10 MED ORDER — SODIUM CHLORIDE 0.9% FLUSH: 9.0000 mL | INTRAVENOUS | Status: DC | PRN

## 2020-04-10 MED ORDER — KETOROLAC TROMETHAMINE 15 MG/ML IJ SOLN
15.0000 mg | Freq: Four times a day (QID) | INTRAMUSCULAR | Status: DC
  Administered 2020-04-10 – 2020-04-12 (×7): 15 mg via INTRAVENOUS
  Filled 2020-04-10 (×7): qty 1

## 2020-04-10 MED ORDER — ONDANSETRON HCL 4 MG/2ML IJ SOLN: 4.0000 mg | Freq: Four times a day (QID) | INTRAMUSCULAR | Status: DC | PRN

## 2020-04-10 MED ORDER — OMEGA-3-ACID ETHYL ESTERS 1 G PO CAPS
1.0000 g | ORAL_CAPSULE | Freq: Every day | ORAL | Status: DC
  Administered 2020-04-10 – 2020-04-12 (×3): 1 g via ORAL
  Filled 2020-04-10 (×3): qty 1

## 2020-04-10 MED ORDER — HYDROMORPHONE HCL 1 MG/ML IJ SOLN: 1.0000 mg | INTRAMUSCULAR | Status: DC | PRN

## 2020-04-10 MED ORDER — SENNOSIDES-DOCUSATE SODIUM 8.6-50 MG PO TABS
1.0000 | ORAL_TABLET | Freq: Two times a day (BID) | ORAL | Status: DC
  Administered 2020-04-10 – 2020-04-12 (×5): 1 via ORAL
  Filled 2020-04-10 (×5): qty 1

## 2020-04-10 MED ORDER — METHOCARBAMOL 500 MG PO TABS
500.0000 mg | ORAL_TABLET | Freq: Three times a day (TID) | ORAL | Status: DC | PRN
  Administered 2020-04-11: 500 mg via ORAL
  Filled 2020-04-10: qty 1

## 2020-04-10 MED ORDER — SODIUM CHLORIDE 0.9 % IV SOLN
25.0000 mg | INTRAVENOUS | Status: DC | PRN
  Filled 2020-04-10: qty 0.5

## 2020-04-10 MED ORDER — IOHEXOL 300 MG/ML  SOLN
75.0000 mL | Freq: Once | INTRAMUSCULAR | Status: AC | PRN
  Administered 2020-04-10: 75 mL via INTRAVENOUS

## 2020-04-10 MED ORDER — SODIUM CHLORIDE 0.9 % IV SOLN: 500.0000 mg | Freq: Once | INTRAVENOUS | Status: DC

## 2020-04-10 MED ORDER — METRONIDAZOLE IN NACL 5-0.79 MG/ML-% IV SOLN
500.0000 mg | Freq: Once | INTRAVENOUS | Status: AC
  Administered 2020-04-10: 500 mg via INTRAVENOUS
  Filled 2020-04-10: qty 100

## 2020-04-10 MED ORDER — HYDROMORPHONE 1 MG/ML IV SOLN
INTRAVENOUS | Status: DC
  Administered 2020-04-10: 30 mg via INTRAVENOUS
  Administered 2020-04-11: 2.5 mg via INTRAVENOUS
  Administered 2020-04-11: 1.5 mg via INTRAVENOUS
  Administered 2020-04-11 (×2): 1 mg via INTRAVENOUS
  Administered 2020-04-12 (×2): 2 mg via INTRAVENOUS
  Administered 2020-04-12: 1 mg via INTRAVENOUS
  Filled 2020-04-10: qty 30

## 2020-04-10 MED ORDER — IOHEXOL 350 MG/ML SOLN
100.0000 mL | Freq: Once | INTRAVENOUS | Status: AC | PRN
  Administered 2020-04-10: 100 mL via INTRAVENOUS

## 2020-04-10 MED ORDER — GADOBUTROL 1 MMOL/ML IV SOLN
7.0000 mL | Freq: Once | INTRAVENOUS | Status: AC | PRN
  Administered 2020-04-10: 7 mL via INTRAVENOUS

## 2020-04-10 MED ORDER — SODIUM CHLORIDE 0.9 % IV SOLN
2.0000 g | Freq: Once | INTRAVENOUS | Status: AC
  Administered 2020-04-10: 2 g via INTRAVENOUS
  Filled 2020-04-10: qty 2

## 2020-04-10 MED ORDER — ZINC SULFATE 220 (50 ZN) MG PO CAPS
220.0000 mg | ORAL_CAPSULE | Freq: Every day | ORAL | Status: DC
  Administered 2020-04-10 – 2020-04-12 (×3): 220 mg via ORAL
  Filled 2020-04-10 (×3): qty 1

## 2020-04-10 MED ORDER — NALOXONE HCL 0.4 MG/ML IJ SOLN: 0.4000 mg | INTRAMUSCULAR | Status: DC | PRN

## 2020-04-10 MED ORDER — VANCOMYCIN HCL IN DEXTROSE 1-5 GM/200ML-% IV SOLN
1000.0000 mg | Freq: Once | INTRAVENOUS | Status: AC
  Administered 2020-04-10: 1000 mg via INTRAVENOUS
  Filled 2020-04-10: qty 200

## 2020-04-10 MED ORDER — FOLIC ACID 1 MG PO TABS
1000.0000 ug | ORAL_TABLET | Freq: Every day | ORAL | Status: DC
  Administered 2020-04-10 – 2020-04-12 (×3): 1 mg via ORAL
  Filled 2020-04-10 (×3): qty 1

## 2020-04-10 MED ORDER — DIPHENHYDRAMINE HCL 25 MG PO CAPS: 25.0000 mg | ORAL_CAPSULE | ORAL | Status: DC | PRN

## 2020-04-10 MED ORDER — ENOXAPARIN SODIUM 40 MG/0.4ML ~~LOC~~ SOLN
40.0000 mg | SUBCUTANEOUS | Status: DC
  Administered 2020-04-10 – 2020-04-11 (×2): 40 mg via SUBCUTANEOUS
  Filled 2020-04-10 (×2): qty 0.4

## 2020-04-10 MED ORDER — POLYETHYLENE GLYCOL 3350 17 G PO PACK: 17.0000 g | PACK | Freq: Every day | ORAL | Status: DC | PRN

## 2020-04-10 MED ORDER — HYDROMORPHONE HCL 1 MG/ML IJ SOLN
1.0000 mg | Freq: Once | INTRAMUSCULAR | Status: AC
  Administered 2020-04-10: 1 mg via INTRAVENOUS
  Filled 2020-04-10: qty 1

## 2020-04-10 MED ORDER — OXYCODONE HCL 5 MG PO TABS
15.0000 mg | ORAL_TABLET | Freq: Once | ORAL | Status: AC
  Administered 2020-04-10: 15 mg via ORAL
  Filled 2020-04-10: qty 3

## 2020-04-10 NOTE — H&P (Signed)
H&P  Patient Demographics:  [name], is a 49 y.o. male  MRN: [id number]   DOB - Apr 20, 1971  Admit Date - 04/09/2020  Outpatient Primary MD for the patient is Gwenyth Bender, MD  Chief Complaint  Patient presents with  . Chest Pain  . Shoulder Pain  . Nausea  . Emesis  . Abdominal Pain      HPI:   [name]  is a 49 y.o. male with a medical history significant for sickle cell disease type Esmond, avascular necrosis of left femoral head, history of gastritis, history of osteoarthritis, history of tobacco dependence, and history of anemia of chronic disease presented to the emergency room with complaints of left shoulder and left forearm pain over the past week. Patient states that pain intensity has been increased and unrelieved by home medications. Patient is status post total left shoulder arthroplasty on 03/18/2020 and is followed by orthopedic specialist. Patient says that his pain has been mostly uncontrolled. Pain intensity is 8/10 characterized as constant and throbbing. On presentation, patient endorsed abdominal pain, nausea, and vomiting that has since resolved. Patient denies fever, chills, shortness of breath, urinary symptoms, nausea, constipation vomiting, or diarrhea. He denies paresthesias or dizziness. No sick contacts, recent travel, or known exposure to COVID-19 infection.  Emergency room course: Vital signs show temperature of 99.3 F, BP 130/81, pulse 97, and oxygen saturation 100% on RA. Bilirubin slightly elevated at 1.5. Lipase negative. High-sensitivity troponin within normal range. Hemoglobin is 10.4, which appears to be consistent with patient's baseline. On presenting to the emergency department patient complained of some abdominal and chest pain. He underwent CT of chest that showed no evidence of pulmonary embolism, dilated main pulmonary artery that is consistent with pulmonary artery hypertension, and chronic changes related to sickle cell disease. Patient  complained of abdominal pain, there was no acute abdominal, pelvic findings, masses, lesions, or adenopathy. Patchy left basilar atelectasis versus an early infiltrate noted. Patient has contracted gallbladder with suspicions of gallstones. Right upper quadrant ultrasound warranted. Patient also has splenomegaly without findings for splenic infarct. Patient spiked a temperature of 100.5 while in ER. Patient underwent left shoulder x-ray to rule out infection. He is status post total shoulder replacement on the left. No fractures, dislocation, or acute findings. 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 COVID-19 test negative. Acute cholecystitis ruled out. Patient admitted to MedSurg for further management of sickle cell pain crisis.   Review of systems:  Review of Systems  Constitutional: Negative for chills and fever.  HENT: Negative.   Eyes: Negative.   Respiratory: Negative.   Cardiovascular: Positive for chest pain. Negative for palpitations and leg swelling.  Gastrointestinal: Negative.   Genitourinary: Negative.   Musculoskeletal: Positive for joint pain (left shoulder, left forearm).  Skin: Negative.   Neurological: Negative.   Psychiatric/Behavioral: Negative.     A full 10 point Review of Systems was done, except as stated above, all other Review of Systems were negative.  With Past History of the following :   Past Medical History:  Diagnosis Date  . Avascular necrosis of left femoral head (HCC)   . Erectile dysfunction   . Gastritis and duodenitis   . GERD (gastroesophageal reflux disease)   . Low testosterone   . Mental disorder   . Mixed restrictive and obstructive lung disease (HCC)   . Osteoarthrosis, unspecified whether generalized or localized, unspecified site   . Pain in joint, site unspecified   .  Physical exam, annual 02/18/2010  . Sickle cell anemia (HCC)   . Tobacco use       Past  Surgical History:  Procedure Laterality Date  . ELECTROCARDIOGRAM  01/31/2011  . ESOPHAGOGASTRODUODENOSCOPY  01/03/2013   Procedure: ESOPHAGOGASTRODUODENOSCOPY (EGD);  Surgeon: Meryl Dare, MD,FACG;  Location: [facility];  Service: Endoscopy;  Laterality: N/A;  . TOTAL SHOULDER ARTHROPLASTY Left 03/18/2020   Procedure: TOTAL SHOULDER ARTHROPLASTY;  Surgeon: Bjorn Pippin, MD;  Location: [facility];  Service: Orthopedics;  Laterality: Left;  . UPPER GASTROINTESTINAL ENDOSCOPY  11/18/2010     Social History:   Social History   Tobacco Use  . Smoking status: Former Games developer  . Smokeless tobacco: Never Used  Substance Use Topics  . Alcohol use: Yes    Alcohol/week: 0.0 standard drinks    Comment: OCCASIONAL     Lives - At home   Family History :   History reviewed. No pertinent family history.   Home Medications:   Prior to Admission medications   Medication Sig Start Date End Date Taking? Authorizing Provider  acetaminophen (TYLENOL) 500 MG tablet Take 1,000 mg by mouth every 4 (four) hours as needed for mild pain.   Yes [provider]  Cholecalciferol (VITAMIN D3) 250 MCG (10000 UT) TABS Take 10,000 Units by mouth daily.   Yes [provider]  enoxaparin (LOVENOX) 40 MG/0.4ML injection Inject 0.4 mLs (40 mg total) into the skin daily. 03/20/20 04/19/20 Yes McBane, Jerald Kief, PA-C  ferrous sulfate 325 (65 FE) MG tablet Take 325 mg by mouth daily with breakfast.   Yes [provider]  folic acid (FOLVITE) 400 MCG tablet Take 400 mcg by mouth daily.   Yes [provider]  Omega-3 Fatty Acids (FISH OIL) 1000 MG CAPS Take 1,000 mg by mouth daily.   Yes [provider]  ondansetron (ZOFRAN) 4 MG tablet Take 4 mg by mouth every 8 (eight) hours as needed for nausea or vomiting.   Yes [provider]  oxyCODONE-acetaminophen (PERCOCET) 10-325 MG tablet Take 1 tablet by mouth every 6 (six) hours as needed for pain. 01/20/20  Yes [provider]  Zinc 50 MG TABS Take 50 mg by mouth daily.   Yes [provider]  celecoxib (CELEBREX) 100 MG capsule Take 1 capsule (100 mg total) by mouth 2 (two) times daily. 03/20/20 04/19/20  McBane, Jerald Kief, PA-C  methocarbamol (ROBAXIN) 500 MG tablet Take 1 tablet (500 mg total) by mouth every 8 (eight) hours as needed for muscle spasms. Patient not taking: Reported on 04/10/2020 03/20/20   Vernetta Honey, PA-C     Allergies:   No Known Allergies   Physical Exam:   Vitals:   Vitals:   04/10/20 2124 04/11/20 0142  BP: 121/71 114/65  Pulse: 88 74  Resp: 19 16  Temp: 98.7 F (37.1 C) 98 F (36.7 C)  SpO2: 99% 100%   Physical Exam Constitutional:      General: He is in acute distress.     Appearance: He is well-developed.  Eyes:     Pupils: Pupils are equal, round, and reactive to light.  Cardiovascular:     Rate and Rhythm: Normal rate and regular rhythm.     Heart sounds: Normal heart sounds.  Pulmonary:     Effort: Pulmonary effort is normal.     Breath sounds: Normal breath sounds.  Musculoskeletal:        General: Normal range of motion.  Cervical back: Normal range of motion.  Skin:    General: Skin is warm.  Neurological:     General: No focal deficit present.     Mental Status: He is alert and oriented to person, place, and time.  Psychiatric:        Mood and Affect: Mood normal.        Behavior: Behavior normal.      Data Review:   CBC Recent Labs  Lab 04/09/20 2357  WBC 6.8  HGB 10.4*  HCT 32.5*  PLT 325  MCV 68.9*  MCH 22.0*  MCHC 32.0  RDW 27.2*   ------------------------------------------------------------------------------------------------------------------  Chemistries  Recent Labs  Lab 04/09/20 2357  NA 139  K 3.8  CL 99  CO2 27  GLUCOSE 125*  BUN 16  CREATININE 1.13  CALCIUM 10.0  AST 23  ALT 28  ALKPHOS 63  BILITOT 1.5*    ------------------------------------------------------------------------------------------------------------------ estimated creatinine clearance is 70.3 mL/min (by C-G formula based on SCr of 1.13 mg/dL). ------------------------------------------------------------------------------------------------------------------ No results for input(s): TSH, T4TOTAL, T3FREE, THYROIDAB in the last 72 hours.  Invalid input(s): FREET3  Coagulation profile No results for input(s): INR, PROTIME in the last 168 hours. ------------------------------------------------------------------------------------------------------------------- No results for input(s): DDIMER in the last 72 hours. -------------------------------------------------------------------------------------------------------------------  Cardiac Enzymes No results for input(s): CKMB, TROPONINI, MYOGLOBIN in the last 168 hours.  Invalid input(s): CK ------------------------------------------------------------------------------------------------------------------ No results found for: BNP  ---------------------------------------------------------------------------------------------------------------  Urinalysis    Component Value Date/Time   COLORURINE YELLOW 04/10/2020 0205   APPEARANCEUR CLEAR 04/10/2020 0205   LABSPEC 1.016 04/10/2020 0205   PHURINE 6.0 04/10/2020 0205   GLUCOSEU NEGATIVE 04/10/2020 0205   HGBUR NEGATIVE 04/10/2020 0205   BILIRUBINUR NEGATIVE 04/10/2020 0205   KETONESUR NEGATIVE 04/10/2020 0205   PROTEINUR NEGATIVE 04/10/2020 0205   UROBILINOGEN 1.0 07/20/2007 1259   NITRITE NEGATIVE 04/10/2020 0205   LEUKOCYTESUR NEGATIVE 04/10/2020 0205    ----------------------------------------------------------------------------------------------------------------   Imaging Results:    DG Chest 2 View  Result Date: 04/10/2020 CLINICAL DATA:  Chest pain EXAM: CHEST - 2 VIEW COMPARISON:  December 21, 2012 FINDINGS: The  heart size and mediastinal contours are within normal limits. Both lungs are clear. A left reverse total shoulder arthroplasty is present. No acute osseous abnormality is seen. IMPRESSION: No active cardiopulmonary disease. Electronically Signed   By: Jonna ClarkBindu  Avutu M.D.   On: 04/10/2020 00:08   CT Angio Chest PE W/Cm &/Or Wo Cm  Result Date: 04/10/2020 CLINICAL DATA:  Left chest pain radiating to shoulder, sickle cell disease EXAM: CT ANGIOGRAPHY CHEST WITH CONTRAST TECHNIQUE: Multidetector CT imaging of the chest was performed using the standard protocol during bolus administration of intravenous contrast. Multiplanar CT image reconstructions and MIPs were obtained to evaluate the vascular anatomy. CONTRAST:  100mL OMNIPAQUE IOHEXOL 350 MG/ML SOLN COMPARISON:  04/10/2020 FINDINGS: Cardiovascular: This is a technically adequate evaluation of the pulmonary vasculature. No filling defects or pulmonary emboli. Dilated main pulmonary artery consistent with pulmonary arterial hypertension. The heart is mildly enlarged without pericardial effusion. Normal appearance of the thoracic aorta without aneurysm or dissection. Mediastinum/Nodes: No enlarged mediastinal, hilar, or axillary lymph nodes. Thyroid gland, trachea, and esophagus demonstrate no significant findings. Lungs/Pleura: No acute airspace disease, effusion, or pneumothorax. Scattered scarring at the lung bases. Central airways are patent. Upper Abdomen: No acute abnormality. Musculoskeletal: Extensive bony changes related to sickle cell disease. Left shoulder arthroplasty. No acute bony abnormalities. Reconstructed images demonstrate no additional findings. Review of the MIP images confirms the above findings. IMPRESSION:  1. No evidence of pulmonary embolus. 2. Dilated main pulmonary artery consistent with pulmonary arterial hypertension. 3. Chronic changes related to sickle cell disease. No acute intrathoracic process. Electronically Signed   By: Sharlet Salina M.D.   On: 04/10/2020 01:43   CT Abdomen Pelvis W Contrast  Result Date: 04/10/2020 CLINICAL DATA:  Acute abdominal pain. History of sickle cell disease. EXAM: CT ABDOMEN AND PELVIS WITH CONTRAST TECHNIQUE: Multidetector CT imaging of the abdomen and pelvis was performed using the standard protocol following bolus administration of intravenous contrast. CONTRAST:  75mL OMNIPAQUE IOHEXOL 300 MG/ML  SOLN COMPARISON:  04/15/2015 and chest CT, same date FINDINGS: Lower chest: Patchy left basilar atelectasis or early infiltrate/bronchopneumonia. Hepatobiliary: A few tiny scattered hepatic cysts. No worrisome hepatic lesions. Contracted gallbladder with suspected gallstones. Right upper quadrant ultrasound may be helpful for further evaluation. No common bile duct dilatation. Pancreas: No mass, inflammation or ductal dilatation. Incidental note is made of pancreatic divisum. Spleen: Splenomegaly. Spleen measures 14.5 x 14.5 x 9.3 cm. No splenic lesions or evidence of splenic infarct. Adrenals/Urinary Tract: Adrenal glands and kidneys are unremarkable. Contrast noted in the renal collecting systems and bladder from recent contrast enhanced chest CT. Stable left renal cyst. Stomach/Bowel: The stomach, duodenum, small bowel and colon are grossly normal without oral contrast. No acute inflammatory changes, mass lesions or obstructive findings. The terminal ileum is normal. The appendix is normal. Sigmoid colon diverticulosis without findings for acute diverticulitis. Vascular/Lymphatic: The aorta is normal in caliber. No dissection. The branch vessels are patent. The major venous structures are patent. No mesenteric or retroperitoneal mass or adenopathy. Small scattered lymph nodes are noted. Reproductive: The prostate gland and seminal vesicles are unremarkable. Other: No pelvic mass or adenopathy. No free pelvic fluid collections. No inguinal mass or adenopathy. No abdominal wall hernia or subcutaneous lesions.  Musculoskeletal: Osseous changes of sickle cell disease with H-shaped vertebrae and patchy areas of moderate sclerosis. Small focus of chronic appearing AVN involving the right femoral head. Age advanced bilateral hip joint degenerative changes. The right SI joint is largely fused. IMPRESSION: 1. No acute abdominal/pelvic findings, mass lesions or adenopathy. 2. Patchy left basilar atelectasis versus early infiltrate. 3. Contracted gallbladder with suspected gallstones. Right upper quadrant ultrasound may be helpful for further evaluation. 4. Splenomegaly without findings for splenic infarct. 5. Sigmoid colon diverticulosis without findings for acute diverticulitis. 6. Osseous changes of sickle cell disease. Electronically Signed   By: Rudie Meyer M.D.   On: 04/10/2020 09:43   MR 3D Recon At Scanner  Result Date: 04/10/2020 CLINICAL DATA:  48 year old male with history of abdominal pain and vomiting in the past 24 hours. History of sickle cell disease. EXAM: MRI ABDOMEN WITHOUT AND WITH CONTRAST (INCLUDING MRCP) TECHNIQUE: Multiplanar multisequence MR imaging of the abdomen was performed both before and after the administration of intravenous contrast. Heavily T2-weighted images of the biliary and pancreatic ducts were obtained, and three-dimensional MRCP images were rendered by post processing. CONTRAST:  7mL GADAVIST GADOBUTROL 1 MMOL/ML IV SOLN COMPARISON:  No prior abdominal MRI. Abdominal ultrasound 04/10/2020. CT the abdomen and pelvis 04/10/2020. FINDINGS: Lower chest: Unremarkable. Hepatobiliary: Subcentimeter T1 hypointense, T2 hyperintense, nonenhancing lesions are noted in the right lobe of the liver, compatible with tiny simple cysts and/or biliary hamartomas. No other suspicious appearing hepatic lesions. No intra or extrahepatic biliary ductal dilatation. Common bile duct measures 5 mm in the porta hepatis. Small filling defects within the gallbladder, compatible with gallstones. Gallbladder is  nearly completely contracted.  No pericholecystic fluid or inflammatory changes. No filling defect within the common bile duct to suggest choledocholithiasis. Pancreas: No pancreatic mass. No pancreatic ductal dilatation. No pancreatic or peripancreatic fluid collections or inflammatory changes. Spleen: Spleen is enlarged measuring 13.7 x 9.3 x 15.9 cm (estimated splenic volume of 1,013 mL) . Adrenals/Urinary Tract: Multiple T1 hypointense, T2 hyperintense, nonenhancing lesions are noted in the kidneys bilaterally, compatible with simple cysts, largest of which is in the interpolar region of the left kidney measuring 2 cm in diameter. No hydroureteronephrosis in the visualized portions of the abdomen. Bilateral adrenal glands are normal in appearance. Stomach/Bowel: Several colonic diverticulae are noted in the sigmoid colon. Vascular/Lymphatic: Aortic atherosclerosis, without evidence of aneurysm or dissection in the abdominal vasculature. No lymphadenopathy noted in the abdomen. Other: No significant volume of ascites noted in the visualized portions of the peritoneal cavity. Musculoskeletal: No aggressive appearing osseous lesions are noted in the visualized portions of the skeleton. IMPRESSION: 1. No acute findings are noted in the abdomen to account for the patient's symptoms. 2. Cholelithiasis without evidence of acute cholecystitis at this time. 3. Splenomegaly. 4. Additional incidental findings, as above. Electronically Signed   By: Vinnie Langton M.D.   On: 04/10/2020 14:58   DG Shoulder Left  Result Date: 04/10/2020 CLINICAL DATA:  Pain EXAM: LEFT SHOULDER - 2+ VIEW COMPARISON:  March 18, 2020; CT left shoulder January 24, 2020 FINDINGS: Oblique and Y scapular images were obtained. Patient is status post total shoulder replacement with prosthetic components well-seated. No fracture or dislocation. 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 There is no appreciable  underlying arthropathy. Visualized left lung clear. IMPRESSION: Status post total shoulder replacement on the left with prosthetic components appearing well-seated. No fracture or dislocation. 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 No appreciable arthropathic change. Electronically Signed   By: Lowella Grip III M.D.   On: 04/10/2020 10:45   MR ABDOMEN MRCP W WO CONTAST  Result Date: 04/10/2020 CLINICAL DATA:  49 year old male with history of abdominal pain and vomiting in the past 24 hours. History of sickle cell disease. EXAM: MRI ABDOMEN WITHOUT AND WITH CONTRAST (INCLUDING MRCP) TECHNIQUE: Multiplanar multisequence MR imaging of the abdomen was performed both before and after the administration of intravenous contrast. Heavily T2-weighted images of the biliary and pancreatic ducts were obtained, and three-dimensional MRCP images were rendered by post processing. CONTRAST:  73mL GADAVIST GADOBUTROL 1 MMOL/ML IV SOLN COMPARISON:  No prior abdominal MRI. Abdominal ultrasound 04/10/2020. CT the abdomen and pelvis 04/10/2020. FINDINGS: Lower chest: Unremarkable. Hepatobiliary: Subcentimeter T1 hypointense, T2 hyperintense, nonenhancing lesions are noted in the right lobe of the liver, compatible with tiny simple cysts and/or biliary hamartomas. No other suspicious appearing hepatic lesions. No intra or extrahepatic biliary ductal dilatation. Common bile duct measures 5 mm in the porta hepatis. Small filling defects within the gallbladder, compatible with gallstones. Gallbladder is nearly completely contracted. No pericholecystic fluid or inflammatory changes. No filling defect within the common bile duct to suggest choledocholithiasis. Pancreas: No pancreatic mass. No pancreatic ductal dilatation. No pancreatic or peripancreatic fluid collections or inflammatory changes. Spleen: Spleen is enlarged measuring 13.7 x 9.3 x 15.9 cm (estimated splenic volume of 1,013 mL) .  Adrenals/Urinary Tract: Multiple T1 hypointense, T2 hyperintense, nonenhancing lesions are noted in the kidneys bilaterally, compatible with simple cysts, largest of which is in the interpolar region of the left kidney measuring 2 cm in diameter. No hydroureteronephrosis in the visualized portions of  the abdomen. Bilateral adrenal glands are normal in appearance. Stomach/Bowel: Several colonic diverticulae are noted in the sigmoid colon. Vascular/Lymphatic: Aortic atherosclerosis, without evidence of aneurysm or dissection in the abdominal vasculature. No lymphadenopathy noted in the abdomen. Other: No significant volume of ascites noted in the visualized portions of the peritoneal cavity. Musculoskeletal: No aggressive appearing osseous lesions are noted in the visualized portions of the skeleton. IMPRESSION: 1. No acute findings are noted in the abdomen to account for the patient's symptoms. 2. Cholelithiasis without evidence of acute cholecystitis at this time. 3. Splenomegaly. 4. Additional incidental findings, as above. Electronically Signed   By: Trudie Reed M.D.   On: 04/10/2020 14:58   US Abdomen Limited RUQ  Result Date: 04/10/2020 CLINICAL DATA:  Upper abdominal pain EXAM: ULTRASOUND ABDOMEN LIMITED RIGHT UPPER QUADRANT COMPARISON:  CT abdomen and pelvis Apr 10, 2020 FINDINGS: Gallbladder: Gallbladder is contracted. There are multiple echogenic foci in the gallbladder consistent with cholelithiasis. Largest gallstone measures 6 mm in length. No appreciable gallbladder wall thickening or pericholecystic fluid. No sonographic Murphy sign noted by sonographer. Common bile duct: Diameter: 7 mm, upper normal in size. There is slight intrahepatic biliary duct dilatation. Liver: No focal lesion identified. Within normal limits in parenchymal echogenicity. Portal vein is patent on color Doppler imaging with normal direction of blood flow towards the liver. Other: None. IMPRESSION: Gallbladder contracted  with cholelithiasis. No appreciable gallbladder wall thickening or pericholecystic fluid. Slight intrahepatic biliary duct dilatation. Common bile duct upper normal in size. No obstructing focus seen by ultrasound in the biliary ductal system. If further evaluation of the biliary ductal system is felt to be warranted, MRCP would be the optimum imaging study of choice for further assessment in this regard. Electronically Signed   By: Bretta Bang III M.D.   On: 04/10/2020 10:48      Assessment & Plan:  Active Problems:   Fever of unknown origin   Sickle cell pain crisis (HCC)   Gallstones   Generalized abdominal pain   Sickle cell anemia (HCC)   Non-intractable vomiting  Sickle cell disease with pain crisis: Admit to MedSurg, IV fluids, 0.45% saline at 100 mL/h. Patient opiate tolerant, IV Dilaudid PCA with settings of 0.5 mg, 10-minute lockout, and 3 mg/h. Toradol 15 mg every 6 hours for total of 5 days, hold Celebrex. Monitor vital signs closely, reevaluate pain scale regularly, and supplemental oxygen as needed. Pain will be reevaluated in context of function and relationship to baseline as his care progresses.  Chronic pain syndrome: Hold home pain medications, use PCA Dilaudid as substitute.  Sickle cell anemia: Hemoglobin 10.7, consistent with patient's baseline. There is no clinical indication for blood transfusion at this time. Continue to monitor closely. CBC in a.m. Continue folic acid 1 mg daily  History of left shoulder total arthroplasty: Patient's pain is localized to left shoulder. He is status post total left shoulder arthroplasty on 03/18/2020. He is followed as an outpatient by Dr. Ramond Marrow, orthopedic specialist. Left shoulder x-ray showed no appreciable arthropathic changes.  Abdominal pain: Patient presented with generalized abdominal pain with some nausea, and vomiting over the past several days. Patient underwent CT of abdomen, which was suspicious for  gallstones. MRCP showed no acute findings that were consistent with patient's symptoms. Cholelithiasis without evidence of acute cholecystitis was noted. Advance diet as tolerated, monitor closely.   DVT Prophylaxis: Subcut Lovenox   AM Labs Ordered, also please review Full Orders  Family Communication: Admission, patient's condition and plan  of care including tests being ordered have been discussed with the patient who indicate understanding and agree with the plan and Code Status.  Code Status: Full Code  Consults called: None    Admission status: Inpatient    Time spent in minutes : 50 minutes  Nolon Nations  APRN, MSN, FNP-C [facility] [address] [place], [place] [address] [phone]  04/11/2020 at 5:27 AM

## 2020-04-10 NOTE — ED Notes (Addendum)
Attempted to stick pt for 2nd blood culture and HIV labs, but unable to collect labs due to pt being a hard stick. Will consult IV team for blood draw.

## 2020-04-10 NOTE — ED Notes (Signed)
Pt transported to CT ?

## 2020-04-10 NOTE — Progress Notes (Signed)
   ORTHOPAEDIC PROGRESS NOTE  s/p Procedure(s): LEFT REVERSE TOTAL SHOULDER ARTHROPLASTY on 03/19/2020 by Dr. Everardo Pacific  SUBJECTIVE: Patient presented to [facility] ED with complaint of chest pain, left shoulder pain, abdominal pain, nausea, vomiting. Patient reports about 2 days ago he began to feel very nauseous. He had recently weaned himself off of narcotics by discontinuing them completely. He was vomiting a lot yesterday. The vomiting triggered pain in his left shoulder. Prior to this, his pain has been well controlled in his left shoulder.   OBJECTIVE: PE: Left upper extremity: Incision CDI.  full and painless ROM throughout hand with DPC of 0. + Motor in  AIN, PIN, Ulnar distributions. Axillary nerve sensation preserved and symmetric.  Sensation intact in medial, radial, and ulnar distributions. Well perfused digits.   Vitals:   04/10/20 1300 04/10/20 1433  BP: 109/74 131/80  Pulse: 74 83  Resp: 10 15  Temp:    SpO2: 99% 99%     ASSESSMENT: [name] is a 49 y.o. male with history of Sickle Cell Disease status post left reverse total shoulder arthroplasty.  PLAN:  Patient presented to the ED with complains of abdominal pain, nausea, and vomiting. His abdominal work-up was negative for any acute findings. His symptoms have improved with medication. He was sitting up eating a sandwich upon my evaluation and reports his symptoms have improved significantly. He is being admitted to the sickle cell team for monitoring. In regards to his left shoulder, he had an increase in pain after multiple episodes of vomiting. Left shoulder x-rays obtained today and demonstrate stable position of the reverse total shoulder implants without any hardware complicating features. Incision is benign and healing well. No concerns of infection.   Weightbearing: NWB LUE Insicional and dressing care: N/A - clean and open to air Orthopedic device(s): Sling - will order a new sling for patient's  admission as he left his sling at home Pain control: per sickle cell team Follow - up plan: Patient has scheduled follow-up appointment for his shoulder on Tuesday, 04/14/2020.    Alfonse Alpers, PA-C 04/10/2020

## 2020-04-10 NOTE — ED Notes (Signed)
Transported to XR  

## 2020-04-10 NOTE — ED Notes (Signed)
Transported to MRI

## 2020-04-10 NOTE — Progress Notes (Signed)
A consult was received from an ED physician for vancomycin and cefepime per pharmacy dosing.  The patient's profile has been reviewed for ht/wt/allergies/indication/available labs.    A one time order has been placed for cefepime 2 gm IV and vancomycin 1gm .  Further antibiotics/pharmacy consults should be ordered by admitting physician if indicated.                       Thank you, Lucia Gaskins 04/10/2020  5:27 AM

## 2020-04-10 NOTE — ED Notes (Signed)
ED TO INPATIENT HANDOFF REPORT  Name/Age/Gender [name] 49 y.o. male  Code Status    Code Status Orders  (From admission, onward)         Start     Ordered   04/10/20 1519  Full code  Continuous     04/10/20 1518        Code Status History    Date Active Date Inactive Code Status Order ID Comments User Context   03/18/2020 1511 03/20/2020 1914 Full Code [id number]  Zella Ball Inpatient   03/15/2016 1153 03/15/2016 2001 Full Code [id number]  Quentin Angst, MD Inpatient   Advance Care Planning Activity      Home/SNF/Other Home  Chief Complaint Sickle cell pain crisis (HCC) [D57.00]  Level of Care/Admitting Diagnosis ED Disposition    ED Disposition Condition Comment   Admit  Hospital Area: [facility] [[id number]]  Level of Care: Med-Surg [16]  May admit patient to [facility] or [facility] if equivalent level of care is available:: Yes  Covid Evaluation: Asymptomatic Screening Protocol (No Symptoms)  Diagnosis: Sickle cell pain crisis [facility]) [[id number]]  Admitting Physician: Quentin Angst [[id number]]  Attending Physician: Quentin Angst [[id number]]  Estimated length of stay: past midnight tomorrow  Certification:: I certify this patient will need inpatient services for at least 2 midnights       Medical History Past Medical History:  Diagnosis Date  . Avascular necrosis of left femoral head (HCC)   . Erectile dysfunction   . Gastritis and duodenitis   . GERD (gastroesophageal reflux disease)   . Low testosterone   . Mental disorder   . Mixed restrictive and obstructive lung disease (HCC)   . Osteoarthrosis, unspecified whether generalized or localized, unspecified site   . Pain in joint, site unspecified   . Physical exam, annual 02/18/2010  . Sickle cell anemia (HCC)   . Tobacco use     Allergies No Known Allergies  IV Location/Drains/Wounds Patient Lines/Drains/Airways Status   Active  Line/Drains/Airways    Name:   Placement date:   Placement time:   Site:   Days:   Peripheral IV 04/10/20 Right Forearm   04/10/20    0731    Forearm   less than 1   Incision (Closed) 03/18/20 Shoulder Left   03/18/20    1234     23          Labs/Imaging Results for orders placed or performed during the hospital encounter of 04/09/20 (from the past 48 hour(s))  Lipase, blood     Status: None   Collection Time: 04/09/20 11:57 PM  Result Value Ref Range   Lipase 27 11 - 51 U/L    Comment: Performed at [facility], [address], [place], [place] [address]  Comprehensive metabolic panel     Status: Abnormal   Collection Time: 04/09/20 11:57 PM  Result Value Ref Range   Sodium 139 135 - 145 mmol/L   Potassium 3.8 3.5 - 5.1 mmol/L   Chloride 99 98 - 111 mmol/L   CO2 27 22 - 32 mmol/L   Glucose, Bld 125 (H) 70 - 99 mg/dL    Comment: Glucose reference range applies only to samples taken after fasting for at least 8 hours.   BUN 16 6 - 20 mg/dL   Creatinine, Ser 1.94 0.61 - 1.24 mg/dL   Calcium 17.4 8.9 - 08.1 mg/dL   Total Protein 8.9 (H) 6.5 - 8.1 g/dL  Albumin 5.0 3.5 - 5.0 g/dL   AST 23 15 - 41 U/L   ALT 28 0 - 44 U/L   Alkaline Phosphatase 63 38 - 126 U/L   Total Bilirubin 1.5 (H) 0.3 - 1.2 mg/dL   GFR calc non Af Amer >60 >60 mL/min   GFR calc Af Amer >60 >60 mL/min   Anion gap 13 5 - 15    Comment: Performed at [facility], [address] [place], [place] [address]  CBC     Status: Abnormal   Collection Time: 04/09/20 11:57 PM  Result Value Ref Range   WBC 6.8 4.0 - 10.5 K/uL   RBC 4.72 4.22 - 5.81 MIL/uL   Hemoglobin 10.4 (L) 13.0 - 17.0 g/dL   HCT 58.0 (L) 99.8 - 33.8 %   MCV 68.9 (L) 80.0 - 100.0 fL   MCH 22.0 (L) 26.0 - 34.0 pg   MCHC 32.0 30.0 - 36.0 g/dL   RDW 25.0 (H) 53.9 - 76.7 %   Platelets 325 150 - 400 K/uL    Comment: REPEATED TO VERIFY   nRBC 0.0 0.0 - 0.2 %    Comment: Performed at [facility], [address], [place], [place] [address]  Troponin I (High Sensitivity)     Status: None   Collection Time: 04/09/20 11:57 PM  Result Value Ref Range   Troponin I (High Sensitivity) 3 <18 ng/L    Comment: (NOTE) Elevated high sensitivity troponin I (hsTnI) values and significant  changes across serial measurements may suggest ACS but many other  chronic and acute conditions are known to elevate hsTnI results.  Refer to the "Links" section for chest pain algorithms and additional  guidance. Performed at [facility], [address], [place], [place] [address]   Reticulocytes     Status: Abnormal   Collection Time: 04/09/20 11:57 PM  Result Value Ref Range   Retic Ct Pct 3.9 (H) 0.4 - 3.1 %   RBC. 4.72 4.22 - 5.81 MIL/uL   Retic Count, Absolute 186.0 19.0 - 186.0 K/uL   Immature Retic Fract 22.0 (H) 2.3 - 15.9 %    Comment: Performed at [facility], [address], [place], [place] [address]  SARS Coronavirus 2 by RT PCR (hospital order, performed in [facility] hospital lab) Nasopharyngeal Nasopharyngeal Swab     Status: None   Collection Time: 04/10/20 12:16 AM   Specimen: Nasopharyngeal Swab  Result Value Ref Range   SARS Coronavirus 2 NEGATIVE NEGATIVE    Comment: (NOTE) SARS-CoV-2 target nucleic acids are NOT DETECTED. The SARS-CoV-2 RNA is generally detectable in upper and lower respiratory specimens during the acute phase of infection. The lowest concentration of SARS-CoV-2 viral copies this assay can detect is 250 copies / mL. A negative result does not preclude SARS-CoV-2 infection and should not be used as the sole basis for treatment or other patient management decisions.  A negative result may occur with improper specimen collection / handling, submission of specimen other than nasopharyngeal swab, presence of viral mutation(s) within the areas targeted by this assay, and inadequate number of viral copies (<250 copies  / mL). A negative result must be combined with clinical observations, patient history, and epidemiological information. Fact Sheet for Patients:   BoilerBrush.com.cy Fact Sheet for Healthcare Providers: https://pope.com/ This test is not yet approved or cleared  by the [place] FDA and has been authorized for detection and/or diagnosis of SARS-CoV-2 by FDA  under an Emergency Use Authorization (EUA).  This EUA will remain in effect (meaning this test can be used) for the duration of the COVID-19 declaration under Section 564(b)(1) of the Act, 21 U.S.C. section 360bbb-3(b)(1), unless the authorization is terminated or revoked sooner. Performed at [facility], [place] [address], [place], [place] [address]   Urinalysis, Routine w reflex microscopic     Status: None   Collection Time: 04/10/20  2:05 AM  Result Value Ref Range   Color, Urine YELLOW YELLOW   APPearance CLEAR CLEAR   Specific Gravity, Urine 1.016 1.005 - 1.030   pH 6.0 5.0 - 8.0   Glucose, UA NEGATIVE NEGATIVE mg/dL   Hgb urine dipstick NEGATIVE NEGATIVE   Bilirubin Urine NEGATIVE NEGATIVE   Ketones, ur NEGATIVE NEGATIVE mg/dL   Protein, ur NEGATIVE NEGATIVE mg/dL   Nitrite NEGATIVE NEGATIVE   Leukocytes,Ua NEGATIVE NEGATIVE    Comment: Performed at [facility], [place] [address], [place], [place] [address]  Troponin I (High Sensitivity)     Status: None   Collection Time: 04/10/20  2:05 AM  Result Value Ref Range   Troponin I (High Sensitivity) 3 <18 ng/L    Comment: (NOTE) Elevated high sensitivity troponin I (hsTnI) values and significant  changes across serial measurements may suggest ACS but many other  chronic and acute conditions are known to elevate hsTnI results.  Refer to the "Links" section for chest pain algorithms and additional  guidance. Performed at [facility], [place] [address] [place],  [place] [address]    DG Chest 2 View  Result Date: 04/10/2020 CLINICAL DATA:  Chest pain EXAM: CHEST - 2 VIEW COMPARISON:  December 21, 2012 FINDINGS: The heart size and mediastinal contours are within normal limits. Both lungs are clear. A left reverse total shoulder arthroplasty is present. No acute osseous abnormality is seen. IMPRESSION: No active cardiopulmonary disease. Electronically Signed   By: Prudencio Pair M.D.   On: 04/10/2020 00:08   CT Angio Chest PE W/Cm &/Or Wo Cm  Result Date: 04/10/2020 CLINICAL DATA:  Left chest pain radiating to shoulder, sickle cell disease EXAM: CT ANGIOGRAPHY CHEST WITH CONTRAST TECHNIQUE: Multidetector CT imaging of the chest was performed using the standard protocol during bolus administration of intravenous contrast. Multiplanar CT image reconstructions and MIPs were obtained to evaluate the vascular anatomy. CONTRAST:  119mL OMNIPAQUE IOHEXOL 350 MG/ML SOLN COMPARISON:  04/10/2020 FINDINGS: Cardiovascular: This is a technically adequate evaluation of the pulmonary vasculature. No filling defects or pulmonary emboli. Dilated main pulmonary artery consistent with pulmonary arterial hypertension. The heart is mildly enlarged without pericardial effusion. Normal appearance of the thoracic aorta without aneurysm or dissection. Mediastinum/Nodes: No enlarged mediastinal, hilar, or axillary lymph nodes. Thyroid gland, trachea, and esophagus demonstrate no significant findings. Lungs/Pleura: No acute airspace disease, effusion, or pneumothorax. Scattered scarring at the lung bases. Central airways are patent. Upper Abdomen: No acute abnormality. Musculoskeletal: Extensive bony changes related to sickle cell disease. Left shoulder arthroplasty. No acute bony abnormalities. Reconstructed images demonstrate no additional findings. Review of the MIP images confirms the above findings. IMPRESSION: 1. No evidence of pulmonary embolus. 2. Dilated main pulmonary artery consistent with  pulmonary arterial hypertension. 3. Chronic changes related to sickle cell disease. No acute intrathoracic process. Electronically Signed   By: Randa Ngo M.D.   On: 04/10/2020 01:43   CT Abdomen Pelvis W Contrast  Result Date: 04/10/2020 CLINICAL DATA:  Acute abdominal pain. History of sickle cell disease.  EXAM: CT ABDOMEN AND PELVIS WITH CONTRAST TECHNIQUE: Multidetector CT imaging of the abdomen and pelvis was performed using the standard protocol following bolus administration of intravenous contrast. CONTRAST:  75mL OMNIPAQUE IOHEXOL 300 MG/ML  SOLN COMPARISON:  04/15/2015 and chest CT, same date FINDINGS: Lower chest: Patchy left basilar atelectasis or early infiltrate/bronchopneumonia. Hepatobiliary: A few tiny scattered hepatic cysts. No worrisome hepatic lesions. Contracted gallbladder with suspected gallstones. Right upper quadrant ultrasound may be helpful for further evaluation. No common bile duct dilatation. Pancreas: No mass, inflammation or ductal dilatation. Incidental note is made of pancreatic divisum. Spleen: Splenomegaly. Spleen measures 14.5 x 14.5 x 9.3 cm. No splenic lesions or evidence of splenic infarct. Adrenals/Urinary Tract: Adrenal glands and kidneys are unremarkable. Contrast noted in the renal collecting systems and bladder from recent contrast enhanced chest CT. Stable left renal cyst. Stomach/Bowel: The stomach, duodenum, small bowel and colon are grossly normal without oral contrast. No acute inflammatory changes, mass lesions or obstructive findings. The terminal ileum is normal. The appendix is normal. Sigmoid colon diverticulosis without findings for acute diverticulitis. Vascular/Lymphatic: The aorta is normal in caliber. No dissection. The branch vessels are patent. The major venous structures are patent. No mesenteric or retroperitoneal mass or adenopathy. Small scattered lymph nodes are noted. Reproductive: The prostate gland and seminal vesicles are unremarkable.  Other: No pelvic mass or adenopathy. No free pelvic fluid collections. No inguinal mass or adenopathy. No abdominal wall hernia or subcutaneous lesions. Musculoskeletal: Osseous changes of sickle cell disease with H-shaped vertebrae and patchy areas of moderate sclerosis. Small focus of chronic appearing AVN involving the right femoral head. Age advanced bilateral hip joint degenerative changes. The right SI joint is largely fused. IMPRESSION: 1. No acute abdominal/pelvic findings, mass lesions or adenopathy. 2. Patchy left basilar atelectasis versus early infiltrate. 3. Contracted gallbladder with suspected gallstones. Right upper quadrant ultrasound may be helpful for further evaluation. 4. Splenomegaly without findings for splenic infarct. 5. Sigmoid colon diverticulosis without findings for acute diverticulitis. 6. Osseous changes of sickle cell disease. Electronically Signed   By: Rudie MeyerP.  Gallerani M.D.   On: 04/10/2020 09:43   MR 3D Recon At Scanner  Result Date: 04/10/2020 CLINICAL DATA:  49 year old male with history of abdominal pain and vomiting in the past 24 hours. History of sickle cell disease. EXAM: MRI ABDOMEN WITHOUT AND WITH CONTRAST (INCLUDING MRCP) TECHNIQUE: Multiplanar multisequence MR imaging of the abdomen was performed both before and after the administration of intravenous contrast. Heavily T2-weighted images of the biliary and pancreatic ducts were obtained, and three-dimensional MRCP images were rendered by post processing. CONTRAST:  7mL GADAVIST GADOBUTROL 1 MMOL/ML IV SOLN COMPARISON:  No prior abdominal MRI. Abdominal ultrasound 04/10/2020. CT the abdomen and pelvis 04/10/2020. FINDINGS: Lower chest: Unremarkable. Hepatobiliary: Subcentimeter T1 hypointense, T2 hyperintense, nonenhancing lesions are noted in the right lobe of the liver, compatible with tiny simple cysts and/or biliary hamartomas. No other suspicious appearing hepatic lesions. No intra or extrahepatic biliary ductal  dilatation. Common bile duct measures 5 mm in the porta hepatis. Small filling defects within the gallbladder, compatible with gallstones. Gallbladder is nearly completely contracted. No pericholecystic fluid or inflammatory changes. No filling defect within the common bile duct to suggest choledocholithiasis. Pancreas: No pancreatic mass. No pancreatic ductal dilatation. No pancreatic or peripancreatic fluid collections or inflammatory changes. Spleen: Spleen is enlarged measuring 13.7 x 9.3 x 15.9 cm (estimated splenic volume of 1,013 mL) . Adrenals/Urinary Tract: Multiple T1 hypointense, T2 hyperintense, nonenhancing lesions are noted in the  kidneys bilaterally, compatible with simple cysts, largest of which is in the interpolar region of the left kidney measuring 2 cm in diameter. No hydroureteronephrosis in the visualized portions of the abdomen. Bilateral adrenal glands are normal in appearance. Stomach/Bowel: Several colonic diverticulae are noted in the sigmoid colon. Vascular/Lymphatic: Aortic atherosclerosis, without evidence of aneurysm or dissection in the abdominal vasculature. No lymphadenopathy noted in the abdomen. Other: No significant volume of ascites noted in the visualized portions of the peritoneal cavity. Musculoskeletal: No aggressive appearing osseous lesions are noted in the visualized portions of the skeleton. IMPRESSION: 1. No acute findings are noted in the abdomen to account for the patient's symptoms. 2. Cholelithiasis without evidence of acute cholecystitis at this time. 3. Splenomegaly. 4. Additional incidental findings, as above. Electronically Signed   By: Trudie Reed M.D.   On: 04/10/2020 14:58   DG Shoulder Left  Result Date: 04/10/2020 CLINICAL DATA:  Pain EXAM: LEFT SHOULDER - 2+ VIEW COMPARISON:  March 18, 2020; CT left shoulder January 24, 2020 FINDINGS: Oblique and Y scapular images were obtained. Patient is status post total shoulder replacement with prosthetic  components well-seated. No fracture or dislocation. 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 There is no appreciable underlying arthropathy. Visualized left lung clear. IMPRESSION: Status post total shoulder replacement on the left with prosthetic components appearing well-seated. No fracture or dislocation. 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 No appreciable arthropathic change. Electronically Signed   By: Bretta Bang III M.D.   On: 04/10/2020 10:45   MR ABDOMEN MRCP W WO CONTAST  Result Date: 04/10/2020 CLINICAL DATA:  49 year old male with history of abdominal pain and vomiting in the past 24 hours. History of sickle cell disease. EXAM: MRI ABDOMEN WITHOUT AND WITH CONTRAST (INCLUDING MRCP) TECHNIQUE: Multiplanar multisequence MR imaging of the abdomen was performed both before and after the administration of intravenous contrast. Heavily T2-weighted images of the biliary and pancreatic ducts were obtained, and three-dimensional MRCP images were rendered by post processing. CONTRAST:  7mL GADAVIST GADOBUTROL 1 MMOL/ML IV SOLN COMPARISON:  No prior abdominal MRI. Abdominal ultrasound 04/10/2020. CT the abdomen and pelvis 04/10/2020. FINDINGS: Lower chest: Unremarkable. Hepatobiliary: Subcentimeter T1 hypointense, T2 hyperintense, nonenhancing lesions are noted in the right lobe of the liver, compatible with tiny simple cysts and/or biliary hamartomas. No other suspicious appearing hepatic lesions. No intra or extrahepatic biliary ductal dilatation. Common bile duct measures 5 mm in the porta hepatis. Small filling defects within the gallbladder, compatible with gallstones. Gallbladder is nearly completely contracted. No pericholecystic fluid or inflammatory changes. No filling defect within the common bile duct to suggest choledocholithiasis. Pancreas: No pancreatic mass. No pancreatic ductal dilatation. No pancreatic or peripancreatic fluid  collections or inflammatory changes. Spleen: Spleen is enlarged measuring 13.7 x 9.3 x 15.9 cm (estimated splenic volume of 1,013 mL) . Adrenals/Urinary Tract: Multiple T1 hypointense, T2 hyperintense, nonenhancing lesions are noted in the kidneys bilaterally, compatible with simple cysts, largest of which is in the interpolar region of the left kidney measuring 2 cm in diameter. No hydroureteronephrosis in the visualized portions of the abdomen. Bilateral adrenal glands are normal in appearance. Stomach/Bowel: Several colonic diverticulae are noted in the sigmoid colon. Vascular/Lymphatic: Aortic atherosclerosis, without evidence of aneurysm or dissection in the abdominal vasculature. No lymphadenopathy noted in the abdomen. Other: No significant volume of ascites noted in the visualized portions of the peritoneal cavity. Musculoskeletal: No aggressive appearing osseous lesions are noted in the visualized portions  of the skeleton. IMPRESSION: 1. No acute findings are noted in the abdomen to account for the patient's symptoms. 2. Cholelithiasis without evidence of acute cholecystitis at this time. 3. Splenomegaly. 4. Additional incidental findings, as above. Electronically Signed   By: Trudie Reed M.D.   On: 04/10/2020 14:58   US Abdomen Limited RUQ  Result Date: 04/10/2020 CLINICAL DATA:  Upper abdominal pain EXAM: ULTRASOUND ABDOMEN LIMITED RIGHT UPPER QUADRANT COMPARISON:  CT abdomen and pelvis Apr 10, 2020 FINDINGS: Gallbladder: Gallbladder is contracted. There are multiple echogenic foci in the gallbladder consistent with cholelithiasis. Largest gallstone measures 6 mm in length. No appreciable gallbladder wall thickening or pericholecystic fluid. No sonographic Murphy sign noted by sonographer. Common bile duct: Diameter: 7 mm, upper normal in size. There is slight intrahepatic biliary duct dilatation. Liver: No focal lesion identified. Within normal limits in parenchymal echogenicity. Portal vein is  patent on color Doppler imaging with normal direction of blood flow towards the liver. Other: None. IMPRESSION: Gallbladder contracted with cholelithiasis. No appreciable gallbladder wall thickening or pericholecystic fluid. Slight intrahepatic biliary duct dilatation. Common bile duct upper normal in size. No obstructing focus seen by ultrasound in the biliary ductal system. If further evaluation of the biliary ductal system is felt to be warranted, MRCP would be the optimum imaging study of choice for further assessment in this regard. Electronically Signed   By: Bretta Bang III M.D.   On: 04/10/2020 10:48    Pending Labs Unresulted Labs (From admission, onward)    Start     Ordered   04/10/20 1519  HIV Antibody (routine testing w rflx)  (HIV Antibody (Routine testing w reflex) panel)  Once,   STAT     04/10/20 1518   04/10/20 0525  Culture, blood (routine x 2)  BLOOD CULTURE X 2,   STAT     04/10/20 0524   Signed and Held  Comprehensive metabolic panel  Tomorrow morning,   R     Signed and Held   Signed and Held  CBC with Differential/Platelet  Tomorrow morning,   R     Signed and Held          Vitals/Pain Today's Vitals   04/10/20 1300 04/10/20 1433 04/10/20 1441 04/10/20 1500  BP: 109/74 131/80  113/71  Pulse: 74 83  83  Resp: Temp:      TempSrc:      SpO2: 99% 99%  97%  Weight:      Height:      PainSc:   7      Isolation Precautions No active isolations  Medications Medications  0.45 % sodium chloride infusion ( Intravenous Stopped 04/10/20 1240)  sodium chloride (PF) 0.9 % injection (has no administration in time range)  sodium chloride (PF) 0.9 % injection (has no administration in time range)  methocarbamol (ROBAXIN) tablet 500 mg (has no administration in time range)  enoxaparin (LOVENOX) injection 40 mg (40 mg Subcutaneous Given 04/10/20 1604)  folic acid (FOLVITE) tablet 1 mg (1 mg Oral Given 04/10/20 1602)  omega-3 acid ethyl esters (LOVAZA)  capsule 1 g (has no administration in time range)  zinc sulfate capsule 220 mg (220 mg Oral Given 04/10/20 1602)  naloxone (NARCAN) injection 0.4 mg (has no administration in time range)    And  sodium chloride flush (NS) 0.9 % injection 9 mL (has no administration in time range)  ondansetron (ZOFRAN) injection 4 mg (has no administration in time range)  diphenhydrAMINE (BENADRYL) capsule 25 mg (has no administration in time range)    Or  diphenhydrAMINE (BENADRYL) 25 mg in sodium chloride 0.9 % 50 mL IVPB (has no administration in time range)  HYDROmorphone (DILAUDID) 1 mg/mL PCA injection (has no administration in time range)  senna-docusate (Senokot-S) tablet 1 tablet (1 tablet Oral Given 04/10/20 1602)  polyethylene glycol (MIRALAX / GLYCOLAX) packet 17 g (has no administration in time range)  ketorolac (TORADOL) 15 MG/ML injection 15 mg (has no administration in time range)  sodium chloride flush (NS) 0.9 % injection 3 mL (3 mLs Intravenous Given 04/10/20 0010)  acetaminophen (TYLENOL) tablet 650 mg (650 mg Oral Given 04/10/20 0010)  HYDROmorphone (DILAUDID) injection 2 mg (2 mg Intravenous Given 04/10/20 0016)  HYDROmorphone (DILAUDID) injection 2 mg (2 mg Intravenous Given 04/10/20 0102)  ketorolac (TORADOL) 15 MG/ML injection 15 mg (15 mg Intravenous Given 04/10/20 0011)  ondansetron (ZOFRAN-ODT) disintegrating tablet 8 mg (8 mg Oral Given 04/10/20 0009)  iohexol (OMNIPAQUE) 350 MG/ML injection 100 mL (100 mLs Intravenous Contrast Given 04/10/20 0129)  oxyCODONE (Oxy IR/ROXICODONE) immediate release tablet 15 mg (15 mg Oral Given 04/10/20 0447)  iohexol (OMNIPAQUE) 300 MG/ML solution 75 mL (75 mLs Intravenous Contrast Given 04/10/20 0513)  ceFEPIme (MAXIPIME) 2 g in sodium chloride 0.9 % 100 mL IVPB (0 g Intravenous Stopped 04/10/20 0846)  metroNIDAZOLE (FLAGYL) IVPB 500 mg (0 mg Intravenous Stopped 04/10/20 1029)  vancomycin (VANCOCIN) IVPB 1000 mg/200 mL premix (0 mg Intravenous Stopped 04/10/20  1240)  HYDROmorphone (DILAUDID) injection 1 mg (1 mg Intravenous Given 04/10/20 0816)  HYDROmorphone (DILAUDID) injection 1 mg (1 mg Intravenous Given 04/10/20 1240)  gadobutrol (GADAVIST) 1 MMOL/ML injection 7 mL (7 mLs Intravenous Contrast Given 04/10/20 1415)    Mobility walks

## 2020-04-10 NOTE — ED Notes (Signed)
Patient asking can he have something to eat or drink at this time; informed patient that we must wait on results prior to him eating or drinking. Wife now at bedside with patient. Patient also inquiring about bed status, told patient that at this time, no bed available.

## 2020-04-10 NOTE — Progress Notes (Signed)
[facility] consult received to draw blood from patient who is a very difficult stick. ER nurse attempted and then called lab, who directed her to contact IV team. Called pt's nurse, Shanda Bumps and informed her that IV team does not draw blood unless it is with an IV start and the IV is necessary. Currently this patient has a working IV.  Called lab and advised that IV team does not draw blood unless it is with an IV start and the IV is necessary. Currently this patient has a working IV, so lab will need to assess and contact physician if unable to collect bloodwork.

## 2020-04-10 NOTE — ED Notes (Signed)
Transported called to take pt to assigned room.

## 2020-04-10 NOTE — ED Provider Notes (Signed)
Care handoff received from [facility] PA-C at shift change. Please see previous provider's note for full details.  "49 year old male with a history of sickle cell anemia presents the ED today with complaint of chest pain, left shoulder pain, abdominal pain, nausea, vomiting.  Arrival to the ED patient is febrile with a temp of 100.5.  Mildly tachycardic in the low 100s.  Nontachypneic.  Satting 100% on room air.  That he will typically have pain in his chest.  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  Currently taking Lovenox at home has not missed any doses.  Sickle cell anemia there is concern for PE.  Will need CTA at this time.  Will obtain chest x-ray to rule out any acute chest syndrome as well.  OB team currently at bedside trying to get access.  Will give Zofran ODT as patient is actively retching in the room.  He has mild periumbilical abdominal tenderness however states it is mostly his chest pain that brought him into the ED today.  Will provide fluids as well.  Patient will likely need admission.  CXR negative.  CBC without leukocytosis. Hgb 10.4 CMP with glucose 125. No other abnormality Lipase 27  COVID test negative CTA negative for PE.   On reevaluation pt with more abdominal TTP than prior. Will add on CT A/P at this time. His fever has been controlled with tylenol and he is eating and drinking after zofran and fluids however given sickle cell pt with fever feel he needs admission. Will order abx to cover for unknown infection source.   While pt was being taken to CT scan his IV infiltrated; unable to get CT A/P at this time. Awaiting IV team consult to establish line again.   Discussed case with Dr. Thomes Dinning who will evaluate patient for admission pending CT scan. "  At shift change plan of care is to follow-up on CT abdomen pelvis and reconsult hospitalist team for admission.  Physical Exam  BP 117/88   Pulse 84   Temp 98.7 F  (37.1 C) (Oral)   Resp 14   Ht 5\' 5"  (1.651 m)   Wt 70.3 kg   SpO2 100%   BMI 25.79 kg/m   Physical Exam Constitutional:      General: He is not in acute distress.    Appearance: Normal appearance. He is well-developed. He is not ill-appearing or diaphoretic.  HENT:     Head: Normocephalic and atraumatic.     Right Ear: External ear normal.     Left Ear: External ear normal.     Nose: Nose normal.  Eyes:     General: Vision grossly intact. Gaze aligned appropriately.     Pupils: Pupils are equal, round, and reactive to light.  Neck:     Trachea: Trachea and phonation normal. No tracheal deviation.  Pulmonary:     Effort: Pulmonary effort is normal. No respiratory distress.  Skin:    General: Skin is warm and dry.  Neurological:     Mental Status: He is alert.     GCS: GCS eye subscore is 4. GCS verbal subscore is 5. GCS motor subscore is 6.     Comments: Speech is clear and goal oriented, follows commands Major Cranial nerves without deficit, no facial droop Moves extremities without ataxia, coordination intact  Psychiatric:        Behavior: Behavior normal.     ED Course/Procedures   Clinical Course as  of Apr 10 1132  Fri Apr 10, 2020  1127 Smith Robert   [BM]    Clinical Course User Index [BM] Deliah Boston, PA-C    Procedures  MDM  8:05 AM: Patient reevaluated is resting in bed no acute distress family member at bedside.  Reports pain has somewhat returned requesting additional pain medication which will be ordered.  Still awaiting CT abdomen pelvis will reassess. ------------- CTAP:    IMPRESSION:  1. No acute abdominal/pelvic findings, mass lesions or adenopathy.  2. Patchy left basilar atelectasis versus early infiltrate.  3. Contracted gallbladder with suspected gallstones. Right upper  quadrant ultrasound may be helpful for further evaluation.  4. Splenomegaly without findings for splenic infarct.  5. Sigmoid colon diverticulosis without findings  for acute  diverticulitis.  6. Osseous changes of sickle cell disease.   Patient reexamined resting comfortably no acute distress.  Vital signs stable.  Right upper quadrant ultrasound ordered.  Suspect atelectasis at this time, no evidence of pneumonia on previous CTA PE study.  Patient has received cefepime, vancomycin and Flagyl. - RUQ [place]:  IMPRESSION:  Gallbladder contracted with cholelithiasis. No appreciable  gallbladder wall thickening or pericholecystic fluid.    Slight intrahepatic biliary duct dilatation. Common bile duct upper  normal in size. No obstructing focus seen by ultrasound in the  biliary ductal system. If further evaluation of the biliary ductal  system is felt to be warranted, MRCP would be the optimum imaging  study of choice for further assessment in this regard.   Patient reexamined, he has no right upper quadrant tenderness on exam reports he is feeling well.  Total bilirubin 1.5 otherwise LFTs within normal limits. Lipase WNL.  Will consult hospitalist for admission.  Will discuss possibility of future MRCP with hospitalist.  - 11:27 AM: Discussed case with Smith Robert NP for sickle cell service.  Plan of care is for sickle cell team to admit the patient, will order MRCP while in the ER.  I reassessed the patient is resting comfortably watching TV no acute distress he has no complaints or concerns at this time. ------ 12:15 PM: Patient reevaluated resting comfortably no acute distress requesting additional pain medication as well as food.  He is aware of need for MRCP, will keep n.p.o. until this results.  Additional Dilaudid has been ordered.  Patient awaiting admission.   Note: Portions of this report may have been transcribed using voice recognition software. Every effort was made to ensure accuracy; however, inadvertent computerized transcription errors may still be present.   Gari Crown 04/10/20 1304    Lacretia Leigh, MD 04/12/20 1214

## 2020-04-10 NOTE — ED Notes (Signed)
Patient transported to CT 

## 2020-04-10 NOTE — Progress Notes (Addendum)
[facility] consulted to obtain IV access after original access infiltrated in CT. Attempt to call pt's nurse unsuccessful. Sent SecureChat to [place], pt's nurse regarding consult as it appears pt has an IV in place. [facility] canceling consult at this time. Pt's RN advised to contact [facility] if further access needed.

## 2020-04-10 NOTE — Progress Notes (Signed)
Orthopedic Tech Progress Note Patient Details:  [name] February 09, 1971 [id number]  Ortho Devices Type of Ortho Device: Arm sling Ortho Device/Splint Location: LUE Ortho Device/Splint Interventions: Ordered, Application   Post Interventions Patient Tolerated: Well Instructions Provided: Care of device, Adjustment of device   [name] 04/10/2020, 4:28 PM

## 2020-04-10 NOTE — ED Notes (Signed)
Ultrasound now at bedside.  

## 2020-04-11 DIAGNOSIS — D5709 Hb-ss disease with crisis with other specified complication: Secondary | ICD-10-CM

## 2020-04-11 DIAGNOSIS — D5700 Hb-SS disease with crisis, unspecified: Secondary | ICD-10-CM

## 2020-04-11 DIAGNOSIS — R112 Nausea with vomiting, unspecified: Secondary | ICD-10-CM

## 2020-04-11 DIAGNOSIS — K8020 Calculus of gallbladder without cholecystitis without obstruction: Secondary | ICD-10-CM

## 2020-04-11 DIAGNOSIS — Z09 Encounter for follow-up examination after completed treatment for conditions other than malignant neoplasm: Secondary | ICD-10-CM

## 2020-04-11 DIAGNOSIS — D571 Sickle-cell disease without crisis: Secondary | ICD-10-CM

## 2020-04-11 DIAGNOSIS — R1084 Generalized abdominal pain: Secondary | ICD-10-CM

## 2020-04-11 DIAGNOSIS — R509 Fever, unspecified: Secondary | ICD-10-CM

## 2020-04-11 DIAGNOSIS — R1110 Vomiting, unspecified: Secondary | ICD-10-CM

## 2020-04-11 LAB — HIV ANTIBODY (ROUTINE TESTING W REFLEX): HIV Screen 4th Generation wRfx: NONREACTIVE

## 2020-04-11 LAB — COMPREHENSIVE METABOLIC PANEL
ALT: 18 U/L (ref 0–44)
AST: 16 U/L (ref 15–41)
Albumin: 3.3 g/dL — ABNORMAL LOW (ref 3.5–5.0)
Alkaline Phosphatase: 43 U/L (ref 38–126)
Anion gap: 5 (ref 5–15)
BUN: 16 mg/dL (ref 6–20)
CO2: 24 mmol/L (ref 22–32)
Calcium: 8.6 mg/dL — ABNORMAL LOW (ref 8.9–10.3)
Chloride: 104 mmol/L (ref 98–111)
Creatinine, Ser: 0.97 mg/dL (ref 0.61–1.24)
GFR calc Af Amer: >60 mL/min (ref >60)
GFR calc non Af Amer: >60 mL/min (ref >60)
Glucose, Bld: 96 mg/dL (ref 70–99)
Potassium: 4.1 mmol/L (ref 3.5–5.1)
Sodium: 133 mmol/L — ABNORMAL LOW (ref 135–145)
Total Bilirubin: 0.9 mg/dL (ref 0.3–1.2)
Total Protein: 6.3 g/dL — ABNORMAL LOW (ref 6.5–8.1)

## 2020-04-11 LAB — CBC WITH DIFFERENTIAL/PLATELET
Abs Immature Granulocytes: 0.02 10*3/uL (ref 0.00–0.07)
Basophils Absolute: 0 10*3/uL (ref 0.0–0.1)
Basophils Relative: 1 %
Eosinophils Absolute: 0.1 10*3/uL (ref 0.0–0.5)
Eosinophils Relative: 4 %
HCT: 24.5 % — ABNORMAL LOW (ref 39.0–52.0)
Hemoglobin: 8 g/dL — ABNORMAL LOW (ref 13.0–17.0)
Immature Granulocytes: 1 %
Lymphocytes Relative: 25 %
Lymphs Abs: 0.9 10*3/uL (ref 0.7–4.0)
MCH: 22.6 pg — ABNORMAL LOW (ref 26.0–34.0)
MCHC: 32.7 g/dL (ref 30.0–36.0)
MCV: 69.2 fL — ABNORMAL LOW (ref 80.0–100.0)
Monocytes Absolute: 0.3 10*3/uL (ref 0.1–1.0)
Monocytes Relative: 8 %
Neutro Abs: 2.3 10*3/uL (ref 1.7–7.7)
Neutrophils Relative %: 61 %
Platelets: 198 10*3/uL (ref 150–400)
RBC: 3.54 MIL/uL — ABNORMAL LOW (ref 4.22–5.81)
RDW: 26 % — ABNORMAL HIGH (ref 11.5–15.5)
WBC: 3.7 10*3/uL — ABNORMAL LOW (ref 4.0–10.5)
nRBC: 0 % (ref 0.0–0.2)

## 2020-04-11 MED ORDER — HYDROMORPHONE HCL 1 MG/ML IJ SOLN
1.0000 mg | INTRAMUSCULAR | Status: DC | PRN
  Administered 2020-04-11 (×2): 1 mg via INTRAVENOUS
  Filled 2020-04-11 (×2): qty 1

## 2020-04-11 NOTE — Progress Notes (Signed)
Patient ID: Leonard Rubinmmanuel Wivell, male   DOB: 1971-08-14, 49 y.o.   MRN: [id number] Subjective: [name]  is a 49 y.o. male with a medical history significant for sickle cell disease type Wilson, avascular necrosis of left femoral head, history of gastritis, history of osteoarthritis, history of tobacco dependence, and history of anemia of chronic disease who was admitted for sickle cell pain crisis.  Today his pain is much improved but still not at baseline.  Pain is mostly in his left shoulder joint and left forearm, currently at 6/10 characterized as throbbing and achy.  No more fever.  No more vomiting or nausea.  Patient is tolerating p.o. intake with no restrictions.  Patient denies any fever, chills, shortness of breath, cough, urinary symptoms, nausea, vomiting or diarrhea.  Objective:  Vital signs in last 24 hours:  Vitals:   04/11/20 0545 04/11/20 0602 04/11/20 0813 04/11/20 0929  BP: 117/70   130/65  Pulse: 71   82  Resp: 16 16 14 18   Temp: 97.9 F (36.6 C)   98.2 F (36.8 C)  TempSrc: Oral   Oral  SpO2: 100% 98% 100% 99%  Weight:      Height:        Intake/Output from previous day:   Intake/Output Summary (Last 24 hours) at 04/11/2020 1213 Last data filed at 04/11/2020 1018 Gross per 24 hour  Intake 2241.67 ml  Output 500 ml  Net 1741.67 ml    Physical Exam: General: Alert, awake, oriented x3, in no acute distress.  Left arm in sling. HEENT: [place]/AT PEERL, EOMI Neck: Trachea midline,  no masses, no thyromegal,y no JVD, no carotid bruit OROPHARYNX:  Moist, No exudate/ erythema/lesions.  Heart: Regular rate and rhythm, without murmurs, rubs, gallops, PMI non-displaced, no heaves or thrills on palpation.  Lungs: Clear to auscultation, no wheezing or rhonchi noted. No increased vocal fremitus resonant to percussion  Abdomen: Soft, nontender, nondistended, positive bowel sounds, no masses no hepatosplenomegaly noted..  Neuro: No focal neurological deficits noted cranial  nerves II through XII grossly intact. DTRs 2+ bilaterally upper and lower extremities. Strength 5 out of 5 in bilateral upper and lower extremities. Musculoskeletal: Well-healed left shoulder arthroplasty scar.  No warm swelling or erythema around joints, no spinal tenderness noted. Psychiatric: Patient alert and oriented x3, good insight and cognition, good recent to remote recall. Lymph node survey: No cervical axillary or inguinal lymphadenopathy noted.  Lab Results:  Basic Metabolic Panel:    Component Value Date/Time   NA 133 (L) 04/11/2020 0722   NA 140 06/21/2012 0000   K 4.1 04/11/2020 0722   CL 104 04/11/2020 0722   CO2 24 04/11/2020 0722   BUN 16 04/11/2020 0722   BUN 12 06/21/2012 0000   CREATININE 0.97 04/11/2020 0722   GLUCOSE 96 04/11/2020 0722   CALCIUM 8.6 (L) 04/11/2020 0722   CBC:    Component Value Date/Time   WBC 3.7 (L) 04/11/2020 0722   HGB 8.0 (L) 04/11/2020 0722   HCT 24.5 (L) 04/11/2020 0722   PLT 198 04/11/2020 0722   MCV 69.2 (L) 04/11/2020 0722   NEUTROABS 2.3 04/11/2020 0722   LYMPHSABS 0.9 04/11/2020 0722   MONOABS 0.3 04/11/2020 0722   EOSABS 0.1 04/11/2020 0722   BASOSABS 0.0 04/11/2020 0722    Recent Results (from the past 240 hour(s))  SARS Coronavirus 2 by RT PCR (hospital order, performed in [facility] hospital lab) Nasopharyngeal Nasopharyngeal Swab     Status: None   Collection Time: 04/10/20  12:16 AM   Specimen: Nasopharyngeal Swab  Result Value Ref Range Status   SARS Coronavirus 2 NEGATIVE NEGATIVE Final    Comment: (NOTE) SARS-CoV-2 target nucleic acids are NOT DETECTED. The SARS-CoV-2 RNA is generally detectable in upper and lower respiratory specimens during the acute phase of infection. The lowest concentration of SARS-CoV-2 viral copies this assay can detect is 250 copies / mL. A negative result does not preclude SARS-CoV-2 infection and should not be used as the sole basis for treatment or other patient management  decisions.  A negative result may occur with improper specimen collection / handling, submission of specimen other than nasopharyngeal swab, presence of viral mutation(s) within the areas targeted by this assay, and inadequate number of viral copies (<250 copies / mL). A negative result must be combined with clinical observations, patient history, and epidemiological information. Fact Sheet for Patients:   BoilerBrush.com.cy Fact Sheet for Healthcare Providers: https://pope.com/ This test is not yet approved or cleared  by the [place] FDA and has been authorized for detection and/or diagnosis of SARS-CoV-2 by FDA under an Emergency Use Authorization (EUA).  This EUA will remain in effect (meaning this test can be used) for the duration of the COVID-19 declaration under Section 564(b)(1) of the Act, 21 U.S.C. section 360bbb-3(b)(1), unless the authorization is terminated or revoked sooner. Performed at [facility], [address] [place], [place] [address]   Culture, blood (routine x 2)     Status: None (Preliminary result)   Collection Time: 04/10/20  7:34 AM   Specimen: BLOOD  Result Value Ref Range Status   Specimen Description   Final    BLOOD BLOOD RIGHT FOREARM Performed at [facility], [address] [place], [place] [address]    Special Requests   Final    BOTTLES DRAWN AEROBIC AND ANAEROBIC Blood Culture results may not be optimal due to an excessive volume of blood received in culture bottles Performed at [facility], [address], [place], [place] [address]    Culture   Final    NO GROWTH < 24 HOURS Performed at [facility], [address] [place], [place] [address]    Report Status PENDING  Incomplete    Studies/Results: DG Chest 2 View  Result Date: 04/10/2020 CLINICAL DATA:  Chest pain EXAM: CHEST - 2 VIEW COMPARISON:  December 21, 2012  FINDINGS: The heart size and mediastinal contours are within normal limits. Both lungs are clear. A left reverse total shoulder arthroplasty is present. No acute osseous abnormality is seen. IMPRESSION: No active cardiopulmonary disease. Electronically Signed   By: Jonna Clark M.D.   On: 04/10/2020 00:08   CT Angio Chest PE W/Cm &/Or Wo Cm  Result Date: 04/10/2020 CLINICAL DATA:  Left chest pain radiating to shoulder, sickle cell disease EXAM: CT ANGIOGRAPHY CHEST WITH CONTRAST TECHNIQUE: Multidetector CT imaging of the chest was performed using the standard protocol during bolus administration of intravenous contrast. Multiplanar CT image reconstructions and MIPs were obtained to evaluate the vascular anatomy. CONTRAST:  OMNIPAQUE IOHEXOL 350 MG/ML SOLN COMPARISON:  04/10/2020 FINDINGS: Cardiovascular: This is a technically adequate evaluation of the pulmonary vasculature. No filling defects or pulmonary emboli. Dilated main pulmonary artery consistent with pulmonary arterial hypertension. The heart is mildly enlarged without pericardial effusion. Normal appearance of the thoracic aorta without aneurysm or dissection. Mediastinum/Nodes: No enlarged mediastinal, hilar, or axillary lymph nodes. Thyroid gland, trachea, and esophagus demonstrate no significant findings. Lungs/Pleura:  No acute airspace disease, effusion, or pneumothorax. Scattered scarring at the lung bases. Central airways are patent. Upper Abdomen: No acute abnormality. Musculoskeletal: Extensive bony changes related to sickle cell disease. Left shoulder arthroplasty. No acute bony abnormalities. Reconstructed images demonstrate no additional findings. Review of the MIP images confirms the above findings. IMPRESSION: 1. No evidence of pulmonary embolus. 2. Dilated main pulmonary artery consistent with pulmonary arterial hypertension. 3. Chronic changes related to sickle cell disease. No acute intrathoracic process. Electronically Signed    By: Sharlet Salina M.D.   On: 04/10/2020 01:43   CT Abdomen Pelvis W Contrast  Result Date: 04/10/2020 CLINICAL DATA:  Acute abdominal pain. History of sickle cell disease. EXAM: CT ABDOMEN AND PELVIS WITH CONTRAST TECHNIQUE: Multidetector CT imaging of the abdomen and pelvis was performed using the standard protocol following bolus administration of intravenous contrast. CONTRAST:  28mL OMNIPAQUE IOHEXOL 300 MG/ML  SOLN COMPARISON:  04/15/2015 and chest CT, same date FINDINGS: Lower chest: Patchy left basilar atelectasis or early infiltrate/bronchopneumonia. Hepatobiliary: A few tiny scattered hepatic cysts. No worrisome hepatic lesions. Contracted gallbladder with suspected gallstones. Right upper quadrant ultrasound may be helpful for further evaluation. No common bile duct dilatation. Pancreas: No mass, inflammation or ductal dilatation. Incidental note is made of pancreatic divisum. Spleen: Splenomegaly. Spleen measures 14.5 x 14.5 x 9.3 cm. No splenic lesions or evidence of splenic infarct. Adrenals/Urinary Tract: Adrenal glands and kidneys are unremarkable. Contrast noted in the renal collecting systems and bladder from recent contrast enhanced chest CT. Stable left renal cyst. Stomach/Bowel: The stomach, duodenum, small bowel and colon are grossly normal without oral contrast. No acute inflammatory changes, mass lesions or obstructive findings. The terminal ileum is normal. The appendix is normal. Sigmoid colon diverticulosis without findings for acute diverticulitis. Vascular/Lymphatic: The aorta is normal in caliber. No dissection. The branch vessels are patent. The major venous structures are patent. No mesenteric or retroperitoneal mass or adenopathy. Small scattered lymph nodes are noted. Reproductive: The prostate gland and seminal vesicles are unremarkable. Other: No pelvic mass or adenopathy. No free pelvic fluid collections. No inguinal mass or adenopathy. No abdominal wall hernia or  subcutaneous lesions. Musculoskeletal: Osseous changes of sickle cell disease with H-shaped vertebrae and patchy areas of moderate sclerosis. Small focus of chronic appearing AVN involving the right femoral head. Age advanced bilateral hip joint degenerative changes. The right SI joint is largely fused. IMPRESSION: 1. No acute abdominal/pelvic findings, mass lesions or adenopathy. 2. Patchy left basilar atelectasis versus early infiltrate. 3. Contracted gallbladder with suspected gallstones. Right upper quadrant ultrasound may be helpful for further evaluation. 4. Splenomegaly without findings for splenic infarct. 5. Sigmoid colon diverticulosis without findings for acute diverticulitis. 6. Osseous changes of sickle cell disease. Electronically Signed   By: Rudie Meyer M.D.   On: 04/10/2020 09:43   MR 3D Recon At Scanner  Result Date: 04/10/2020 CLINICAL DATA:  49 year old male with history of abdominal pain and vomiting in the past 24 hours. History of sickle cell disease. EXAM: MRI ABDOMEN WITHOUT AND WITH CONTRAST (INCLUDING MRCP) TECHNIQUE: Multiplanar multisequence MR imaging of the abdomen was performed both before and after the administration of intravenous contrast. Heavily T2-weighted images of the biliary and pancreatic ducts were obtained, and three-dimensional MRCP images were rendered by post processing. CONTRAST:  44mL GADAVIST GADOBUTROL 1 MMOL/ML IV SOLN COMPARISON:  No prior abdominal MRI. Abdominal ultrasound 04/10/2020. CT the abdomen and pelvis 04/10/2020. FINDINGS: Lower chest: Unremarkable. Hepatobiliary: Subcentimeter T1 hypointense, T2 hyperintense, nonenhancing lesions are  noted in the right lobe of the liver, compatible with tiny simple cysts and/or biliary hamartomas. No other suspicious appearing hepatic lesions. No intra or extrahepatic biliary ductal dilatation. Common bile duct measures 5 mm in the porta hepatis. Small filling defects within the gallbladder, compatible with  gallstones. Gallbladder is nearly completely contracted. No pericholecystic fluid or inflammatory changes. No filling defect within the common bile duct to suggest choledocholithiasis. Pancreas: No pancreatic mass. No pancreatic ductal dilatation. No pancreatic or peripancreatic fluid collections or inflammatory changes. Spleen: Spleen is enlarged measuring 13.7 x 9.3 x 15.9 cm (estimated splenic volume of 1,013 mL) . Adrenals/Urinary Tract: Multiple T1 hypointense, T2 hyperintense, nonenhancing lesions are noted in the kidneys bilaterally, compatible with simple cysts, largest of which is in the interpolar region of the left kidney measuring 2 cm in diameter. No hydroureteronephrosis in the visualized portions of the abdomen. Bilateral adrenal glands are normal in appearance. Stomach/Bowel: Several colonic diverticulae are noted in the sigmoid colon. Vascular/Lymphatic: Aortic atherosclerosis, without evidence of aneurysm or dissection in the abdominal vasculature. No lymphadenopathy noted in the abdomen. Other: No significant volume of ascites noted in the visualized portions of the peritoneal cavity. Musculoskeletal: No aggressive appearing osseous lesions are noted in the visualized portions of the skeleton. IMPRESSION: 1. No acute findings are noted in the abdomen to account for the patient's symptoms. 2. Cholelithiasis without evidence of acute cholecystitis at this time. 3. Splenomegaly. 4. Additional incidental findings, as above. Electronically Signed   By: Vinnie Langton M.D.   On: 04/10/2020 14:58   DG Shoulder Left  Result Date: 04/10/2020 CLINICAL DATA:  Pain EXAM: LEFT SHOULDER - 2+ VIEW COMPARISON:  March 18, 2020; CT left shoulder January 24, 2020 FINDINGS: Oblique and Y scapular images were obtained. Patient is status post total shoulder replacement with prosthetic components well-seated. No fracture or dislocation. 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  There is no appreciable underlying arthropathy. Visualized left lung clear. IMPRESSION: Status post total shoulder replacement on the left with prosthetic components appearing well-seated. No fracture or dislocation. 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 No appreciable arthropathic change. Electronically Signed   By: Lowella Grip III M.D.   On: 04/10/2020 10:45   MR ABDOMEN MRCP W WO CONTAST  Result Date: 04/10/2020 CLINICAL DATA:  49 year old male with history of abdominal pain and vomiting in the past 24 hours. History of sickle cell disease. EXAM: MRI ABDOMEN WITHOUT AND WITH CONTRAST (INCLUDING MRCP) TECHNIQUE: Multiplanar multisequence MR imaging of the abdomen was performed both before and after the administration of intravenous contrast. Heavily T2-weighted images of the biliary and pancreatic ducts were obtained, and three-dimensional MRCP images were rendered by post processing. CONTRAST:  62mL GADAVIST GADOBUTROL 1 MMOL/ML IV SOLN COMPARISON:  No prior abdominal MRI. Abdominal ultrasound 04/10/2020. CT the abdomen and pelvis 04/10/2020. FINDINGS: Lower chest: Unremarkable. Hepatobiliary: Subcentimeter T1 hypointense, T2 hyperintense, nonenhancing lesions are noted in the right lobe of the liver, compatible with tiny simple cysts and/or biliary hamartomas. No other suspicious appearing hepatic lesions. No intra or extrahepatic biliary ductal dilatation. Common bile duct measures 5 mm in the porta hepatis. Small filling defects within the gallbladder, compatible with gallstones. Gallbladder is nearly completely contracted. No pericholecystic fluid or inflammatory changes. No filling defect within the common bile duct to suggest choledocholithiasis. Pancreas: No pancreatic mass. No pancreatic ductal dilatation. No pancreatic or peripancreatic fluid collections or inflammatory changes. Spleen: Spleen is enlarged measuring 13.7 x 9.3  x 15.9 cm (estimated splenic volume of  1,013 mL) . Adrenals/Urinary Tract: Multiple T1 hypointense, T2 hyperintense, nonenhancing lesions are noted in the kidneys bilaterally, compatible with simple cysts, largest of which is in the interpolar region of the left kidney measuring 2 cm in diameter. No hydroureteronephrosis in the visualized portions of the abdomen. Bilateral adrenal glands are normal in appearance. Stomach/Bowel: Several colonic diverticulae are noted in the sigmoid colon. Vascular/Lymphatic: Aortic atherosclerosis, without evidence of aneurysm or dissection in the abdominal vasculature. No lymphadenopathy noted in the abdomen. Other: No significant volume of ascites noted in the visualized portions of the peritoneal cavity. Musculoskeletal: No aggressive appearing osseous lesions are noted in the visualized portions of the skeleton. IMPRESSION: 1. No acute findings are noted in the abdomen to account for the patient's symptoms. 2. Cholelithiasis without evidence of acute cholecystitis at this time. 3. Splenomegaly. 4. Additional incidental findings, as above. Electronically Signed   By: Trudie Reed M.D.   On: 04/10/2020 14:58   US Abdomen Limited RUQ  Result Date: 04/10/2020 CLINICAL DATA:  Upper abdominal pain EXAM: ULTRASOUND ABDOMEN LIMITED RIGHT UPPER QUADRANT COMPARISON:  CT abdomen and pelvis Apr 10, 2020 FINDINGS: Gallbladder: Gallbladder is contracted. There are multiple echogenic foci in the gallbladder consistent with cholelithiasis. Largest gallstone measures 6 mm in length. No appreciable gallbladder wall thickening or pericholecystic fluid. No sonographic Murphy sign noted by sonographer. Common bile duct: Diameter: 7 mm, upper normal in size. There is slight intrahepatic biliary duct dilatation. Liver: No focal lesion identified. Within normal limits in parenchymal echogenicity. Portal vein is patent on color Doppler imaging with normal direction of blood flow towards the liver. Other: None. IMPRESSION: Gallbladder  contracted with cholelithiasis. No appreciable gallbladder wall thickening or pericholecystic fluid. Slight intrahepatic biliary duct dilatation. Common bile duct upper normal in size. No obstructing focus seen by ultrasound in the biliary ductal system. If further evaluation of the biliary ductal system is felt to be warranted, MRCP would be the optimum imaging study of choice for further assessment in this regard. Electronically Signed   By: Bretta Bang III M.D.   On: 04/10/2020 10:48    Medications: Scheduled Meds: . enoxaparin  40 mg Subcutaneous Q24H  . folic acid  1,000 mcg Oral Daily  . HYDROmorphone   Intravenous Q4H  . ketorolac  15 mg Intravenous Q6H  . omega-3 acid ethyl esters  1 g Oral Daily  . senna-docusate  1 tablet Oral BID  . zinc sulfate  220 mg Oral Daily   Continuous Infusions: . sodium chloride 100 mL/hr at 04/11/20 0233  . diphenhydrAMINE     PRN Meds:.diphenhydrAMINE **OR** diphenhydrAMINE, HYDROmorphone (DILAUDID) injection, methocarbamol, naloxone **AND** sodium chloride flush, ondansetron (ZOFRAN) IV, polyethylene glycol  Consultants:  None  Procedures:  None  Antibiotics:  None  Assessment/Plan: Active Problems:   Sickle cell pain crisis (HCC)  1. Hb Sickle Cell Disease with crisis: Reduce IVF .45% Saline to 50 mls/hour, continue weight based Dilaudid PCA, continue IV Toradol 15 mg Q 6 H, Monitor vitals very closely, Re-evaluate pain scale regularly, 2 L of Oxygen by . 2. Sickle Cell Anemia: Hemoglobin is stable at baseline.  No clinical indication for blood transfusion today. We will continue folic acid and monitor very closely. 3. Chronic pain Syndrome: Restart and continue home pain medications. 4. Status post left shoulder total arthroplasty: Patient had surgery on 03/18/2020, no complications, wound healed appropriately.Patient to start physical therapy next week. 5. Abdominal pain: Resolved.  No more  nausea or vomiting. Patient  tolerating p.o. intake. We will continue to monitor.  Code Status: Full Code Family Communication: N/A Disposition Plan: For possible discharge home tomorrow.  Olugbemiga Jegede  If 7PM-7AM, please contact night-coverage.  04/11/2020, 12:13 PM  LOS: 1 day

## 2020-04-12 LAB — CBC WITH DIFFERENTIAL/PLATELET
Abs Immature Granulocytes: 0.01 10*3/uL (ref 0.00–0.07)
Basophils Absolute: 0 10*3/uL (ref 0.0–0.1)
Basophils Relative: 1 %
Eosinophils Absolute: 0.1 10*3/uL (ref 0.0–0.5)
Eosinophils Relative: 4 %
HCT: 23.6 % — ABNORMAL LOW (ref 39.0–52.0)
Hemoglobin: 7.6 g/dL — ABNORMAL LOW (ref 13.0–17.0)
Immature Granulocytes: 0 %
Lymphocytes Relative: 32 %
Lymphs Abs: 0.9 10*3/uL (ref 0.7–4.0)
MCH: 22.4 pg — ABNORMAL LOW (ref 26.0–34.0)
MCHC: 32.2 g/dL (ref 30.0–36.0)
MCV: 69.4 fL — ABNORMAL LOW (ref 80.0–100.0)
Monocytes Absolute: 0.3 10*3/uL (ref 0.1–1.0)
Monocytes Relative: 10 %
Neutro Abs: 1.4 10*3/uL — ABNORMAL LOW (ref 1.7–7.7)
Neutrophils Relative %: 53 %
Platelets: 199 10*3/uL (ref 150–400)
RBC: 3.4 MIL/uL — ABNORMAL LOW (ref 4.22–5.81)
RDW: 25.3 % — ABNORMAL HIGH (ref 11.5–15.5)
WBC: 2.7 10*3/uL — ABNORMAL LOW (ref 4.0–10.5)
nRBC: 0 % (ref 0.0–0.2)

## 2020-04-12 NOTE — Progress Notes (Signed)
Pt discharged to home with wife. Discharge instructions and medication education provided to pt.  

## 2020-04-12 NOTE — Discharge Instructions (Signed)
Sickle Cell Anemia, Adult ° °Sickle cell anemia is a condition in which red blood cells have an abnormal “sickle” shape. Red blood cells carry oxygen through the body. Sickle-shaped red blood cells do not live as long as normal red blood cells. They also clump together and block blood from flowing through the blood vessels. This condition prevents the body from getting enough oxygen. Sickle cell anemia causes organ damage and pain. It also increases the risk of infection. °What are the causes? °This condition is caused by a gene that is passed from parent to child (inherited). Receiving two copies of the gene causes the disease. Receiving one copy causes the "trait," which means that symptoms are milder or not present. °What increases the risk? °This condition is more likely to develop if your ancestors were from [place], the [place], [place] or [place], the [place], [place], or the [place]. °What are the signs or symptoms? °Symptoms of this condition include: °· Episodes of pain (crises), especially in the hands and feet, joints, back, chest, or abdomen. The pain can be triggered by: °? An illness, especially if there is dehydration. °? Doing an activity with great effort (overexertion). °? Exposure to extreme temperature changes. °? High altitude. °· Fatigue. °· Shortness of breath or difficulty breathing. °· Dizziness. °· Pale skin or yellowed skin (jaundice). °· Frequent bacterial infections. °· Pain and swelling in the hands and feet (hand-food syndrome). °· Prolonged, painful erection of the penis (priapism). °· Acute chest syndrome. Symptoms of this include: °? Chest pain. °? Fever. °? Cough. °? Fast breathing. °· Stroke. °· Decreased activity. °· Loss of appetite. °· Change in behavior. °· Headaches. °· Seizures. °· Vision changes. °· Skin ulcers. °· Heart disease. °· High blood pressure. °· Gallstones. °· Liver and kidney problems. °How is this diagnosed? °This condition is diagnosed with  blood tests that check for the gene that causes this condition. °How is this treated? °There is no cure for most cases of this condition. Treatment focuses on managing your symptoms and preventing complications of the disease. Your health care provider will work with you to identify the best treatment options for you based on an assessment of your condition. Treatment may include: °· Medicines, including: °? Pain medicines. °? Antibiotic medicines for infection. °? Medicines to increase the production of a protein in red blood cells that helps carry oxygen in the body (hemoglobin). °· Fluids to treat pain and swelling. °· Oxygen to treat acute chest syndrome. °· Blood transfusions to treat symptoms such as fatigue, stroke, and acute chest syndrome. °· Massage and physical therapy for pain. °· Regular tests to monitor your condition, such as blood tests, X-rays, CT scans, MRI scans, ultrasounds, and lung function tests. These should be done every 3-12 months, depending on your age. °· Hematopoietic stem cell transplant. This is a procedure to replace abnormal stem cells with healthy stem cells from a donor's bone marrow. Stem cells are cells that can develop into blood cells, and bone marrow is the spongy tissue inside the bones. °Follow these instructions at home: °Medicines °· Take over-the-counter and prescription medicines only as told by your health care provider. °· If you were prescribed an antibiotic medicine, take it as told by your health care provider. Do not stop taking the antibiotic even if you start to feel better. °· If you develop a fever, do not take medicines to reduce the fever right away. This could cover up another problem. Notify your health care provider. °Managing   pain, stiffness, and swelling °· Try these methods to help ease your pain: °? Using a heating pad. °? Taking a warm bath. °? Distracting yourself, such as by watching TV. °Eating and drinking °· Drink enough fluid to keep your urine  clear or pale yellow. Drink more in hot weather and during exercise. °· Limit or avoid drinking alcohol. °· Eat a balanced and nutritious diet. Eat plenty of fruits, vegetables, whole grains, and lean protein. °· Take vitamins and supplements as directed by your health care provider. °Traveling °· When traveling, keep these with you: °? Your medical information. °? The names of your health care providers. °? Your medicines. °· If you have to travel by air, ask about precautions you should take. °Activity °· Get plenty of rest. °· Avoid activities that will lower your oxygen levels, such as exercising vigorously. °General instructions °· Do not use any products that contain nicotine or tobacco, such as cigarettes and e-cigarettes. They lower blood oxygen levels. If you need help quitting, ask your health care provider. °· Consider wearing a medical alert bracelet. °· Avoid high altitudes. °· Avoid extreme temperatures and extreme temperature changes. °· Keep all follow-up visits as told by your health care provider. This is important. °Contact a health care provider if: °· You develop joint pain. °· Your feet or hands swell or have pain. °· You have fatigue. °Get help right away if: °· You have symptoms of infection. These include: °? Fever. °? Chills. °? Extreme tiredness. °? Irritability. °? Poor eating. °? Vomiting. °· You feel dizzy or faint. °· You have new abdominal pain, especially on the left side near the stomach area. °· You develop priapism. °· You have numbness in your arms or legs or have trouble moving them. °· You have trouble talking. °· You develop pain that cannot be controlled with medicine. °· You become short of breath. °· You have rapid breathing. °· You have a persistent cough. °· You have pain in your chest. °· You develop a severe headache or stiff neck. °· You feel bloated without eating or after eating a small amount of food. °· Your skin is pale. °· You suddenly lose  vision. °Summary °· Sickle cell anemia is a condition in which red blood cells have an abnormal “sickle” shape. This disease can cause organ damage and chronic pain, and it can raise your risk of infection. °· Sickle cell anemia is a genetic disorder. °· Treatment focuses on managing your symptoms and preventing complications of the disease. °· Get medical help right away if you have any signs of infection, such as a fever. °This information is not intended to replace advice given to you by your health care provider. Make sure you discuss any questions you have with your health care provider. °Document Revised: 05/01/2019 Document Reviewed: 12/20/2016 °[employer] Patient Education © 2020 [employer] ° °

## 2020-04-12 NOTE — Discharge Summary (Signed)
Physician Discharge Summary  Stephano Arrants XKG:[id number] DOB: [birth year] DOA: 04/09/2020  PCP: Rogers Blocker, MD  Admit date: 04/09/2020  Discharge date: 04/12/2020  Discharge Diagnoses:  Active Problems:   Fever of unknown origin   Sickle cell pain crisis (HCC)   Gallstones   Generalized abdominal pain   Sickle cell anemia (HCC)   Non-intractable vomiting   Discharge Condition: Stable  Disposition:  Follow-up Information    Rogers Blocker, MD. Schedule an appointment as soon as possible for a visit in 1 week(s).   Specialty: Internal Medicine Contact information: [address] [place] [address] [phone]          Pt is discharged home in good condition and is to follow up with Rogers Blocker, MD this week to have labs evaluated. Lance Huaracha is instructed to increase activity slowly and balance with rest for the next few days, and use prescribed medication to complete treatment of pain  Diet: Regular Wt Readings from Last 3 Encounters:  04/09/20 70.3 kg  03/19/20 70.3 kg  03/10/20 69.4 kg    History of present illness:  [name]  is a 49 y.o. male with a medical history significant for sickle cell disease type , avascular necrosis of left femoral head, history of gastritis, history of osteoarthritis, history of tobacco dependence, and history of anemia of chronic disease presented to the emergency room with complaints of left shoulder and left forearm pain over the past week. Patient states that pain intensity has been increased and unrelieved by home medications. Patient is status post total left shoulder arthroplasty on 03/18/2020 and is followed by orthopedic specialist. Patient says that his pain has been mostly uncontrolled. Pain intensity is 8/10 characterized as constant and throbbing. On presentation, patient endorsed abdominal pain, nausea, and vomiting that has since resolved. Patient denies fever, chills, shortness of breath, urinary  symptoms, nausea, constipation vomiting, or diarrhea. He denies paresthesias or dizziness. No sick contacts, recent travel, or known exposure to COVID-19 infection.  Emergency room course: Vital signs show temperature of 99.3 F, BP 130/81, pulse 97, and oxygen saturation 100% on RA. Bilirubin slightly elevated at 1.5. Lipase negative. High-sensitivity troponin within normal range. Hemoglobin is 10.4, which appears to be consistent with patient's baseline. On presenting to the emergency department patient complained of some abdominal and chest pain. He underwent CT of chest that showed no evidence of pulmonary embolism, dilated main pulmonary artery that is consistent with pulmonary artery hypertension, and chronic changes related to sickle cell disease. Patient complained of abdominal pain, there was no acute abdominal, pelvic findings, masses, lesions, or adenopathy. Patchy left basilar atelectasis versus an early infiltrate noted. Patient has contracted gallbladder with suspicions of gallstones. Right upper quadrant ultrasound warranted. Patient also has splenomegaly without findings for splenic infarct. Patient spiked a temperature of 100.5 while in ER. Patient underwent left shoulder x-ray to rule out infection. He is status post total shoulder replacement on the left. No fractures, dislocation, or acute findings. 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 COVID-19 test negative. Acute cholecystitis ruled out. Patient admitted to [facility] for further management of sickle cell pain crisis.  Hospital Course:  Patient was admitted for sickle cell pain crisis and managed appropriately with IVF, IV Dilaudid via PCA and IV Toradol, as well as other adjunct therapies per sickle cell pain management protocols.  Patient is status post left shoulder total arthroplasty, healing really well, scheduled to  start physical therapy within the next  week.  Patient has a follow-up appointment with orthopedic surgeon.  While on admission patient was managed without antibiotics, fever resolved, abdominal pain resolved, to the extent that patient was tolerating p.o. intake with no restrictions.  Left shoulder and left forearm pain slowly got better with above regimen.  Hemoglobin remained stable at baseline with no clinical indication for blood transfusion.  Patient ambulating well with no significant pain.  Wife was at bedside most of the time, providing excellent support.  As at today, patient is doing really well, pain is back to baseline, eating and drinking well with no restriction and ambulating well with no significant pain. Patient was discharged home today in a hemodynamically stable condition. He will follow-up with PCP within 1 week of this discharge, start physical therapy as scheduled and follow-up with orthopedic surgeon as scheduled.  Patient verbalized understanding and was appreciative of his care during this admission.  Discharge Exam: Vitals:   04/12/20 0748 04/12/20 0950  BP: 129/81   Pulse: 76   Resp: 18 19  Temp: 98.8 F (37.1 C)   SpO2: 100% 99%   Vitals:   04/12/20 0432 04/12/20 0439 04/12/20 0748 04/12/20 0950  BP: 130/72  129/81   Pulse: 74  76   Resp: 17 15 18 19   Temp: 98.2 F (36.8 C)  98.8 F (37.1 C)   TempSrc: Oral  Oral   SpO2: 100% 100% 100% 99%  Weight:      Height:       General appearance : Awake, alert, not in any distress. Speech Clear. Not toxic looking HEENT: Atraumatic and Normocephalic, pupils equally reactive to light and accomodation Neck: Supple, no JVD. No cervical lymphadenopathy.  Chest: Good air entry bilaterally, no added sounds  CVS: S1 S2 regular, no murmurs.  Abdomen: Bowel sounds present, Non tender and not distended with no gaurding, rigidity or rebound. Extremities: B/L Lower Ext shows no edema, both legs are warm to touch.  Left upper limb in sling. Neurology: Awake alert, and  oriented X 3, CN II-XII intact, Non focal Skin: No Rash  Discharge Instructions  Discharge Instructions    Diet - low sodium heart healthy   Complete by: As directed    Increase activity slowly   Complete by: As directed      Allergies as of 04/12/2020   No Known Allergies     Medication List    TAKE these medications   acetaminophen 500 MG tablet Commonly known as: TYLENOL Take 1,000 mg by mouth every 4 (four) hours as needed for mild pain.   celecoxib 100 MG capsule Commonly known as: CeleBREX Take 1 capsule (100 mg total) by mouth 2 (two) times daily.   enoxaparin 40 MG/0.4ML injection Commonly known as: LOVENOX Inject 0.4 mLs (40 mg total) into the skin daily.   ferrous sulfate 325 (65 FE) MG tablet Take 325 mg by mouth daily with breakfast.   Fish Oil 1000 MG Caps Take 1,000 mg by mouth daily.   folic acid 400 MCG tablet Commonly known as: FOLVITE Take 400 mcg by mouth daily.   methocarbamol 500 MG tablet Commonly known as: Robaxin Take 1 tablet (500 mg total) by mouth every 8 (eight) hours as needed for muscle spasms.   ondansetron 4 MG tablet Commonly known as: ZOFRAN Take 4 mg by mouth every 8 (eight) hours as needed for nausea or vomiting.   oxyCODONE-acetaminophen 10-325 MG tablet Commonly known as: PERCOCET Take 1  tablet by mouth every 6 (six) hours as needed for pain.   Vitamin D3 250 MCG (10000 UT) Tabs Take 10,000 Units by mouth daily.   Zinc 50 MG Tabs Take 50 mg by mouth daily.       The results of significant diagnostics from this hospitalization (including imaging, microbiology, ancillary and laboratory) are listed below for reference.    Significant Diagnostic Studies: DG Chest 2 View  Result Date: 04/10/2020 CLINICAL DATA:  Chest pain EXAM: CHEST - 2 VIEW COMPARISON:  December 21, 2012 FINDINGS: The heart size and mediastinal contours are within normal limits. Both lungs are clear. A left reverse total shoulder arthroplasty is  present. No acute osseous abnormality is seen. IMPRESSION: No active cardiopulmonary disease. Electronically Signed   By: Jonna Clark M.D.   On: 04/10/2020 00:08   CT Angio Chest PE W/Cm &/Or Wo Cm  Result Date: 04/10/2020 CLINICAL DATA:  Left chest pain radiating to shoulder, sickle cell disease EXAM: CT ANGIOGRAPHY CHEST WITH CONTRAST TECHNIQUE: Multidetector CT imaging of the chest was performed using the standard protocol during bolus administration of intravenous contrast. Multiplanar CT image reconstructions and MIPs were obtained to evaluate the vascular anatomy. CONTRAST:  OMNIPAQUE IOHEXOL 350 MG/ML SOLN COMPARISON:  04/10/2020 FINDINGS: Cardiovascular: This is a technically adequate evaluation of the pulmonary vasculature. No filling defects or pulmonary emboli. Dilated main pulmonary artery consistent with pulmonary arterial hypertension. The heart is mildly enlarged without pericardial effusion. Normal appearance of the thoracic aorta without aneurysm or dissection. Mediastinum/Nodes: No enlarged mediastinal, hilar, or axillary lymph nodes. Thyroid gland, trachea, and esophagus demonstrate no significant findings. Lungs/Pleura: No acute airspace disease, effusion, or pneumothorax. Scattered scarring at the lung bases. Central airways are patent. Upper Abdomen: No acute abnormality. Musculoskeletal: Extensive bony changes related to sickle cell disease. Left shoulder arthroplasty. No acute bony abnormalities. Reconstructed images demonstrate no additional findings. Review of the MIP images confirms the above findings. IMPRESSION: 1. No evidence of pulmonary embolus. 2. Dilated main pulmonary artery consistent with pulmonary arterial hypertension. 3. Chronic changes related to sickle cell disease. No acute intrathoracic process. Electronically Signed   By: Sharlet Salina M.D.   On: 04/10/2020 01:43   CT Abdomen Pelvis W Contrast  Result Date: 04/10/2020 CLINICAL DATA:  Acute abdominal pain.  History of sickle cell disease. EXAM: CT ABDOMEN AND PELVIS WITH CONTRAST TECHNIQUE: Multidetector CT imaging of the abdomen and pelvis was performed using the standard protocol following bolus administration of intravenous contrast. CONTRAST:  61mL OMNIPAQUE IOHEXOL 300 MG/ML  SOLN COMPARISON:  04/15/2015 and chest CT, same date FINDINGS: Lower chest: Patchy left basilar atelectasis or early infiltrate/bronchopneumonia. Hepatobiliary: A few tiny scattered hepatic cysts. No worrisome hepatic lesions. Contracted gallbladder with suspected gallstones. Right upper quadrant ultrasound may be helpful for further evaluation. No common bile duct dilatation. Pancreas: No mass, inflammation or ductal dilatation. Incidental note is made of pancreatic divisum. Spleen: Splenomegaly. Spleen measures 14.5 x 14.5 x 9.3 cm. No splenic lesions or evidence of splenic infarct. Adrenals/Urinary Tract: Adrenal glands and kidneys are unremarkable. Contrast noted in the renal collecting systems and bladder from recent contrast enhanced chest CT. Stable left renal cyst. Stomach/Bowel: The stomach, duodenum, small bowel and colon are grossly normal without oral contrast. No acute inflammatory changes, mass lesions or obstructive findings. The terminal ileum is normal. The appendix is normal. Sigmoid colon diverticulosis without findings for acute diverticulitis. Vascular/Lymphatic: The aorta is normal in caliber. No dissection. The branch vessels are patent. The major  venous structures are patent. No mesenteric or retroperitoneal mass or adenopathy. Small scattered lymph nodes are noted. Reproductive: The prostate gland and seminal vesicles are unremarkable. Other: No pelvic mass or adenopathy. No free pelvic fluid collections. No inguinal mass or adenopathy. No abdominal wall hernia or subcutaneous lesions. Musculoskeletal: Osseous changes of sickle cell disease with H-shaped vertebrae and patchy areas of moderate sclerosis. Small focus of  chronic appearing AVN involving the right femoral head. Age advanced bilateral hip joint degenerative changes. The right SI joint is largely fused. IMPRESSION: 1. No acute abdominal/pelvic findings, mass lesions or adenopathy. 2. Patchy left basilar atelectasis versus early infiltrate. 3. Contracted gallbladder with suspected gallstones. Right upper quadrant ultrasound may be helpful for further evaluation. 4. Splenomegaly without findings for splenic infarct. 5. Sigmoid colon diverticulosis without findings for acute diverticulitis. 6. Osseous changes of sickle cell disease. Electronically Signed   By: Rudie MeyerP.  Gallerani M.D.   On: 04/10/2020 09:43   MR 3D Recon At Scanner  Result Date: 04/10/2020 CLINICAL DATA:  49 year old male with history of abdominal pain and vomiting in the past 24 hours. History of sickle cell disease. EXAM: MRI ABDOMEN WITHOUT AND WITH CONTRAST (INCLUDING MRCP) TECHNIQUE: Multiplanar multisequence MR imaging of the abdomen was performed both before and after the administration of intravenous contrast. Heavily T2-weighted images of the biliary and pancreatic ducts were obtained, and three-dimensional MRCP images were rendered by post processing. CONTRAST:  7mL GADAVIST GADOBUTROL 1 MMOL/ML IV SOLN COMPARISON:  No prior abdominal MRI. Abdominal ultrasound 04/10/2020. CT the abdomen and pelvis 04/10/2020. FINDINGS: Lower chest: Unremarkable. Hepatobiliary: Subcentimeter T1 hypointense, T2 hyperintense, nonenhancing lesions are noted in the right lobe of the liver, compatible with tiny simple cysts and/or biliary hamartomas. No other suspicious appearing hepatic lesions. No intra or extrahepatic biliary ductal dilatation. Common bile duct measures 5 mm in the porta hepatis. Small filling defects within the gallbladder, compatible with gallstones. Gallbladder is nearly completely contracted. No pericholecystic fluid or inflammatory changes. No filling defect within the common bile duct to suggest  choledocholithiasis. Pancreas: No pancreatic mass. No pancreatic ductal dilatation. No pancreatic or peripancreatic fluid collections or inflammatory changes. Spleen: Spleen is enlarged measuring 13.7 x 9.3 x 15.9 cm (estimated splenic volume of 1,013 mL) . Adrenals/Urinary Tract: Multiple T1 hypointense, T2 hyperintense, nonenhancing lesions are noted in the kidneys bilaterally, compatible with simple cysts, largest of which is in the interpolar region of the left kidney measuring 2 cm in diameter. No hydroureteronephrosis in the visualized portions of the abdomen. Bilateral adrenal glands are normal in appearance. Stomach/Bowel: Several colonic diverticulae are noted in the sigmoid colon. Vascular/Lymphatic: Aortic atherosclerosis, without evidence of aneurysm or dissection in the abdominal vasculature. No lymphadenopathy noted in the abdomen. Other: No significant volume of ascites noted in the visualized portions of the peritoneal cavity. Musculoskeletal: No aggressive appearing osseous lesions are noted in the visualized portions of the skeleton. IMPRESSION: 1. No acute findings are noted in the abdomen to account for the patient's symptoms. 2. Cholelithiasis without evidence of acute cholecystitis at this time. 3. Splenomegaly. 4. Additional incidental findings, as above. Electronically Signed   By: Trudie Reedaniel  Entrikin M.D.   On: 04/10/2020 14:58   DG Shoulder Left  Result Date: 04/10/2020 CLINICAL DATA:  Pain EXAM: LEFT SHOULDER - 2+ VIEW COMPARISON:  March 18, 2020; CT left shoulder January 24, 2020 FINDINGS: Oblique and Y scapular images were obtained. Patient is status post total shoulder replacement with prosthetic components well-seated. No fracture or dislocation. There is  apparent myositis ossificans in the lateral left shoulder region. There is no appreciable underlying arthropathy. Visualized left lung clear. IMPRESSION: Status post total shoulder replacement on the left with prosthetic components  appearing well-seated. No fracture or dislocation. 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 No appreciable arthropathic change. Electronically Signed   By: Bretta Bang III M.D.   On: 04/10/2020 10:45   DG Shoulder Left Port  Result Date: 03/18/2020 CLINICAL DATA:  Left shoulder replacement EXAM: LEFT SHOULDER COMPARISON:  01/24/2020 FINDINGS: Single frontal view of the left shoulder demonstrates total left shoulder arthroplasty in the expected position without signs of acute complication. Postsurgical changes are seen in the soft tissues. Left chest is clear. IMPRESSION: 1. Unremarkable left shoulder arthroplasty. Electronically Signed   By: Sharlet Salina M.D.   On: 03/18/2020 16:00   MR ABDOMEN MRCP W WO CONTAST  Result Date: 04/10/2020 CLINICAL DATA:  49 year old male with history of abdominal pain and vomiting in the past 24 hours. History of sickle cell disease. EXAM: MRI ABDOMEN WITHOUT AND WITH CONTRAST (INCLUDING MRCP) TECHNIQUE: Multiplanar multisequence MR imaging of the abdomen was performed both before and after the administration of intravenous contrast. Heavily T2-weighted images of the biliary and pancreatic ducts were obtained, and three-dimensional MRCP images were rendered by post processing. CONTRAST:  7mL GADAVIST GADOBUTROL 1 MMOL/ML IV SOLN COMPARISON:  No prior abdominal MRI. Abdominal ultrasound 04/10/2020. CT the abdomen and pelvis 04/10/2020. FINDINGS: Lower chest: Unremarkable. Hepatobiliary: Subcentimeter T1 hypointense, T2 hyperintense, nonenhancing lesions are noted in the right lobe of the liver, compatible with tiny simple cysts and/or biliary hamartomas. No other suspicious appearing hepatic lesions. No intra or extrahepatic biliary ductal dilatation. Common bile duct measures 5 mm in the porta hepatis. Small filling defects within the gallbladder, compatible with gallstones. Gallbladder is nearly completely contracted. No pericholecystic fluid  or inflammatory changes. No filling defect within the common bile duct to suggest choledocholithiasis. Pancreas: No pancreatic mass. No pancreatic ductal dilatation. No pancreatic or peripancreatic fluid collections or inflammatory changes. Spleen: Spleen is enlarged measuring 13.7 x 9.3 x 15.9 cm (estimated splenic volume of 1,013 mL) . Adrenals/Urinary Tract: Multiple T1 hypointense, T2 hyperintense, nonenhancing lesions are noted in the kidneys bilaterally, compatible with simple cysts, largest of which is in the interpolar region of the left kidney measuring 2 cm in diameter. No hydroureteronephrosis in the visualized portions of the abdomen. Bilateral adrenal glands are normal in appearance. Stomach/Bowel: Several colonic diverticulae are noted in the sigmoid colon. Vascular/Lymphatic: Aortic atherosclerosis, without evidence of aneurysm or dissection in the abdominal vasculature. No lymphadenopathy noted in the abdomen. Other: No significant volume of ascites noted in the visualized portions of the peritoneal cavity. Musculoskeletal: No aggressive appearing osseous lesions are noted in the visualized portions of the skeleton. IMPRESSION: 1. No acute findings are noted in the abdomen to account for the patient's symptoms. 2. Cholelithiasis without evidence of acute cholecystitis at this time. 3. Splenomegaly. 4. Additional incidental findings, as above. Electronically Signed   By: Trudie Reed M.D.   On: 04/10/2020 14:58   US Abdomen Limited RUQ  Result Date: 04/10/2020 CLINICAL DATA:  Upper abdominal pain EXAM: ULTRASOUND ABDOMEN LIMITED RIGHT UPPER QUADRANT COMPARISON:  CT abdomen and pelvis Apr 10, 2020 FINDINGS: Gallbladder: Gallbladder is contracted. There are multiple echogenic foci in the gallbladder consistent with cholelithiasis. Largest gallstone measures 6 mm in length. No appreciable gallbladder wall thickening or pericholecystic fluid. No sonographic Murphy sign noted by sonographer. Common  bile duct:  Diameter: 7 mm, upper normal in size. There is slight intrahepatic biliary duct dilatation. Liver: No focal lesion identified. Within normal limits in parenchymal echogenicity. Portal vein is patent on color Doppler imaging with normal direction of blood flow towards the liver. Other: None. IMPRESSION: Gallbladder contracted with cholelithiasis. No appreciable gallbladder wall thickening or pericholecystic fluid. Slight intrahepatic biliary duct dilatation. Common bile duct upper normal in size. No obstructing focus seen by ultrasound in the biliary ductal system. If further evaluation of the biliary ductal system is felt to be warranted, MRCP would be the optimum imaging study of choice for further assessment in this regard. Electronically Signed   By: Bretta Bang III M.D.   On: 04/10/2020 10:48    Microbiology: Recent Results (from the past 240 hour(s))  SARS Coronavirus 2 by RT PCR (hospital order, performed in [facility] hospital lab) Nasopharyngeal Nasopharyngeal Swab     Status: None   Collection Time: 04/10/20 12:16 AM   Specimen: Nasopharyngeal Swab  Result Value Ref Range Status   SARS Coronavirus 2 NEGATIVE NEGATIVE Final    Comment: (NOTE) SARS-CoV-2 target nucleic acids are NOT DETECTED. The SARS-CoV-2 RNA is generally detectable in upper and lower respiratory specimens during the acute phase of infection. The lowest concentration of SARS-CoV-2 viral copies this assay can detect is 250 copies / mL. A negative result does not preclude SARS-CoV-2 infection and should not be used as the sole basis for treatment or other patient management decisions.  A negative result may occur with improper specimen collection / handling, submission of specimen other than nasopharyngeal swab, presence of viral mutation(s) within the areas targeted by this assay, and inadequate number of viral copies (<250 copies / mL). A negative result must be combined with clinical observations,  patient history, and epidemiological information. Fact Sheet for Patients:   BoilerBrush.com.cy Fact Sheet for Healthcare Providers: https://pope.com/ This test is not yet approved or cleared  by the [place] FDA and has been authorized for detection and/or diagnosis of SARS-CoV-2 by FDA under an Emergency Use Authorization (EUA).  This EUA will remain in effect (meaning this test can be used) for the duration of the COVID-19 declaration under Section 564(b)(1) of the Act, 21 U.S.C. section 360bbb-3(b)(1), unless the authorization is terminated or revoked sooner. Performed at [facility], [address], [place], [place] [address]   Culture, blood (routine x 2)     Status: None (Preliminary result)   Collection Time: 04/10/20  7:34 AM   Specimen: BLOOD  Result Value Ref Range Status   Specimen Description   Final    BLOOD BLOOD RIGHT FOREARM Performed at [facility], [address] [place], [place] [address]    Special Requests   Final    BOTTLES DRAWN AEROBIC AND ANAEROBIC Blood Culture results may not be optimal due to an excessive volume of blood received in culture bottles Performed at [facility], [address] [place], [place] [address]    Culture   Final    NO GROWTH 2 DAYS Performed at [facility], [address], [place], [place] [address]    Report Status PENDING  Incomplete     Labs: Basic Metabolic Panel: Recent Labs  Lab 04/09/20 2357 04/11/20 0722  NA 139 133*  K 3.8 4.1  CL 99 104  CO2 27 24  GLUCOSE 125* 96  BUN 16 16  CREATININE 1.13 0.97  CALCIUM 10.0 8.6*   Liver Function Tests:  Recent Labs  Lab 04/09/20 2357 04/11/20 0722  AST 23 16  ALT 28 18  ALKPHOS 63 43  BILITOT 1.5* 0.9  PROT 8.9* 6.3*  ALBUMIN 5.0 3.3*   Recent Labs  Lab 04/09/20 2357  LIPASE 27   No results for input(s): AMMONIA in the last 168  hours. CBC: Recent Labs  Lab 04/09/20 2357 04/11/20 0722 04/12/20 0634  WBC 6.8 3.7* 2.7*  NEUTROABS  --  2.3 1.4*  HGB 10.4* 8.0* 7.6*  HCT 32.5* 24.5* 23.6*  MCV 68.9* 69.2* 69.4*  PLT 325 198 199   Cardiac Enzymes: No results for input(s): CKTOTAL, CKMB, CKMBINDEX, TROPONINI in the last 168 hours. BNP: Invalid input(s): POCBNP CBG: No results for input(s): GLUCAP in the last 168 hours.  Time coordinating discharge: 50 minutes  Signed:  Kazuma Elena  [facility] 04/12/2020, 10:58 AM

## 2020-04-15 LAB — CULTURE, BLOOD (ROUTINE X 2): Culture: NO GROWTH

## 2020-06-15 ENCOUNTER — Other Ambulatory Visit: Payer: Self-pay

## 2020-06-15 ENCOUNTER — Encounter (INDEPENDENT_AMBULATORY_CARE_PROVIDER_SITE_OTHER): Payer: Self-pay | Admitting: Ophthalmology

## 2020-06-15 ENCOUNTER — Ambulatory Visit (INDEPENDENT_AMBULATORY_CARE_PROVIDER_SITE_OTHER): Payer: BC Managed Care – PPO | Admitting: Ophthalmology

## 2020-06-15 DIAGNOSIS — H3521 Other non-diabetic proliferative retinopathy, right eye: Secondary | ICD-10-CM | POA: Insufficient documentation

## 2020-06-15 DIAGNOSIS — H35372 Puckering of macula, left eye: Secondary | ICD-10-CM

## 2020-06-15 DIAGNOSIS — H3341 Traction detachment of retina, right eye: Secondary | ICD-10-CM | POA: Insufficient documentation

## 2020-06-15 DIAGNOSIS — H3522 Other non-diabetic proliferative retinopathy, left eye: Secondary | ICD-10-CM | POA: Diagnosis not present

## 2020-06-15 DIAGNOSIS — H43822 Vitreomacular adhesion, left eye: Secondary | ICD-10-CM | POA: Diagnosis not present

## 2020-06-15 DIAGNOSIS — H33102 Unspecified retinoschisis, left eye: Secondary | ICD-10-CM | POA: Diagnosis not present

## 2020-06-15 DIAGNOSIS — H4312 Vitreous hemorrhage, left eye: Secondary | ICD-10-CM | POA: Insufficient documentation

## 2020-06-15 HISTORY — DX: Puckering of macula, left eye: H35.372

## 2020-06-15 NOTE — Assessment & Plan Note (Signed)
Active seafan retinopathy with fibrous traction, needs peripheral PRP anteriorly to halt neovascular stimulus, particularly temporally where this is very active with new blood, vitreous hemorrhage

## 2020-06-15 NOTE — Assessment & Plan Note (Signed)
Active seafan retinopathy with fibrous traction, needs peripheral PRP anteriorly to halt neovascular stimulus

## 2020-06-15 NOTE — Progress Notes (Signed)
06/15/2020     CHIEF COMPLAINT Patient presents for Retina Follow Up   HISTORY OF PRESENT ILLNESS: [name] is a 49 y.o. male who presents to the clinic today for:   HPI    Retina Follow Up    Patient presents with  Other.  In both eyes.  Duration of 3 years.  Since onset it is gradually worsening.          Comments    Last exam 2018 Patient states that his vision is still the same from several years ago, stating his vision is blurry OS.        Last edited by Gerda Diss on 06/15/2020 10:24 AM. (History)      Referring physician: Rogers Blocker, MD [place],  [place] [id number]  HISTORICAL INFORMATION:   Selected notes from the MEDICAL RECORD NUMBER       CURRENT MEDICATIONS: No current outpatient medications on file. (Ophthalmic Drugs)   No current facility-administered medications for this visit. (Ophthalmic Drugs)   Current Outpatient Medications (Other)  Medication Sig  . acetaminophen (TYLENOL) 500 MG tablet Take 1,000 mg by mouth every 4 (four) hours as needed for mild pain.  . Cholecalciferol (VITAMIN D3) 250 MCG (10000 UT) TABS Take 10,000 Units by mouth daily.  Marland Kitchen enoxaparin (LOVENOX) 40 MG/0.4ML injection Inject 0.4 mLs (40 mg total) into the skin daily.  . ferrous sulfate 325 (65 FE) MG tablet Take 325 mg by mouth daily with breakfast.  . folic acid (FOLVITE) 812 MCG tablet Take 400 mcg by mouth daily.  . methocarbamol (ROBAXIN) 500 MG tablet Take 1 tablet (500 mg total) by mouth every 8 (eight) hours as needed for muscle spasms. (Patient not taking: Reported on 04/10/2020)  . Omega-3 Fatty Acids (FISH OIL) 1000 MG CAPS Take 1,000 mg by mouth daily.  . ondansetron (ZOFRAN) 4 MG tablet Take 4 mg by mouth every 8 (eight) hours as needed for nausea or vomiting.  Marland Kitchen oxyCODONE-acetaminophen (PERCOCET) 10-325 MG tablet Take 1 tablet by mouth every 6 (six) hours as needed for pain.  . Zinc 50 MG TABS Take 50 mg by mouth daily.     No current facility-administered medications for this visit. (Other)      REVIEW OF SYSTEMS:    ALLERGIES No Known Allergies  PAST MEDICAL HISTORY Past Medical History:  Diagnosis Date  . Avascular necrosis of left femoral head ([place])   . Erectile dysfunction   . Gastritis and duodenitis   . GERD (gastroesophageal reflux disease)   . Low testosterone   . Mental disorder   . Mixed restrictive and obstructive lung disease ([place])   . Osteoarthrosis, unspecified whether generalized or localized, unspecified site   . Pain in joint, site unspecified   . Physical exam, annual 02/18/2010  . Sickle cell anemia (HCC)   . Tobacco use    Past Surgical History:  Procedure Laterality Date  . ELECTROCARDIOGRAM  01/31/2011  . ESOPHAGOGASTRODUODENOSCOPY  01/03/2013   Procedure: ESOPHAGOGASTRODUODENOSCOPY (EGD);  Surgeon: Ladene Artist, MD,FACG;  Location: [facility];  Service: Endoscopy;  Laterality: N/A;  . TOTAL SHOULDER ARTHROPLASTY Left 03/18/2020   Procedure: TOTAL SHOULDER ARTHROPLASTY;  Surgeon: Hiram Gash, MD;  Location: [facility];  Service: Orthopedics;  Laterality: Left;  . UPPER GASTROINTESTINAL ENDOSCOPY  11/18/2010    FAMILY HISTORY History reviewed. No pertinent family history.  SOCIAL HISTORY Social History   Tobacco Use  . Smoking status: Former Research scientist (life sciences)  . Smokeless  tobacco: Never Used  Vaping Use  . Vaping Use: Never used  Substance Use Topics  . Alcohol use: Yes    Alcohol/week: 0.0 standard drinks    Comment: OCCASIONAL  . Drug use: No         OPHTHALMIC EXAM:  Base Eye Exam    Visual Acuity (Snellen - Linear)      Right Left   Dist [place] 20/20-2 20/70-2   Dist ph [place]  NI       Tonometry (Tonopen, 10:29 AM)      Right Left   Pressure 13 17       Pupils      Pupils Dark Light Shape React APD   Right PERRL 3 3 Round Minimal None   Left PERRL 3 3 Round Minimal None       Visual Fields (Counting fingers)      Left Right    Full Full        Extraocular Movement      Right Left    Full Full       Neuro/Psych    Oriented x3: Yes   Mood/Affect: Normal       Dilation    Both eyes: 1.0% Mydriacyl, 2.5% Phenylephrine @ 10:29 AM        Slit Lamp and Fundus Exam    External Exam      Right Left   External Normal Normal       Slit Lamp Exam      Right Left   Lids/Lashes Normal Normal   Conjunctiva/Sclera White and quiet White and quiet   Cornea Clear Clear   Anterior Chamber Deep and quiet Deep and quiet   Iris Round and reactive Round and reactive   Anterior Vitreous Normal Normal       Fundus Exam      Right Left   Posterior Vitreous Normal Vitreal macular traction   Disc Normal Normal   C/D Ratio 0.25 0.2   Macula Normal Epiretinal membrane, severe topo distortion, vit ret traction   Vessels Old fibrofibrous and vascular disease, mostly inactive in this right eye temporally and inferiorly, yet with traction at 9:00 with atrophic hole, and areas anteriorly of retinal nonperfusion Hemoglobin  seafan retinopathy present peripherally, active temporally   Periphery Traction retinal detachment inferiorly and inferotemporally, with a hole, no progression because of posterior PRP Seafan retinopathy similarly present, with areas of active hemorrhage temporally at the 3:00 meridian in this left eye          IMAGING AND PROCEDURES  Imaging and Procedures for _0 @  OCT, Retina - OU - Both Eyes       Right Eye Quality was good. Scan locations included subfoveal. Central Foveal Thickness: 280. Findings include normal foveal contour.   Left Eye Quality was good. Scan locations included subfoveal. Central Foveal Thickness: 530. Progression has worsened. Findings include abnormal foveal contour, epiretinal membrane.   Notes OD, normal foveal architecture.  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 Left epiretinal membrane  H35.372 OCT, Retina - OU - Both Eyes  2. Macular retinoschisis of left eye  H33.102 OCT, Retina - OU - Both Eyes  3. Vitreomacular adhesion of left eye  H43.822 OCT, Retina - OU - Both Eyes  4. Nondiabetic proliferative retinopathy of left eye  H35.22   5. Nondiabetic proliferative retinopathy, right  H35.21   6. Traction detachment of right retina  H33.41   7. Vitreous hemorrhage of left eye (HCC)  H43.12     1.  First step in treatment of this patient with bilateral active nondiabetic proliferative retinopathy from hemoglobin [place] disease, is to prevent progression of disease in his best visual acuity I, the right.  Thus laser retinopexy, ablation peripherally will be delivered in the right eye soon.  2.  OS, will also need peripheral anterior PRP to decrease neovascular stimulus and diminish the active seafan retinopathy, nondiabetic proliferative retinopathy secondary to the hemoglobin [place] ischemic disease  3.  OS, out their PRP has been delivered, we can address the visual acuity by proceeding to vitrectomy with membrane peel of epiretinal membrane and vitreal macular traction to diminish foveal distortion and current foveal macular schisis.  Ophthalmic Meds Ordered this visit:  No orders of the defined types were placed in this encounter.      Return in about 1 week (around 06/22/2020) for OD, RETINOPEXY,, and  peripheral prp.  There are no Patient Instructions on file for this visit.   Explained the diagnoses, plan, and follow up with the patient and  they expressed understanding.  Patient expressed understanding of the importance of proper follow up care.   Clent Demark Seth Higginbotham M.D. Diseases & Surgery of the Retina and Vitreous [facility] [place] _0 @     Abbreviations: M myopia (nearsighted); A astigmatism; H hyperopia (farsighted); P presbyopia; Mrx spectacle prescription;  CTL contact lenses; OD right eye; OS left eye; OU both eyes  XT exotropia; ET esotropia; PEK punctate epithelial keratitis; PEE punctate epithelial erosions; DES dry eye syndrome; MGD meibomian gland dysfunction; ATs artificial tears; PFAT's preservative free artificial tears; [place] nuclear sclerotic cataract; PSC posterior subcapsular cataract; ERM epi-retinal membrane; PVD posterior vitreous detachment; RD retinal detachment; DM diabetes mellitus; DR diabetic retinopathy; NPDR non-proliferative diabetic retinopathy; PDR proliferative diabetic retinopathy; CSME clinically significant macular edema; DME diabetic macular edema; dbh dot blot hemorrhages; CWS cotton wool spot; POAG primary open angle glaucoma; C/D cup-to-disc ratio; HVF humphrey visual field; GVF goldmann visual field; OCT optical coherence tomography; IOP intraocular pressure; BRVO Branch retinal vein occlusion; CRVO central retinal vein occlusion; CRAO central retinal artery occlusion; BRAO branch retinal artery occlusion; RT retinal tear; SB scleral buckle; PPV pars plana vitrectomy; VH Vitreous hemorrhage; PRP panretinal laser photocoagulation; IVK intravitreal kenalog; VMT vitreomacular traction; MH Macular hole;  NVD neovascularization of the disc; NVE neovascularization elsewhere; AREDS age related eye disease study; ARMD age related macular degeneration; POAG primary open angle glaucoma; EBMD epithelial/anterior basement membrane dystrophy; ACIOL anterior chamber intraocular lens; IOL intraocular lens; PCIOL posterior chamber intraocular lens; Phaco/IOL phacoemulsification with intraocular lens  placement; [place] photorefractive keratectomy; LASIK laser assisted in situ keratomileusis; HTN hypertension; DM diabetes mellitus; COPD chronic obstructive pulmonary disease

## 2020-06-24 ENCOUNTER — Ambulatory Visit (INDEPENDENT_AMBULATORY_CARE_PROVIDER_SITE_OTHER): Payer: BC Managed Care – PPO | Admitting: Ophthalmology

## 2020-06-24 ENCOUNTER — Encounter (INDEPENDENT_AMBULATORY_CARE_PROVIDER_SITE_OTHER): Payer: Self-pay | Admitting: Ophthalmology

## 2020-06-24 ENCOUNTER — Other Ambulatory Visit: Payer: Self-pay

## 2020-06-24 DIAGNOSIS — H3521 Other non-diabetic proliferative retinopathy, right eye: Secondary | ICD-10-CM | POA: Diagnosis not present

## 2020-06-24 DIAGNOSIS — H3341 Traction detachment of retina, right eye: Secondary | ICD-10-CM | POA: Diagnosis not present

## 2020-06-24 NOTE — Progress Notes (Signed)
06/24/2020     CHIEF COMPLAINT Patient presents for Retina Follow Up   HISTORY OF PRESENT ILLNESS: [name] is a 49 y.o. male who presents to the clinic today for:   HPI    Retina Follow Up    Patient presents with  Retinal Break/Detachment.  In right eye.  Severity is moderate.  Duration of 1 week.  I, the attending physician,  performed the HPI with the patient and updated documentation appropriately.          Comments    Retinopexy OD  Pt states no changes in vision       Last edited by Elyse Jarvis on 06/24/2020  8:06 AM. (History)      Referring physician: Gwenyth Bender, MD [address] [place],  [place] [address]  HISTORICAL INFORMATION:   Selected notes from the MEDICAL RECORD NUMBER       CURRENT MEDICATIONS: No current outpatient medications on file. (Ophthalmic Drugs)   No current facility-administered medications for this visit. (Ophthalmic Drugs)   Current Outpatient Medications (Other)  Medication Sig  . acetaminophen (TYLENOL) 500 MG tablet Take 1,000 mg by mouth every 4 (four) hours as needed for mild pain.  . Cholecalciferol (VITAMIN D3) 250 MCG (10000 UT) TABS Take 10,000 Units by mouth daily.  Marland Kitchen enoxaparin (LOVENOX) 40 MG/0.4ML injection Inject 0.4 mLs (40 mg total) into the skin daily.  . ferrous sulfate 325 (65 FE) MG tablet Take 325 mg by mouth daily with breakfast.  . folic acid (FOLVITE) 400 MCG tablet Take 400 mcg by mouth daily.  . methocarbamol (ROBAXIN) 500 MG tablet Take 1 tablet (500 mg total) by mouth every 8 (eight) hours as needed for muscle spasms. (Patient not taking: Reported on 04/10/2020)  . Omega-3 Fatty Acids (FISH OIL) 1000 MG CAPS Take 1,000 mg by mouth daily.  . ondansetron (ZOFRAN) 4 MG tablet Take 4 mg by mouth every 8 (eight) hours as needed for nausea or vomiting.  Marland Kitchen oxyCODONE-acetaminophen (PERCOCET) 10-325 MG tablet Take 1 tablet by mouth every 6 (six) hours as needed for pain.  . Zinc 50  MG TABS Take 50 mg by mouth daily.   No current facility-administered medications for this visit. (Other)      REVIEW OF SYSTEMS:    ALLERGIES No Known Allergies  PAST MEDICAL HISTORY Past Medical History:  Diagnosis Date  . Avascular necrosis of left femoral head (HCC)   . Erectile dysfunction   . Gastritis and duodenitis   . GERD (gastroesophageal reflux disease)   . Low testosterone   . Mental disorder   . Mixed restrictive and obstructive lung disease (HCC)   . Osteoarthrosis, unspecified whether generalized or localized, unspecified site   . Pain in joint, site unspecified   . Physical exam, annual 02/18/2010  . Sickle cell anemia (HCC)   . Tobacco use    Past Surgical History:  Procedure Laterality Date  . ELECTROCARDIOGRAM  01/31/2011  . ESOPHAGOGASTRODUODENOSCOPY  01/03/2013   Procedure: ESOPHAGOGASTRODUODENOSCOPY (EGD);  Surgeon: Meryl Dare, MD,FACG;  Location: [facility];  Service: Endoscopy;  Laterality: N/A;  . TOTAL SHOULDER ARTHROPLASTY Left 03/18/2020   Procedure: TOTAL SHOULDER ARTHROPLASTY;  Surgeon: Bjorn Pippin, MD;  Location: [facility];  Service: Orthopedics;  Laterality: Left;  . UPPER GASTROINTESTINAL ENDOSCOPY  11/18/2010    FAMILY HISTORY History reviewed. No pertinent family history.  SOCIAL HISTORY Social History   Tobacco Use  . Smoking status: Former Games developer  .  Smokeless tobacco: Never Used  Vaping Use  . Vaping Use: Never used  Substance Use Topics  . Alcohol use: Yes    Alcohol/week: 0.0 standard drinks    Comment: OCCASIONAL  . Drug use: No         OPHTHALMIC EXAM:  Base Eye Exam    Visual Acuity (Snellen - Linear)      Right Left   Dist [place] 20/20 -1 20/80 -1   Dist ph   NI       Tonometry (Tonopen, 8:10 AM)      Right Left   Pressure 14 17       Pupils      Dark Light Shape React APD   Right 3 2.5 Round Slow None   Left 3 2.5 Round Slow None       Visual Fields (Counting fingers)      Left Right     Full Full       Neuro/Psych    Oriented x3: Yes   Mood/Affect: Normal       Dilation    Right eye: 1.0% Mydriacyl, 2.5% Phenylephrine @ 8:10 AM        Slit Lamp and Fundus Exam    External Exam      Right Left   External Normal Normal       Slit Lamp Exam      Right Left   Lids/Lashes Normal Normal   Conjunctiva/Sclera White and quiet White and quiet   Cornea Clear Clear   Anterior Chamber Deep and quiet Deep and quiet   Iris Round and reactive Round and reactive   Anterior Vitreous Normal Normal          IMAGING AND PROCEDURES  Imaging and Procedures for 06/24/20  Repair Retinal Breaks, Laser - OD - Right Eye       Tear locations include temporal.   Time Out Confirmed correct patient, procedure, site, and patient consented.   Anesthesia Topical anesthesia was used. Anesthetic medications included Proparacaine 0.5%.   Laser Information The type of laser was diode. Color was yellow. The duration in seconds was 0.02. The spot size was 390 microns. Laser power was 260. Total spots was 658.   Post-op The patient tolerated the procedure well. There were no complications. The patient received written and verbal post procedure care education.                 ASSESSMENT/PLAN:  No problem-specific Assessment & Plan notes found for this encounter.      ICD-10-CM   1. Traction detachment of right retina  H33.41 Repair Retinal Breaks, Laser - OD - Right Eye  2. Nondiabetic proliferative retinopathy, right  H35.21 Repair Retinal Breaks, Laser - OD - Right Eye    1. Peripheral PRP, retinopexy applied OD for seafan retinopathy.  2. Return visit next week for PRP left eye for seafan retinopathy  3. Post PRP left eye next week, will plan surgical intervention left eye via vitrectomy, membrane peel for severe epiretinal membrane with vision change  Ophthalmic Meds Ordered this visit:  No orders of the defined types were placed in this encounter.       Return in about 1 week (around 07/01/2020) for dilate, OS.  There are no Patient Instructions on file for this visit.   Explained the diagnoses, plan, and follow up with the patient and they expressed understanding.  Patient expressed understanding of the importance of proper follow up care.   Jillyn Hidden  Sabino Snipes M.D. Diseases & Surgery of the Retina and Vitreous [facility] 06/24/20     Abbreviations: M myopia (nearsighted); A astigmatism; H hyperopia (farsighted); P presbyopia; Mrx spectacle prescription;  CTL contact lenses; OD right eye; OS left eye; OU both eyes  XT exotropia; ET esotropia; PEK punctate epithelial keratitis; PEE punctate epithelial erosions; DES dry eye syndrome; MGD meibomian gland dysfunction; ATs artificial tears; PFAT's preservative free artificial tears; NSC nuclear sclerotic cataract; PSC posterior subcapsular cataract; ERM epi-retinal membrane; PVD posterior vitreous detachment; RD retinal detachment; DM diabetes mellitus; DR diabetic retinopathy; NPDR non-proliferative diabetic retinopathy; PDR proliferative diabetic retinopathy; CSME clinically significant macular edema; DME diabetic macular edema; dbh dot blot hemorrhages; CWS cotton wool spot; POAG primary open angle glaucoma; C/D cup-to-disc ratio; HVF humphrey visual field; GVF goldmann visual field; OCT optical coherence tomography; IOP intraocular pressure; BRVO Branch retinal vein occlusion; CRVO central retinal vein occlusion; CRAO central retinal artery occlusion; BRAO branch retinal artery occlusion; RT retinal tear; SB scleral buckle; PPV pars plana vitrectomy; VH Vitreous hemorrhage; PRP panretinal laser photocoagulation; IVK intravitreal kenalog; VMT vitreomacular traction; MH Macular hole;  NVD neovascularization of the disc; NVE neovascularization elsewhere; AREDS age related eye disease study; ARMD age related macular degeneration; POAG primary open angle glaucoma; EBMD epithelial/anterior  basement membrane dystrophy; ACIOL anterior chamber intraocular lens; IOL intraocular lens; PCIOL posterior chamber intraocular lens; Phaco/IOL phacoemulsification with intraocular lens placement; PRK photorefractive keratectomy; LASIK laser assisted in situ keratomileusis; HTN hypertension; DM diabetes mellitus; COPD chronic obstructive pulmonary disease

## 2020-07-01 ENCOUNTER — Encounter (INDEPENDENT_AMBULATORY_CARE_PROVIDER_SITE_OTHER): Payer: Self-pay | Admitting: Ophthalmology

## 2020-07-01 ENCOUNTER — Ambulatory Visit (INDEPENDENT_AMBULATORY_CARE_PROVIDER_SITE_OTHER): Payer: BC Managed Care – PPO | Admitting: Ophthalmology

## 2020-07-01 ENCOUNTER — Other Ambulatory Visit: Payer: Self-pay

## 2020-07-01 DIAGNOSIS — H3522 Other non-diabetic proliferative retinopathy, left eye: Secondary | ICD-10-CM | POA: Diagnosis not present

## 2020-07-01 DIAGNOSIS — H35372 Puckering of macula, left eye: Secondary | ICD-10-CM

## 2020-07-01 NOTE — Assessment & Plan Note (Signed)
Additional peripheral panel photocoagulation will deliver peripherally and posterior to large seafan retinopathy with residual  preretinal blood in the superotemporal quadrants.

## 2020-07-01 NOTE — Progress Notes (Signed)
07/01/2020     CHIEF COMPLAINT Patient presents for Retina Follow Up   HISTORY OF PRESENT ILLNESS: [name] is a 49 y.o. male who presents to the clinic today for:   HPI    Retina Follow Up    Patient presents with  Other.  In left eye.  This started 1 week ago.  Severity is mild.  Duration of 1 week.  Since onset it is stable.          Comments    1 Week PRP OS  Pt denies noticeable changes to [place] OU since last visit. Pt denies ocular pain, flashes of light, or floaters OU.         Last edited by Ileana Roup, COA on 07/01/2020  8:36 AM. (History)      Referring physician: Gwenyth Bender, MD [id number] [place],  [place] [address]  HISTORICAL INFORMATION:   Selected notes from the MEDICAL RECORD NUMBER       CURRENT MEDICATIONS: No current outpatient medications on file. (Ophthalmic Drugs)   No current facility-administered medications for this visit. (Ophthalmic Drugs)   Current Outpatient Medications (Other)  Medication Sig  . acetaminophen (TYLENOL) 500 MG tablet Take 1,000 mg by mouth every 4 (four) hours as needed for mild pain.  . Cholecalciferol (VITAMIN D3) 250 MCG (10000 UT) TABS Take 10,000 Units by mouth daily.  Marland Kitchen enoxaparin (LOVENOX) 40 MG/0.4ML injection Inject 0.4 mLs (40 mg total) into the skin daily.  . ferrous sulfate 325 (65 FE) MG tablet Take 325 mg by mouth daily with breakfast.  . folic acid (FOLVITE) 400 MCG tablet Take 400 mcg by mouth daily.  . methocarbamol (ROBAXIN) 500 MG tablet Take 1 tablet (500 mg total) by mouth every 8 (eight) hours as needed for muscle spasms. (Patient not taking: Reported on 04/10/2020)  . Omega-3 Fatty Acids (FISH OIL) 1000 MG CAPS Take 1,000 mg by mouth daily.  . ondansetron (ZOFRAN) 4 MG tablet Take 4 mg by mouth every 8 (eight) hours as needed for nausea or vomiting.  Marland Kitchen oxyCODONE-acetaminophen (PERCOCET) 10-325 MG tablet Take 1 tablet by mouth every 6 (six) hours as needed for pain.  .  Zinc 50 MG TABS Take 50 mg by mouth daily.   No current facility-administered medications for this visit. (Other)      REVIEW OF SYSTEMS:    ALLERGIES No Known Allergies  PAST MEDICAL HISTORY Past Medical History:  Diagnosis Date  . Avascular necrosis of left femoral head (HCC)   . Erectile dysfunction   . Gastritis and duodenitis   . GERD (gastroesophageal reflux disease)   . Low testosterone   . Mental disorder   . Mixed restrictive and obstructive lung disease (HCC)   . Osteoarthrosis, unspecified whether generalized or localized, unspecified site   . Pain in joint, site unspecified   . Physical exam, annual 02/18/2010  . Sickle cell anemia (HCC)   . Tobacco use    Past Surgical History:  Procedure Laterality Date  . ELECTROCARDIOGRAM  01/31/2011  . ESOPHAGOGASTRODUODENOSCOPY  01/03/2013   Procedure: ESOPHAGOGASTRODUODENOSCOPY (EGD);  Surgeon: Meryl Dare, MD,FACG;  Location: [facility];  Service: Endoscopy;  Laterality: N/A;  . TOTAL SHOULDER ARTHROPLASTY Left 03/18/2020   Procedure: TOTAL SHOULDER ARTHROPLASTY;  Surgeon: Bjorn Pippin, MD;  Location: [facility];  Service: Orthopedics;  Laterality: Left;  . UPPER GASTROINTESTINAL ENDOSCOPY  11/18/2010    FAMILY HISTORY History reviewed. No pertinent family history.  SOCIAL HISTORY Social  History   Tobacco Use  . Smoking status: Former Games developer  . Smokeless tobacco: Never Used  Vaping Use  . Vaping Use: Never used  Substance Use Topics  . Alcohol use: Yes    Alcohol/week: 0.0 standard drinks    Comment: OCCASIONAL  . Drug use: No         OPHTHALMIC EXAM:  Base Eye Exam    Visual Acuity (ETDRS)      Right Left   Dist [place] 20/20 -2 20/80ecc +1   Dist ph [place]  20/50 -2       Tonometry (Tonopen, 8:40 AM)      Right Left   Pressure 15 18       Pupils      Pupils Dark Light Shape React APD   Right PERRL 3 2 Round Slow None   Left PERRL 3 2 Round Slow None       Visual Fields (Counting fingers)       Left Right    Full Full       Extraocular Movement      Right Left    Full Full       Neuro/Psych    Oriented x3: Yes   Mood/Affect: Normal       Dilation    Left eye: 1.0% Mydriacyl, 2.5% Phenylephrine @ 8:40 AM          IMAGING AND PROCEDURES  Imaging and Procedures for 07/01/20  Panretinal Photocoagulation - OS - Left Eye       This is a duplicate       Panretinal Photocoagulation - OS - Left Eye       Time Out Confirmed correct patient, procedure, site, and patient consented.   Anesthesia Topical anesthesia was used. Anesthetic medications included Proparacaine 0.5%.   Laser Information The type of laser was diode. Color was yellow. The duration in seconds was 0.02. The spot size was 390 microns. Laser power was 260. Total spots was 854.   Post-op The patient tolerated the procedure well. There were no complications. The patient received written and verbal post procedure care education.   Notes Peripheral PRP delivered OS  at and around the seafan retinopathy, nearly 360.                ASSESSMENT/PLAN:  Nondiabetic proliferative retinopathy of left eye Additional peripheral panel photocoagulation will deliver peripherally and posterior to large seafan retinopathy with residual  preretinal blood in the superotemporal quadrants.  Left epiretinal membrane Now post PRP, or active seafan retinopathy from hemoglobinopathy.  Very epiretinal membrane left eye still accounts for visual acuity.  This will require vitrectomy with membrane peel.  We will schedule patient's convenience in the next 2 to 6 weeks  Patient wishes to proceed with a vitrectomy membrane peel left eye sometime in the coming weeks and will call us to schedule that      ICD-10-CM   1. Nondiabetic proliferative retinopathy of left eye  H35.22 Panretinal Photocoagulation - OS - Left Eye    Panretinal Photocoagulation - OS - Left Eye  2. Left epiretinal membrane  H35.372      1.  Today PRP delivered peripherally for active seafan retinopathy from hemoglobinopathy left eye.  Complications  2.  OS with severe epiretinal membrane with visual acuity impact.  We stand ready to proceed with vitrectomy membrane peel anytime after 2 weeks.  The patient wishes to call to schedule this in the coming weeks.  3.  Ophthalmic  Meds Ordered this visit:  No orders of the defined types were placed in this encounter.      Return for Schedule vitrectomy membrane peel left eye, [id number],   H35.372, [facility], LMAC, OS.  There are no Patient Instructions on file for this visit.   Explained the diagnoses, plan, and follow up with the patient and they expressed understanding.  Patient expressed understanding of the importance of proper follow up care.   Alford Highland Zaidyn Claire M.D. Diseases & Surgery of the Retina and Vitreous [facility] 07/01/20     Abbreviations: M myopia (nearsighted); A astigmatism; H hyperopia (farsighted); P presbyopia; Mrx spectacle prescription;  CTL contact lenses; OD right eye; OS left eye; OU both eyes  XT exotropia; ET esotropia; PEK punctate epithelial keratitis; PEE punctate epithelial erosions; DES dry eye syndrome; MGD meibomian gland dysfunction; ATs artificial tears; PFAT's preservative free artificial tears; NSC nuclear sclerotic cataract; PSC posterior subcapsular cataract; ERM epi-retinal membrane; PVD posterior vitreous detachment; RD retinal detachment; DM diabetes mellitus; DR diabetic retinopathy; NPDR non-proliferative diabetic retinopathy; PDR proliferative diabetic retinopathy; CSME clinically significant macular edema; DME diabetic macular edema; dbh dot blot hemorrhages; CWS cotton wool spot; POAG primary open angle glaucoma; C/D cup-to-disc ratio; HVF humphrey visual field; GVF goldmann visual field; OCT optical coherence tomography; IOP intraocular pressure; BRVO Branch retinal vein occlusion; CRVO central retinal vein occlusion;  CRAO central retinal artery occlusion; BRAO branch retinal artery occlusion; RT retinal tear; SB scleral buckle; PPV pars plana vitrectomy; VH Vitreous hemorrhage; PRP panretinal laser photocoagulation; IVK intravitreal kenalog; VMT vitreomacular traction; MH Macular hole;  NVD neovascularization of the disc; NVE neovascularization elsewhere; AREDS age related eye disease study; ARMD age related macular degeneration; POAG primary open angle glaucoma; EBMD epithelial/anterior basement membrane dystrophy; ACIOL anterior chamber intraocular lens; IOL intraocular lens; PCIOL posterior chamber intraocular lens; Phaco/IOL phacoemulsification with intraocular lens placement; PRK photorefractive keratectomy; LASIK laser assisted in situ keratomileusis; HTN hypertension; DM diabetes mellitus; COPD chronic obstructive pulmonary disease

## 2020-07-01 NOTE — Assessment & Plan Note (Addendum)
Now post PRP, or active seafan retinopathy from hemoglobinopathy.  Very epiretinal membrane left eye still accounts for visual acuity.  This will require vitrectomy with membrane peel.  We will schedule patient's convenience in the next 2 to 6 weeks  Patient wishes to proceed with a vitrectomy membrane peel left eye sometime in the coming weeks and will call us to schedule that

## 2020-08-31 ENCOUNTER — Encounter (INDEPENDENT_AMBULATORY_CARE_PROVIDER_SITE_OTHER): Payer: Self-pay

## 2020-08-31 ENCOUNTER — Ambulatory Visit (INDEPENDENT_AMBULATORY_CARE_PROVIDER_SITE_OTHER): Payer: BC Managed Care – PPO | Admitting: Ophthalmology

## 2020-08-31 ENCOUNTER — Ambulatory Visit (INDEPENDENT_AMBULATORY_CARE_PROVIDER_SITE_OTHER): Payer: BC Managed Care – PPO

## 2020-08-31 ENCOUNTER — Encounter (INDEPENDENT_AMBULATORY_CARE_PROVIDER_SITE_OTHER): Payer: Self-pay | Admitting: Ophthalmology

## 2020-08-31 ENCOUNTER — Other Ambulatory Visit: Payer: Self-pay

## 2020-08-31 DIAGNOSIS — H35372 Puckering of macula, left eye: Secondary | ICD-10-CM | POA: Diagnosis not present

## 2020-08-31 MED ORDER — OFLOXACIN 0.3 % OP SOLN
1.0000 [drp] | Freq: Four times a day (QID) | OPHTHALMIC | 0 refills | Status: AC
Start: 2020-08-31 — End: 2020-09-10

## 2020-08-31 MED ORDER — PREDNISOLONE ACETATE 1 % OP SUSP
1.0000 [drp] | Freq: Four times a day (QID) | OPHTHALMIC | 0 refills | Status: DC
Start: 2020-08-31 — End: 2021-08-17

## 2020-08-31 NOTE — Progress Notes (Signed)
08/31/2020     CHIEF COMPLAINT Patient presents for Retina Follow Up   HISTORY OF PRESENT ILLNESS: [name] is a 49 y.o. male who presents to the clinic today for:   HPI    Retina Follow Up    Diagnosis: ERM.  In left eye.  Severity is moderate.  Duration of 1 month.  Since onset it is stable.  I, the attending physician,  performed the HPI with the patient and updated documentation appropriately.          Comments    1 Month ERM f\u OS. OCT  Pt states vision has been stable. Pt c/o OD being watery.        Last edited by Tilda Franco on 08/31/2020 10:58 AM. (History)      Referring physician: Rogers Blocker, MD [place],  [place] [address]  HISTORICAL INFORMATION:   Selected notes from the MEDICAL RECORD NUMBER       CURRENT MEDICATIONS: No current outpatient medications on file. (Ophthalmic Drugs)   No current facility-administered medications for this visit. (Ophthalmic Drugs)   Current Outpatient Medications (Other)  Medication Sig  . acetaminophen (TYLENOL) 500 MG tablet Take 1,000 mg by mouth every 4 (four) hours as needed for mild pain.  . Cholecalciferol (VITAMIN D3) 250 MCG (10000 UT) TABS Take 10,000 Units by mouth daily.  Marland Kitchen enoxaparin (LOVENOX) 40 MG/0.4ML injection Inject 0.4 mLs (40 mg total) into the skin daily.  . ferrous sulfate 325 (65 FE) MG tablet Take 325 mg by mouth daily with breakfast.  . folic acid (FOLVITE) 382 MCG tablet Take 400 mcg by mouth daily.  . methocarbamol (ROBAXIN) 500 MG tablet Take 1 tablet (500 mg total) by mouth every 8 (eight) hours as needed for muscle spasms. (Patient not taking: Reported on 04/10/2020)  . Omega-3 Fatty Acids (FISH OIL) 1000 MG CAPS Take 1,000 mg by mouth daily.  . ondansetron (ZOFRAN) 4 MG tablet Take 4 mg by mouth every 8 (eight) hours as needed for nausea or vomiting.  Marland Kitchen oxyCODONE-acetaminophen (PERCOCET) 10-325 MG tablet Take 1 tablet by mouth every 6 (six) hours  as needed for pain.  . Zinc 50 MG TABS Take 50 mg by mouth daily.   No current facility-administered medications for this visit. (Other)      REVIEW OF SYSTEMS:    ALLERGIES No Known Allergies  PAST MEDICAL HISTORY Past Medical History:  Diagnosis Date  . Avascular necrosis of left femoral head ([place])   . Erectile dysfunction   . Gastritis and duodenitis   . GERD (gastroesophageal reflux disease)   . Low testosterone   . Mental disorder   . Mixed restrictive and obstructive lung disease ([place])   . Osteoarthrosis, unspecified whether generalized or localized, unspecified site   . Pain in joint, site unspecified   . Physical exam, annual 02/18/2010  . Sickle cell anemia (HCC)   . Tobacco use    Past Surgical History:  Procedure Laterality Date  . ELECTROCARDIOGRAM  01/31/2011  . ESOPHAGOGASTRODUODENOSCOPY  01/03/2013   Procedure: ESOPHAGOGASTRODUODENOSCOPY (EGD);  Surgeon: Ladene Artist, MD,FACG;  Location: [facility];  Service: Endoscopy;  Laterality: N/A;  . TOTAL SHOULDER ARTHROPLASTY Left 03/18/2020   Procedure: TOTAL SHOULDER ARTHROPLASTY;  Surgeon: Hiram Gash, MD;  Location: [facility];  Service: Orthopedics;  Laterality: Left;  . UPPER GASTROINTESTINAL ENDOSCOPY  11/18/2010    FAMILY HISTORY History reviewed. No pertinent family history.  SOCIAL HISTORY Social History  Tobacco Use  . Smoking status: Former Research scientist (life sciences)  . Smokeless tobacco: Never Used  Vaping Use  . Vaping Use: Never used  Substance Use Topics  . Alcohol use: Yes    Alcohol/week: 0.0 standard drinks    Comment: OCCASIONAL  . Drug use: No         OPHTHALMIC EXAM: Base Eye Exam    Visual Acuity (Snellen - Linear)      Right Left   Dist [place] 20/25   20/100 -2   Dist ph [place]  20/80 -2       Tonometry (Tonopen, 11:02 AM)      Right Left   Pressure 19 21       Neuro/Psych    Oriented x3: Yes   Mood/Affect: Normal       Dilation    Left eye: 1.0% Mydriacyl, 2.5% Phenylephrine @ 11:02  AM        Slit Lamp and Fundus Exam    External Exam      Right Left   External Normal Normal       Slit Lamp Exam      Right Left   Lids/Lashes Normal Normal   Conjunctiva/Sclera White and quiet White and quiet   Cornea Clear Clear   Anterior Chamber Deep and quiet Deep and quiet   [place] Round and reactive Round and reactive   Lens  2+ Nuclear sclerosis   Anterior Vitreous Normal Normal       Fundus Exam      Right Left   Posterior Vitreous  Vitreal macular traction   Disc  Normal   C/D Ratio  0.2   Macula  Epiretinal membrane, severe topo distortion, vit ret traction   Vessels  Hemoglobin [place] seafan retinopathy present peripherally, active temporally   Periphery  Seafan retinopathy similarly present, with areas of active hemorrhage temporally at the 3:00 meridian in this left eye          IMAGING AND PROCEDURES  Imaging and Procedures for 08/31/20  OCT, Retina - OU - Both Eyes       Right Eye Quality was good. Scan locations included subfoveal. Central Foveal Thickness: 277. Progression has been stable.   Left Eye Quality was good. Scan locations included subfoveal. Central Foveal Thickness: 545. Progression has worsened. Findings include abnormal foveal contour, epiretinal membrane.   Notes OS with diffuse foveal macular schisis secondary to severe epiretinal membrane and vitreal macular traction                ASSESSMENT/PLAN:  No problem-specific Assessment & Plan notes found for this encounter.      ICD-10-CM   1. Left epiretinal membrane  H35.372 OCT, Retina - OU - Both Eyes    1.  All questions answered.  2.  3.  Ophthalmic Meds Ordered this visit:  No orders of the defined types were placed in this encounter.      Return [facility], for Schedule vitrectomy, ILM peel left eye, LMAC, OS.  There are no Patient Instructions on file for this visit.   Explained the diagnoses, plan, and follow up with the patient and they  expressed understanding.  Patient expressed understanding of the importance of proper follow up care.   Clent Demark Danilyn Cocke M.D. Diseases & Surgery of the Retina and Vitreous [facility] 08/31/20     Abbreviations: M myopia (nearsighted); A astigmatism; H hyperopia (farsighted); P presbyopia; Mrx spectacle prescription;  CTL contact lenses; OD  right eye; OS left eye; OU both eyes  XT exotropia; ET esotropia; PEK punctate epithelial keratitis; PEE punctate epithelial erosions; DES dry eye syndrome; MGD meibomian gland dysfunction; ATs artificial tears; PFAT's preservative free artificial tears; [place] nuclear sclerotic cataract; PSC posterior subcapsular cataract; ERM epi-retinal membrane; PVD posterior vitreous detachment; RD retinal detachment; DM diabetes mellitus; DR diabetic retinopathy; NPDR non-proliferative diabetic retinopathy; PDR proliferative diabetic retinopathy; CSME clinically significant macular edema; DME diabetic macular edema; dbh dot blot hemorrhages; CWS cotton wool spot; POAG primary open angle glaucoma; C/D cup-to-disc ratio; HVF humphrey visual field; GVF goldmann visual field; OCT optical coherence tomography; IOP intraocular pressure; BRVO Branch retinal vein occlusion; CRVO central retinal vein occlusion; CRAO central retinal artery occlusion; BRAO branch retinal artery occlusion; RT retinal tear; SB scleral buckle; PPV pars plana vitrectomy; VH Vitreous hemorrhage; PRP panretinal laser photocoagulation; IVK intravitreal kenalog; VMT vitreomacular traction; MH Macular hole;  NVD neovascularization of the disc; NVE neovascularization elsewhere; AREDS age related eye disease study; ARMD age related macular degeneration; POAG primary open angle glaucoma; EBMD epithelial/anterior basement membrane dystrophy; ACIOL anterior chamber intraocular lens; IOL intraocular lens; PCIOL posterior chamber intraocular lens; Phaco/IOL phacoemulsification with intraocular lens placement;  [place] photorefractive keratectomy; LASIK laser assisted in situ keratomileusis; HTN hypertension; DM diabetes mellitus; COPD chronic obstructive pulmonary disease

## 2020-08-31 NOTE — Assessment & Plan Note (Signed)
Risks and benefits of surgical intervention reviewed and the surgical experience discussed.  Explained the patient that removal of the epiretinal membrane initiates the improvement of anatomy of the left eye and potentially the acuity which can take weeks but is also months.  Also explained to the patient that cataract progression and that is likely  6 months to 6 years at which time routine cataract surgery could be undertaken with a cataract surgeon

## 2020-09-09 ENCOUNTER — Ambulatory Visit (INDEPENDENT_AMBULATORY_CARE_PROVIDER_SITE_OTHER): Payer: BC Managed Care – PPO

## 2020-09-16 ENCOUNTER — Encounter (INDEPENDENT_AMBULATORY_CARE_PROVIDER_SITE_OTHER): Payer: BC Managed Care – PPO | Admitting: Ophthalmology

## 2020-09-16 DIAGNOSIS — H35372 Puckering of macula, left eye: Secondary | ICD-10-CM | POA: Diagnosis not present

## 2020-09-16 HISTORY — PX: EYE SURGERY: SHX253

## 2020-09-17 ENCOUNTER — Encounter (INDEPENDENT_AMBULATORY_CARE_PROVIDER_SITE_OTHER): Payer: Self-pay | Admitting: Ophthalmology

## 2020-09-17 ENCOUNTER — Ambulatory Visit (INDEPENDENT_AMBULATORY_CARE_PROVIDER_SITE_OTHER): Payer: BC Managed Care – PPO | Admitting: Ophthalmology

## 2020-09-17 ENCOUNTER — Other Ambulatory Visit: Payer: Self-pay

## 2020-09-17 DIAGNOSIS — H35372 Puckering of macula, left eye: Secondary | ICD-10-CM

## 2020-09-17 NOTE — Progress Notes (Signed)
09/17/2020     CHIEF COMPLAINT Patient presents for Post-op Follow-up   HISTORY OF PRESENT ILLNESS: [name] is a 49 y.o. male who presents to the clinic today for:   HPI    Post-op Follow-up    In left eye.  Discomfort includes tearing.  Vision is stable.  I, the attending physician,  performed the HPI with the patient and updated documentation appropriately.          Comments    1 Day s\p Membrane Peel and Vitrectomy OS,  Pt states OS was slightly achy. Pt states OS has been very watery.       Last edited by Edmon Crape, MD on 09/17/2020  8:39 AM. (History)      Referring physician: Gwenyth Bender, MD [address] [place],  [place] [address]  HISTORICAL INFORMATION:   Selected notes from the MEDICAL RECORD NUMBER       CURRENT MEDICATIONS: Current Outpatient Medications (Ophthalmic Drugs)  Medication Sig  . prednisoLONE acetate (PRED FORTE) 1 % ophthalmic suspension Place 1 drop into the left eye 4 (four) times daily.   No current facility-administered medications for this visit. (Ophthalmic Drugs)   Current Outpatient Medications (Other)  Medication Sig  . acetaminophen (TYLENOL) 500 MG tablet Take 1,000 mg by mouth every 4 (four) hours as needed for mild pain.  . Cholecalciferol (VITAMIN D3) 250 MCG (10000 UT) TABS Take 10,000 Units by mouth daily.  Marland Kitchen enoxaparin (LOVENOX) 40 MG/0.4ML injection Inject 0.4 mLs (40 mg total) into the skin daily.  . ferrous sulfate 325 (65 FE) MG tablet Take 325 mg by mouth daily with breakfast.  . folic acid (FOLVITE) 400 MCG tablet Take 400 mcg by mouth daily.  . methocarbamol (ROBAXIN) 500 MG tablet Take 1 tablet (500 mg total) by mouth every 8 (eight) hours as needed for muscle spasms. (Patient not taking: Reported on 04/10/2020)  . Omega-3 Fatty Acids (FISH OIL) 1000 MG CAPS Take 1,000 mg by mouth daily.  . ondansetron (ZOFRAN) 4 MG tablet Take 4 mg by mouth every 8 (eight) hours as needed for nausea  or vomiting.  Marland Kitchen oxyCODONE-acetaminophen (PERCOCET) 10-325 MG tablet Take 1 tablet by mouth every 6 (six) hours as needed for pain.  . Zinc 50 MG TABS Take 50 mg by mouth daily.   No current facility-administered medications for this visit. (Other)      REVIEW OF SYSTEMS:    ALLERGIES No Known Allergies  PAST MEDICAL HISTORY Past Medical History:  Diagnosis Date  . Avascular necrosis of left femoral head (HCC)   . Erectile dysfunction   . Gastritis and duodenitis   . GERD (gastroesophageal reflux disease)   . Low testosterone   . Mental disorder   . Mixed restrictive and obstructive lung disease (HCC)   . Osteoarthrosis, unspecified whether generalized or localized, unspecified site   . Pain in joint, site unspecified   . Physical exam, annual 02/18/2010  . Sickle cell anemia (HCC)   . Tobacco use    Past Surgical History:  Procedure Laterality Date  . ELECTROCARDIOGRAM  01/31/2011  . ESOPHAGOGASTRODUODENOSCOPY  01/03/2013   Procedure: ESOPHAGOGASTRODUODENOSCOPY (EGD);  Surgeon: Meryl Dare, MD,FACG;  Location: [facility];  Service: Endoscopy;  Laterality: N/A;  . EYE SURGERY Left 09/16/2020   Membrane Peel and Vitrectomy, Dr. Luciana Axe  . TOTAL SHOULDER ARTHROPLASTY Left 03/18/2020   Procedure: TOTAL SHOULDER ARTHROPLASTY;  Surgeon: Bjorn Pippin, MD;  Location: [facility];  Service:  Orthopedics;  Laterality: Left;  . UPPER GASTROINTESTINAL ENDOSCOPY  11/18/2010    FAMILY HISTORY History reviewed. No pertinent family history.  SOCIAL HISTORY Social History   Tobacco Use  . Smoking status: Former Games developer  . Smokeless tobacco: Never Used  Vaping Use  . Vaping Use: Never used  Substance Use Topics  . Alcohol use: Yes    Alcohol/week: 0.0 standard drinks    Comment: OCCASIONAL  . Drug use: No         OPHTHALMIC EXAM:  Base Eye Exam    Visual Acuity (Snellen - Linear)      Right Left   Dist [place] 20/30 -2 20/200 -   Dist ph [place]  20/50       Tonometry  (Tonopen, 8:10 AM)      Right Left   Pressure 15 7       Pupils      Dark Light Shape React   Right       Left 6 6 Round Dilated       Neuro/Psych    Oriented x3: Yes   Mood/Affect: Normal        Slit Lamp and Fundus Exam    External Exam      Right Left   External Normal Normal       Slit Lamp Exam      Right Left   Lids/Lashes Normal Normal   Conjunctiva/Sclera White and quiet White and quiet   Cornea Clear Clear   Anterior Chamber Deep and quiet Deep and quiet   Iris Round and reactive Round and reactive   Lens  2+ Nuclear sclerosis   Anterior Vitreous Normal Normal       Fundus Exam      Right Left   Posterior Vitreous  Clear view, vitrectomized   Disc  Normal   C/D Ratio  0.2   Macula  Normal, much less topographic distortion   Vessels  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 Left epiretinal membrane  H35.372     1.  Postop day #1 looks great, will observe status post vitrectomy membrane peel release of vitreal papillary traction  2.  3.  Ophthalmic Meds Ordered this visit:  No orders of the defined types were placed in this encounter.      Return in about 1 week (around 09/24/2020) for dilate, OS, OCT.  Patient Instructions  Patient instructed No lifting and bending for 1 week. No water in the eye for 10 days. Do not rub the eye. Wear shield at night for 1-3 days.  Wear your CPAP as normal, if instructed by your doctor.  Continue your topical medications for a total of 3 weeks.  Do not refill your postoperative medications unless  instructed.  Patient to resume topical medications to the left eye each bottle 4 times daily   Ofloxacin 1 drop left eye 4 times daily  Prednisolone acetate, Pred forte 1 drop left eye 4 times daily    Explained the diagnoses, plan, and follow up with the patient and they  expressed understanding.  Patient expressed understanding of the importance of proper follow up care.   Alford Highland Beverly Suriano M.D. Diseases & Surgery of the Retina and Vitreous [facility] 09/17/20     Abbreviations: M myopia (nearsighted); A astigmatism; H hyperopia (farsighted); P presbyopia; Mrx spectacle prescription;  CTL contact lenses; OD right eye; OS left eye; OU both eyes  XT exotropia; ET esotropia; PEK punctate epithelial keratitis; PEE punctate epithelial erosions; DES dry eye syndrome; MGD meibomian gland dysfunction; ATs artificial tears; PFAT's preservative free artificial tears; NSC nuclear sclerotic cataract; PSC posterior subcapsular cataract; ERM epi-retinal membrane; PVD posterior vitreous detachment; RD retinal detachment; DM diabetes mellitus; DR diabetic retinopathy; NPDR non-proliferative diabetic retinopathy; PDR proliferative diabetic retinopathy; CSME clinically significant macular edema; DME diabetic macular edema; dbh dot blot hemorrhages; CWS cotton wool spot; POAG primary open angle glaucoma; C/D cup-to-disc ratio; HVF humphrey visual field; GVF goldmann visual field; OCT optical coherence tomography; IOP intraocular pressure; BRVO Branch retinal vein occlusion; CRVO central retinal vein occlusion; CRAO central retinal artery occlusion; BRAO branch retinal artery occlusion; RT retinal tear; SB scleral buckle; PPV pars plana vitrectomy; VH Vitreous hemorrhage; PRP panretinal laser photocoagulation; IVK intravitreal kenalog; VMT vitreomacular traction; MH Macular hole;  NVD neovascularization of the disc; NVE neovascularization elsewhere; AREDS age related eye disease study; ARMD age  related macular degeneration; POAG primary open angle glaucoma; EBMD epithelial/anterior basement membrane dystrophy; ACIOL anterior chamber intraocular lens; IOL intraocular lens; PCIOL posterior chamber intraocular lens; Phaco/IOL phacoemulsification with intraocular lens placement; PRK photorefractive keratectomy; LASIK laser assisted in situ keratomileusis; HTN hypertension; DM diabetes mellitus; COPD chronic obstructive pulmonary disease

## 2020-09-17 NOTE — Assessment & Plan Note (Signed)
Postop day #1 looks great OS, restart topical medications limited activity RV 1 week

## 2020-09-17 NOTE — Patient Instructions (Addendum)
Patient instructed No lifting and bending for 1 week. No water in the eye for 10 days. Do not rub the eye. Wear shield at night for 1-3 days.  Wear your CPAP as normal, if instructed by your doctor.  Continue your topical medications for a total of 3 weeks.  Do not refill your postoperative medications unless instructed.  Patient to resume topical medications to the left eye each bottle 4 times daily   Ofloxacin 1 drop left eye 4 times daily  Prednisolone acetate, Pred forte 1 drop left eye 4 times daily

## 2020-09-24 ENCOUNTER — Ambulatory Visit (INDEPENDENT_AMBULATORY_CARE_PROVIDER_SITE_OTHER): Payer: BC Managed Care – PPO | Admitting: Ophthalmology

## 2020-09-24 ENCOUNTER — Other Ambulatory Visit: Payer: Self-pay

## 2020-09-24 ENCOUNTER — Encounter (INDEPENDENT_AMBULATORY_CARE_PROVIDER_SITE_OTHER): Payer: Self-pay | Admitting: Ophthalmology

## 2020-09-24 DIAGNOSIS — H33102 Unspecified retinoschisis, left eye: Secondary | ICD-10-CM

## 2020-09-24 DIAGNOSIS — H35372 Puckering of macula, left eye: Secondary | ICD-10-CM

## 2020-09-24 DIAGNOSIS — Z09 Encounter for follow-up examination after completed treatment for conditions other than malignant neoplasm: Secondary | ICD-10-CM

## 2020-09-24 DIAGNOSIS — H43822 Vitreomacular adhesion, left eye: Secondary | ICD-10-CM

## 2020-09-24 NOTE — Assessment & Plan Note (Signed)
Patient asked to continue on topical therapy for only 2 more weeks.  If the medications topically are complete prior to that time in the left eye, no refills are required.

## 2020-09-24 NOTE — Assessment & Plan Note (Signed)
Technically resolved, status post removal 1 week previous

## 2020-09-24 NOTE — Assessment & Plan Note (Signed)
1 week post vitrectomy, vastly improved

## 2020-09-24 NOTE — Assessment & Plan Note (Signed)
1 week post vitrectomy membrane peel, vastly improved

## 2020-09-24 NOTE — Progress Notes (Signed)
09/24/2020     CHIEF COMPLAINT Patient presents for Post-op Follow-up   HISTORY OF PRESENT ILLNESS: [name] is a 49 y.o. male who presents to the clinic today for:   HPI    Post-op Follow-up    In left eye.  Vision is stable.          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  Pt sts redness OS has improved. Pt sts he hasn't really paid attention to any VA changes OS, but sts it "looks like it's getting better because it's not as red." VA OD stable.       Last edited by Edmon Crape, MD on 09/24/2020  9:50 AM. (History)      Referring physician: Gwenyth Bender, MD [address] [place],  [place] [address]  HISTORICAL INFORMATION:   Selected notes from the MEDICAL RECORD NUMBER       CURRENT MEDICATIONS: Current Outpatient Medications (Ophthalmic Drugs)  Medication Sig  . prednisoLONE acetate (PRED FORTE) 1 % ophthalmic suspension Place 1 drop into the left eye 4 (four) times daily.   No current facility-administered medications for this visit. (Ophthalmic Drugs)   Current Outpatient Medications (Other)  Medication Sig  . acetaminophen (TYLENOL) 500 MG tablet Take 1,000 mg by mouth every 4 (four) hours as needed for mild pain.  . Cholecalciferol (VITAMIN D3) 250 MCG (10000 UT) TABS Take 10,000 Units by mouth daily.  Marland Kitchen enoxaparin (LOVENOX) 40 MG/0.4ML injection Inject 0.4 mLs (40 mg total) into the skin daily.  . ferrous sulfate 325 (65 FE) MG tablet Take 325 mg by mouth daily with breakfast.  . folic acid (FOLVITE) 400 MCG tablet Take 400 mcg by mouth daily.  . methocarbamol (ROBAXIN) 500 MG tablet Take 1 tablet (500 mg total) by mouth every 8 (eight) hours as needed for muscle spasms. (Patient not taking: Reported on 04/10/2020)  . Omega-3 Fatty Acids (FISH OIL) 1000 MG CAPS Take 1,000 mg by mouth daily.  . ondansetron (ZOFRAN) 4  MG tablet Take 4 mg by mouth every 8 (eight) hours as needed for nausea or vomiting.  Marland Kitchen oxyCODONE-acetaminophen (PERCOCET) 10-325 MG tablet Take 1 tablet by mouth every 6 (six) hours as needed for pain.  . Zinc 50 MG TABS Take 50 mg by mouth daily.   No current facility-administered medications for this visit. (Other)      REVIEW OF SYSTEMS:    ALLERGIES No Known Allergies  PAST MEDICAL HISTORY Past Medical History:  Diagnosis Date  . Avascular necrosis of left femoral head (HCC)   . Erectile dysfunction   . Gastritis and duodenitis   . GERD (gastroesophageal reflux disease)   . Left epiretinal membrane 06/15/2020   Vitrectomy, membrane peel for macular pucker with macular retina schisis-       09-16-2020  . Low testosterone   . Mental disorder   . Mixed restrictive and obstructive lung disease (HCC)   . Osteoarthrosis, unspecified whether generalized or localized, unspecified site   . Pain in joint, site unspecified   . Physical exam, annual 02/18/2010  . Sickle cell anemia (HCC)   . Tobacco use    Past Surgical History:  Procedure Laterality Date  . ELECTROCARDIOGRAM  01/31/2011  . ESOPHAGOGASTRODUODENOSCOPY  01/03/2013   Procedure: ESOPHAGOGASTRODUODENOSCOPY (EGD);  Surgeon: Meryl Dare, MD,FACG;  Location: [facility];  Service: Endoscopy;  Laterality: N/A;  .  EYE SURGERY Left 09/16/2020   Membrane Peel and Vitrectomy, Dr. Luciana Axe  . TOTAL SHOULDER ARTHROPLASTY Left 03/18/2020   Procedure: TOTAL SHOULDER ARTHROPLASTY;  Surgeon: Bjorn Pippin, MD;  Location: [facility];  Service: Orthopedics;  Laterality: Left;  . UPPER GASTROINTESTINAL ENDOSCOPY  11/18/2010    FAMILY HISTORY History reviewed. No pertinent family history.  SOCIAL HISTORY Social History   Tobacco Use  . Smoking status: Former Games developer  . Smokeless tobacco: Never Used  Vaping Use  . Vaping Use: Never used  Substance Use Topics  . Alcohol use: Yes    Alcohol/week: 0.0 standard drinks    Comment:  OCCASIONAL  . Drug use: No         OPHTHALMIC EXAM: Base Eye Exam    Visual Acuity (ETDRS)      Right Left   Dist [place] 20/30 +1 20/50 -1   Dist ph [place] 20/20 20/30 -2       Tonometry (Tonopen, 8:44 AM)      Right Left   Pressure 17 20       Pupils      Pupils Dark Light Shape React APD   Right PERRL 1 1 Round Minimal None   Left PERRL 1 1 Round Minimal None       Visual Fields (Counting fingers)      Left Right    Full Full       Extraocular Movement      Right Left    Full Full       Neuro/Psych    Oriented x3: Yes   Mood/Affect: Normal       Dilation    Left eye: 1.0% Mydriacyl, 2.5% Phenylephrine @ 8:48 AM        Slit Lamp and Fundus Exam    External Exam      Right Left   External Normal Normal       Slit Lamp Exam      Right Left   Lids/Lashes Normal Normal   Conjunctiva/Sclera White and quiet White and quiet   Cornea Clear Clear   Anterior Chamber Deep and quiet Deep and quiet   Iris Round and reactive Round and reactive   Lens  2+ Nuclear sclerosis   Anterior Vitreous Normal Normal       Fundus Exam      Right Left   Posterior Vitreous  Clear view, vitrectomized   Disc  Normal   C/D Ratio  0.2   Macula  Normal, much less topographic distortion   Vessels  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 Scan locations included subfoveal. Central Foveal Thickness: 281. Progression has been stable. Findings include normal foveal contour.   Left Eye Quality was good. Scan locations included subfoveal. Central Foveal Thickness: 449. Progression has improved. Findings include abnormal foveal contour.   Notes OS, improved foveal contour with much  less retinal listhesis in the macular region status post vitrectomy and membrane peel and release of vitreal macular traction.                ASSESSMENT/PLAN:  Postop check Patient asked to continue on topical therapy for only 2 more weeks.  If the medications topically are complete prior to that time in the left eye, no refills are required.  Vitreomacular adhesion of left eye 1 week post vitrectomy, vastly improved  Macular retinoschisis of left eye 1 week post vitrectomy membrane peel, vastly improved  Left epiretinal membrane Technically resolved, status post removal 1 week previous      ICD-10-CM   1. Left epiretinal membrane  H35.372 OCT, Retina - OU - Both Eyes  2. Postop check  Z09   3. Vitreomacular adhesion of left eye  H43.822   4. 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  This looks great status post resection of severe epiretinal membrane with foveal macular schisis, visual loss.  Acuity is already improving.  2.  Follow-up in 10 weeks for repeat OCT  3.  Patient may resume full activity in 5 more days, with no restrictions other than do not mash compress or rub the.  Ophthalmic Meds Ordered this visit:  No orders of the defined types were placed in this encounter.      Return in about 10 weeks (around 12/03/2020) for dilate, COLOR FP, OCT, OS, POST OP.  There are no Patient Instructions on file for this visit.   Explained the diagnoses, plan, and follow up with the patient and they expressed understanding.  Patient expressed understanding of the importance of proper follow up care.   Alford Highland Helmi Hechavarria M.D. Diseases & Surgery of the Retina and Vitreous [facility] 09/24/20     Abbreviations: M myopia (nearsighted); A astigmatism; H hyperopia (farsighted); P presbyopia; Mrx spectacle prescription;  CTL contact lenses; OD right eye; OS left eye; OU both eyes  XT exotropia; ET esotropia; PEK punctate epithelial keratitis; PEE  punctate epithelial erosions; DES dry eye syndrome; MGD meibomian gland dysfunction; ATs artificial tears; PFAT's preservative free artificial tears; NSC nuclear sclerotic cataract; PSC posterior subcapsular cataract; ERM epi-retinal membrane; PVD posterior vitreous detachment; RD retinal detachment; DM diabetes mellitus; DR diabetic retinopathy; NPDR non-proliferative diabetic retinopathy; PDR proliferative diabetic retinopathy; CSME clinically significant macular edema; DME diabetic macular edema; dbh dot blot hemorrhages; CWS cotton wool spot; POAG primary open angle glaucoma; C/D cup-to-disc ratio; HVF humphrey visual field; GVF goldmann visual field; OCT optical coherence tomography; IOP intraocular pressure; BRVO Branch retinal vein occlusion; CRVO central retinal vein occlusion; CRAO central retinal artery occlusion; BRAO branch retinal artery occlusion; RT retinal tear; SB scleral buckle; PPV pars plana vitrectomy; VH Vitreous hemorrhage; PRP panretinal laser photocoagulation; IVK intravitreal kenalog; VMT vitreomacular traction; MH Macular hole;  NVD neovascularization of the disc; NVE neovascularization elsewhere; AREDS age related eye disease study; ARMD age related macular degeneration; POAG primary open angle glaucoma; EBMD epithelial/anterior basement membrane dystrophy; ACIOL anterior chamber intraocular lens; IOL intraocular lens; PCIOL posterior chamber intraocular lens; Phaco/IOL phacoemulsification with intraocular lens placement; PRK photorefractive keratectomy; LASIK laser assisted in situ keratomileusis; HTN hypertension; DM diabetes mellitus; COPD chronic obstructive pulmonary disease

## 2020-12-01 ENCOUNTER — Ambulatory Visit
Admission: RE | Admit: 2020-12-01 | Discharge: 2020-12-01 | Disposition: A | Discharge status: Home / Self Care | Payer: BC Managed Care – PPO | Source: Ambulatory Visit | Attending: Internal Medicine | Admitting: Internal Medicine

## 2020-12-01 ENCOUNTER — Other Ambulatory Visit: Payer: Self-pay | Admitting: Internal Medicine

## 2020-12-01 DIAGNOSIS — D5700 Hb-SS disease with crisis, unspecified: Secondary | ICD-10-CM

## 2020-12-01 DIAGNOSIS — M549 Dorsalgia, unspecified: Secondary | ICD-10-CM

## 2020-12-03 ENCOUNTER — Other Ambulatory Visit: Payer: Self-pay

## 2020-12-03 ENCOUNTER — Encounter (INDEPENDENT_AMBULATORY_CARE_PROVIDER_SITE_OTHER): Payer: Self-pay | Admitting: Ophthalmology

## 2020-12-03 ENCOUNTER — Ambulatory Visit (INDEPENDENT_AMBULATORY_CARE_PROVIDER_SITE_OTHER): Payer: BC Managed Care – PPO | Admitting: Ophthalmology

## 2020-12-03 DIAGNOSIS — H3522 Other non-diabetic proliferative retinopathy, left eye: Secondary | ICD-10-CM

## 2020-12-03 DIAGNOSIS — H2512 Age-related nuclear cataract, left eye: Secondary | ICD-10-CM

## 2020-12-03 DIAGNOSIS — Z09 Encounter for follow-up examination after completed treatment for conditions other than malignant neoplasm: Secondary | ICD-10-CM

## 2020-12-03 DIAGNOSIS — H33102 Unspecified retinoschisis, left eye: Secondary | ICD-10-CM

## 2020-12-03 DIAGNOSIS — H43822 Vitreomacular adhesion, left eye: Secondary | ICD-10-CM

## 2020-12-03 NOTE — Assessment & Plan Note (Signed)
Quiescent disease status post resection

## 2020-12-03 NOTE — Assessment & Plan Note (Signed)
Vastly improved status post vitrectomy membrane peel some 10 weeks previous left eye, acuity baseline 20/100 now 20/50

## 2020-12-03 NOTE — Progress Notes (Signed)
12/03/2020     CHIEF COMPLAINT Patient presents for Post-op Follow-up (26 WK PO OS///Pt reports stable vision, no new f/f, no pain or pressure. )   HISTORY OF PRESENT ILLNESS: [name] is a 50 y.o. male who presents to the clinic today for:   HPI    Post-op Follow-up    In left eye.  Discomfort includes none.  Vision is stable. Additional comments: 10 WK PO OS   Pt reports stable vision, no new f/f, no pain or pressure.        Last edited by Nichola Sizer D on 12/03/2020 10:06 AM. (History)      Referring physician: Rogers Blocker, MD [place],  [place] [id number]  HISTORICAL INFORMATION:   Selected notes from the MEDICAL RECORD NUMBER       CURRENT MEDICATIONS: Current Outpatient Medications (Ophthalmic Drugs)  Medication Sig  . prednisoLONE acetate (PRED FORTE) 1 % ophthalmic suspension Place 1 drop into the left eye 4 (four) times daily. (Patient not taking: Reported on 12/03/2020)   No current facility-administered medications for this visit. (Ophthalmic Drugs)   Current Outpatient Medications (Other)  Medication Sig  . acetaminophen (TYLENOL) 500 MG tablet Take 1,000 mg by mouth every 4 (four) hours as needed for mild pain.  . Cholecalciferol (VITAMIN D3) 250 MCG (10000 UT) TABS Take 10,000 Units by mouth daily.  Marland Kitchen enoxaparin (LOVENOX) 40 MG/0.4ML injection Inject 0.4 mLs (40 mg total) into the skin daily.  . ferrous sulfate 325 (65 FE) MG tablet Take 325 mg by mouth daily with breakfast.  . folic acid (FOLVITE) 867 MCG tablet Take 400 mcg by mouth daily.  . methocarbamol (ROBAXIN) 500 MG tablet Take 1 tablet (500 mg total) by mouth every 8 (eight) hours as needed for muscle spasms. (Patient not taking: Reported on 04/10/2020)  . Omega-3 Fatty Acids (FISH OIL) 1000 MG CAPS Take 1,000 mg by mouth daily.  . ondansetron (ZOFRAN) 4 MG tablet Take 4 mg by mouth every 8 (eight) hours as needed for nausea or vomiting.  Marland Kitchen  oxyCODONE-acetaminophen (PERCOCET) 10-325 MG tablet Take 1 tablet by mouth every 6 (six) hours as needed for pain.  . Zinc 50 MG TABS Take 50 mg by mouth daily.   No current facility-administered medications for this visit. (Other)      REVIEW OF SYSTEMS:    ALLERGIES No Known Allergies  PAST MEDICAL HISTORY Past Medical History:  Diagnosis Date  . Avascular necrosis of left femoral head ([place])   . Erectile dysfunction   . Gastritis and duodenitis   . GERD (gastroesophageal reflux disease)   . Left epiretinal membrane 06/15/2020   Vitrectomy, membrane peel for macular pucker with macular retina schisis-       09-16-2020  . Low testosterone   . Mental disorder   . Mixed restrictive and obstructive lung disease ([place])   . Osteoarthrosis, unspecified whether generalized or localized, unspecified site   . Pain in joint, site unspecified   . Physical exam, annual 02/18/2010  . Sickle cell anemia (HCC)   . Tobacco use    Past Surgical History:  Procedure Laterality Date  . ELECTROCARDIOGRAM  01/31/2011  . ESOPHAGOGASTRODUODENOSCOPY  01/03/2013   Procedure: ESOPHAGOGASTRODUODENOSCOPY (EGD);  Surgeon: Ladene Artist, MD,FACG;  Location: [facility];  Service: Endoscopy;  Laterality: N/A;  . EYE SURGERY Left 09/16/2020   Membrane Peel and Vitrectomy, Dr. Zadie Rhine  . TOTAL SHOULDER ARTHROPLASTY Left 03/18/2020   Procedure: TOTAL SHOULDER  ARTHROPLASTY;  Surgeon: Bjorn Pippin, MD;  Location: [facility];  Service: Orthopedics;  Laterality: Left;  . UPPER GASTROINTESTINAL ENDOSCOPY  11/18/2010    FAMILY HISTORY No family history on file.  SOCIAL HISTORY Social History   Tobacco Use  . Smoking status: Former Games developer  . Smokeless tobacco: Never Used  Vaping Use  . Vaping Use: Never used  Substance Use Topics  . Alcohol use: Yes    Alcohol/week: 0.0 standard drinks    Comment: OCCASIONAL  . Drug use: No         OPHTHALMIC EXAM:  Base Eye Exam    Visual Acuity (ETDRS)       Right Left   Dist [place] 20/25 -2 20/50 +1   Dist ph [place]  20/40 -2       Tonometry (Tonopen, 10:11 AM)      Right Left   Pressure 15 19       Pupils      Pupils Dark Light Shape React APD   Right PERRL 1 1 Round Minimal None   Left PERRL 1 1 Round Minimal None       Visual Fields (Counting fingers)      Left Right    Full Full       Extraocular Movement      Right Left    Full Full       Neuro/Psych    Oriented x3: Yes   Mood/Affect: Normal       Dilation    Left eye: 1.0% Mydriacyl, 2.5% Phenylephrine @ 10:11 AM        Slit Lamp and Fundus Exam    External Exam      Right Left   External Normal Normal       Slit Lamp Exam      Right Left   Lids/Lashes Normal Normal   Conjunctiva/Sclera White and quiet White and quiet   Cornea Clear Clear   Anterior Chamber Deep and quiet Deep and quiet   Iris Round and reactive Round and reactive   Lens  1+ Nuclear sclerosis, no central nuclear sclerosis out of proportion to the cortex   Anterior Vitreous Normal Normal       Fundus Exam      Right Left   Posterior Vitreous  Clear view, vitrectomized   Disc  Normal   C/D Ratio  0.25   Macula  Normal, much less topographic distortion   Vessels  Hemoglobin [place] seafan retinopathy present peripherally, active temporally   Periphery  Seafan retinopathy similarly present, change in peripheral fibrosis, good PRP peripherally, no active neovascularization          IMAGING AND PROCEDURES  Imaging and Procedures for 12/03/20  OCT, Retina - OU - Both Eyes       Right Eye Quality was good. Scan locations included subfoveal. Central Foveal Thickness: 287. Progression has been stable. Findings include normal foveal contour.   Left Eye Quality was good. Scan locations included subfoveal. Central Foveal Thickness: 350. Progression has improved. Findings include abnormal foveal contour.   Notes OS, anatomy of the macula vastly improved status post vitrectomy membrane peel  release of vitreal macular traction and removal of internal limiting membrane some 10 weeks prior       Color Fundus Photography Optos - OU - Both Eyes       Right Eye Progression has been stable. Disc findings include normal observations. Macula : normal observations. Vessels : Neovascularization. Periphery : neovascularization.  Left Eye Progression has improved. Disc findings include normal observations. Macula : normal observations. Vessels : Neovascularization. Periphery : neovascularization.   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  Fibrovascular tufts persistent temporally OD, no traction, no progression  OS status post vitrectomy membrane peel for severe epiretinal membrane, vitreomacular traction syndrome, secondary to nondiabetic proliferative retinopathy, sickle cell hemoglobinopathy.                ASSESSMENT/PLAN:  Macular retinoschisis of left eye Vastly improved status post vitrectomy membrane peel some 10 weeks previous left eye, acuity baseline 20/100 now 20/50  Nondiabetic proliferative retinopathy of left eye Quiescent disease status post resection  Nuclear sclerotic cataract of left eye I discussed with the patient that cataract progression is likely in his age group sometime between now in 5 years because of the procedure done in the way the oxygen levels in the eye change leading to progressive cataractous changes.  This cannot be halted.  I reassured the patient that routine cataract surgery would be in his future most likely left eye in the coming decades      ICD-10-CM   1. Postop check  Z09 OCT, Retina - OU - Both Eyes    Color Fundus Photography Optos - OU - Both Eyes  2. Macular retinoschisis of left eye  H33.102   3. Nondiabetic proliferative retinopathy of left eye  H35.22   4. Vitreomacular adhesion of left eye  H43.822   5. Nuclear sclerotic cataract of left eye  H25.12      1.  2.  3.  Ophthalmic Meds Ordered this visit:  No orders of the defined types were placed in this encounter.      Return in about 1 month (around 01/03/2021) for COLOR FP, DILATE OU, OCT.  There are no Patient Instructions on file for this visit.   Explained the diagnoses, plan, and follow up with the patient and they expressed understanding.  Patient expressed understanding of the importance of proper follow up care.   Alford Highland Chenell Lozon M.D. Diseases & Surgery of the Retina and Vitreous [facility] 12/03/20     Abbreviations: M myopia (nearsighted); A astigmatism; H hyperopia (farsighted); P presbyopia; Mrx spectacle prescription;  CTL contact lenses; OD right eye; OS left eye; OU both eyes  XT exotropia; ET esotropia; PEK punctate epithelial keratitis; PEE punctate epithelial erosions; DES dry eye syndrome; MGD meibomian gland dysfunction; ATs artificial tears; PFAT's preservative free artificial tears; NSC nuclear sclerotic cataract; PSC posterior subcapsular cataract; ERM epi-retinal membrane; PVD posterior vitreous detachment; RD retinal detachment; DM diabetes mellitus; DR diabetic retinopathy; NPDR non-proliferative diabetic retinopathy; PDR proliferative diabetic retinopathy; CSME clinically significant macular edema; DME diabetic macular edema; dbh dot blot hemorrhages; CWS cotton wool spot; POAG primary open angle glaucoma; C/D cup-to-disc ratio; HVF humphrey visual field; GVF goldmann visual field; OCT optical coherence tomography; IOP intraocular pressure; BRVO Branch retinal vein occlusion; CRVO central retinal vein occlusion; CRAO central retinal artery occlusion; BRAO branch retinal artery occlusion; RT retinal tear; SB scleral buckle; PPV pars plana vitrectomy; VH Vitreous hemorrhage; PRP panretinal laser photocoagulation; IVK intravitreal kenalog; VMT vitreomacular traction; MH Macular hole;  NVD neovascularization of the disc; NVE neovascularization  elsewhere; AREDS age related eye disease study; ARMD age related macular degeneration; POAG primary open angle glaucoma; EBMD epithelial/anterior basement membrane dystrophy; ACIOL anterior chamber intraocular lens; IOL intraocular lens; PCIOL posterior chamber intraocular lens; Phaco/IOL phacoemulsification with intraocular lens placement; PRK photorefractive keratectomy; LASIK  laser assisted in situ keratomileusis; HTN hypertension; DM diabetes mellitus; COPD chronic obstructive pulmonary disease

## 2020-12-03 NOTE — Assessment & Plan Note (Signed)
I discussed with the patient that cataract progression is likely in his age group sometime between now in 5 years because of the procedure done in the way the oxygen levels in the eye change leading to progressive cataractous changes.  This cannot be halted.  I reassured the patient that routine cataract surgery would be in his future most likely left eye in the coming decades

## 2021-01-04 ENCOUNTER — Encounter (INDEPENDENT_AMBULATORY_CARE_PROVIDER_SITE_OTHER): Payer: BC Managed Care – PPO | Admitting: Ophthalmology

## 2021-01-11 ENCOUNTER — Encounter (INDEPENDENT_AMBULATORY_CARE_PROVIDER_SITE_OTHER): Payer: BC Managed Care – PPO | Admitting: Ophthalmology

## 2021-02-01 ENCOUNTER — Other Ambulatory Visit: Payer: Self-pay

## 2021-02-01 ENCOUNTER — Other Ambulatory Visit (HOSPITAL_COMMUNITY): Payer: Self-pay | Admitting: Internal Medicine

## 2021-02-01 ENCOUNTER — Ambulatory Visit (HOSPITAL_COMMUNITY)
Admission: RE | Admit: 2021-02-01 | Discharge: 2021-02-01 | Disposition: A | Discharge status: Home / Self Care | Payer: BC Managed Care – PPO | Source: Ambulatory Visit | Attending: Internal Medicine | Admitting: Internal Medicine

## 2021-02-01 DIAGNOSIS — M79605 Pain in left leg: Secondary | ICD-10-CM | POA: Diagnosis not present

## 2021-06-07 ENCOUNTER — Telehealth (HOSPITAL_COMMUNITY): Payer: Self-pay | Admitting: *Deleted

## 2021-06-07 NOTE — Telephone Encounter (Signed)
Patient called requesting to come to the day hospital for sickle cell pain. Due to staffing, the day hospital can not accept additional patients. Patient reports that he is out of pain medications and Dr. August Saucer prescribes for him. Advised patient to contact Dr. Diamantina [place] office for refill on home pain medication.  Also advised patient to call back to the day hospital in the morning for triage.  Patient expresses an understanding.

## 2021-06-08 ENCOUNTER — Telehealth (HOSPITAL_COMMUNITY): Payer: Self-pay

## 2021-06-08 ENCOUNTER — Non-Acute Institutional Stay (HOSPITAL_COMMUNITY)
Admission: AD | Admit: 2021-06-08 | Discharge: 2021-06-08 | Disposition: A | Discharge status: Home / Self Care | Payer: BC Managed Care – PPO | Source: Ambulatory Visit | Attending: Internal Medicine | Admitting: Internal Medicine

## 2021-06-08 ENCOUNTER — Encounter (HOSPITAL_COMMUNITY): Payer: Self-pay | Admitting: Family Medicine

## 2021-06-08 DIAGNOSIS — M79606 Pain in leg, unspecified: Secondary | ICD-10-CM | POA: Insufficient documentation

## 2021-06-08 DIAGNOSIS — G894 Chronic pain syndrome: Secondary | ICD-10-CM | POA: Diagnosis not present

## 2021-06-08 DIAGNOSIS — D638 Anemia in other chronic diseases classified elsewhere: Secondary | ICD-10-CM | POA: Diagnosis not present

## 2021-06-08 DIAGNOSIS — D5700 Hb-SS disease with crisis, unspecified: Secondary | ICD-10-CM | POA: Diagnosis present

## 2021-06-08 DIAGNOSIS — Z96612 Presence of left artificial shoulder joint: Secondary | ICD-10-CM | POA: Insufficient documentation

## 2021-06-08 DIAGNOSIS — Z87891 Personal history of nicotine dependence: Secondary | ICD-10-CM | POA: Diagnosis not present

## 2021-06-08 LAB — COMPREHENSIVE METABOLIC PANEL
ALT: 13 U/L (ref 0–44)
AST: 18 U/L (ref 15–41)
Albumin: 4.7 g/dL (ref 3.5–5.0)
Alkaline Phosphatase: 57 U/L (ref 38–126)
Anion gap: 9 (ref 5–15)
BUN: 9 mg/dL (ref 6–20)
CO2: 25 mmol/L (ref 22–32)
Calcium: 9.2 mg/dL (ref 8.9–10.3)
Chloride: 106 mmol/L (ref 98–111)
Creatinine, Ser: 0.84 mg/dL (ref 0.61–1.24)
GFR, Estimated: >60 mL/min (ref >60)
Glucose, Bld: 99 mg/dL (ref 70–99)
Potassium: 3.8 mmol/L (ref 3.5–5.1)
Sodium: 140 mmol/L (ref 135–145)
Total Bilirubin: 1.5 mg/dL — ABNORMAL HIGH (ref 0.3–1.2)
Total Protein: 7.8 g/dL (ref 6.5–8.1)

## 2021-06-08 LAB — CBC
HCT: 27.2 % — ABNORMAL LOW (ref 39.0–52.0)
Hemoglobin: 9.4 g/dL — ABNORMAL LOW (ref 13.0–17.0)
MCH: 26.2 pg (ref 26.0–34.0)
MCHC: 34.6 g/dL (ref 30.0–36.0)
MCV: 75.8 fL — ABNORMAL LOW (ref 80.0–100.0)
Platelets: 118 10*3/uL — ABNORMAL LOW (ref 150–400)
RBC: 3.59 MIL/uL — ABNORMAL LOW (ref 4.22–5.81)
RDW: 16.6 % — ABNORMAL HIGH (ref 11.5–15.5)
WBC: 4.8 10*3/uL (ref 4.0–10.5)
nRBC: 0 % (ref 0.0–0.2)

## 2021-06-08 LAB — DIFFERENTIAL
Abs Immature Granulocytes: 0.02 10*3/uL (ref 0.00–0.07)
Basophils Absolute: 0 10*3/uL (ref 0.0–0.1)
Basophils Relative: 0 %
Eosinophils Absolute: 0.1 10*3/uL (ref 0.0–0.5)
Eosinophils Relative: 1 %
Immature Granulocytes: 0 %
Lymphocytes Relative: 16 %
Lymphs Abs: 0.8 10*3/uL (ref 0.7–4.0)
Monocytes Absolute: 0.4 10*3/uL (ref 0.1–1.0)
Monocytes Relative: 7 %
Neutro Abs: 3.6 10*3/uL (ref 1.7–7.7)
Neutrophils Relative %: 76 %

## 2021-06-08 LAB — RETICULOCYTES
Immature Retic Fract: 32 % — ABNORMAL HIGH (ref 2.3–15.9)
RBC.: 4 MIL/uL — ABNORMAL LOW (ref 4.22–5.81)
Retic Count, Absolute: 155.2 10*3/uL (ref 19.0–186.0)
Retic Ct Pct: 3.9 % — ABNORMAL HIGH (ref 0.4–3.1)

## 2021-06-08 MED ORDER — SODIUM CHLORIDE 0.45 % IV SOLN: INTRAVENOUS | Status: DC

## 2021-06-08 MED ORDER — ACETAMINOPHEN 500 MG PO TABS
1000.0000 mg | ORAL_TABLET | Freq: Once | ORAL | Status: AC
  Administered 2021-06-08: 1000 mg via ORAL
  Filled 2021-06-08: qty 2

## 2021-06-08 MED ORDER — OXYCODONE HCL 5 MG PO TABS
10.0000 mg | ORAL_TABLET | Freq: Once | ORAL | Status: AC
  Administered 2021-06-08: 10 mg via ORAL
  Filled 2021-06-08: qty 2

## 2021-06-08 MED ORDER — DIPHENHYDRAMINE HCL 12.5 MG/5ML PO ELIX: 12.5000 mg | ORAL_SOLUTION | Freq: Four times a day (QID) | ORAL | Status: DC | PRN

## 2021-06-08 MED ORDER — ONDANSETRON HCL 4 MG/2ML IJ SOLN: 4.0000 mg | Freq: Four times a day (QID) | INTRAMUSCULAR | Status: DC | PRN

## 2021-06-08 MED ORDER — KETOROLAC TROMETHAMINE 30 MG/ML IJ SOLN
15.0000 mg | Freq: Once | INTRAMUSCULAR | Status: AC
  Administered 2021-06-08: 15 mg via INTRAVENOUS
  Filled 2021-06-08: qty 1

## 2021-06-08 MED ORDER — SODIUM CHLORIDE 0.9% FLUSH: 9.0000 mL | INTRAVENOUS | Status: DC | PRN

## 2021-06-08 MED ORDER — HYDROMORPHONE 1 MG/ML IV SOLN
INTRAVENOUS | Status: DC
  Administered 2021-06-08: 5.9 mg via INTRAVENOUS
  Filled 2021-06-08: qty 30

## 2021-06-08 MED ORDER — NALOXONE HCL 0.4 MG/ML IJ SOLN: 0.4000 mg | INTRAMUSCULAR | Status: DC | PRN

## 2021-06-08 NOTE — Progress Notes (Signed)
Patient admitted to the day infusion hospital for sickle cell pain. Initially, patient reported bilateral leg pain rated 10/10. For pain management, patient placed on Dilaudid PCA, given Tylenol, Toradol and hydrated with IV fluids. At discharge, patient rated pain at 5/10. Vital signs stable. Discharge instructions given. Patient alert, oriented and ambulatory at discharge.

## 2021-06-08 NOTE — H&P (Signed)
[facility] History and Physical   Date: 06/08/2021  Patient name: [name] Medical record number: [id number] Date of birth: 1970-12-27 Age: 50 y.o. Gender: male PCP: Gwenyth Bender, MD  Attending physician: Quentin Angst, MD  Chief Complaint: Sickle cell pain   History of Present Illness: [name] is a 50 year old male with a medical history significant for sickle cell disease, chronic pain syndrome, and history of anemia of chronic disease presents with complaints of low back and lower extremity pain.  He says the pain is consistent with his typical sickle cell crisis.  Pain intensity has been elevated over the past 4 days and unrelieved by home medications.  He last had Percocet 10-3 25 this a.m. without sustained relief.  He characterizes pain as constant and aching.  He has not identified any provocative factors concerning crisis.  He rates his pain at 10/10.  He denies headache, fever, chills, chest pain, shortness of breath, urinary symptoms, nausea, vomiting, or diarrhea.  No sick contacts, recent travel, or exposure to COVID-19.  Meds: Medications Prior to Admission  Medication Sig Dispense Refill Last Dose   acetaminophen (TYLENOL) 500 MG tablet Take 1,000 mg by mouth every 4 (four) hours as needed for mild pain.   06/08/2021   Cholecalciferol (VITAMIN D3) 250 MCG (10000 UT) TABS Take 10,000 Units by mouth daily.   06/07/2021   ferrous sulfate 325 (65 FE) MG tablet Take 325 mg by mouth daily with breakfast.   06/07/2021   folic acid (FOLVITE) 400 MCG tablet Take 400 mcg by mouth daily.   06/07/2021   Omega-3 Fatty Acids (FISH OIL) 1000 MG CAPS Take 1,000 mg by mouth daily.   06/07/2021   oxyCODONE-acetaminophen (PERCOCET) 10-325 MG tablet Take 1 tablet by mouth every 6 (six) hours as needed for pain.   06/07/2021   Zinc 50 MG TABS Take 50 mg by mouth daily.   06/07/2021   enoxaparin (LOVENOX) 40 MG/0.4ML injection Inject 0.4 mLs (40 mg  total) into the skin daily. 12 mL 0    methocarbamol (ROBAXIN) 500 MG tablet Take 1 tablet (500 mg total) by mouth every 8 (eight) hours as needed for muscle spasms. (Patient not taking: Reported on 04/10/2020) 20 tablet 0    ondansetron (ZOFRAN) 4 MG tablet Take 4 mg by mouth every 8 (eight) hours as needed for nausea or vomiting.   More than a month   prednisoLONE acetate (PRED FORTE) 1 % ophthalmic suspension Place 1 drop into the left eye 4 (four) times daily. (Patient not taking: Reported on 12/03/2020) 10 mL 0     Allergies: Patient has no known allergies. 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-       09-16-2020   Low testosterone    Mental disorder    Mixed restrictive and obstructive lung disease (HCC)    Osteoarthrosis, unspecified whether generalized or localized, unspecified site    Pain in joint, site unspecified    Physical exam, annual 02/18/2010   Sickle cell anemia (HCC)    Tobacco use    Past Surgical History:  Procedure Laterality Date   ELECTROCARDIOGRAM  01/31/2011   ESOPHAGOGASTRODUODENOSCOPY  01/03/2013   Procedure: ESOPHAGOGASTRODUODENOSCOPY (EGD);  Surgeon: Judie Petit  Dellie Burns, MD,FACG;  Location: [facility];  Service: Endoscopy;  Laterality: N/A;   EYE SURGERY Left 09/16/2020   Membrane Peel and Vitrectomy, Dr. Luciana Axe   TOTAL SHOULDER ARTHROPLASTY Left 03/18/2020   Procedure: TOTAL SHOULDER ARTHROPLASTY;  Surgeon: Bjorn Pippin, MD;  Location: [facility];  Service: Orthopedics;  Laterality: Left;   UPPER GASTROINTESTINAL ENDOSCOPY  11/18/2010   No family history on file. Social History   Socioeconomic History   Marital status: Married    Spouse name: Not on file   Number of children: Not on file   Years of  education: Not on file   Highest education level: Not on file  Occupational History   Not on file  Tobacco Use   Smoking status: Former    Pack years: 0.00   Smokeless tobacco: Never  Vaping Use   Vaping Use: Never used  Substance and Sexual Activity   Alcohol use: Yes    Alcohol/week: 0.0 standard drinks    Comment: OCCASIONAL   Drug use: No   Sexual activity: Never  Other Topics Concern   Not on file  Social History Narrative   Not on file   Social Determinants of Health   Financial Resource Strain: Not on file  Food Insecurity: Not on file  Transportation Needs: Not on file  Physical Activity: Not on file  Stress: Not on file  Social Connections: Not on file  Intimate Partner Violence: Not on file   Review of Systems  Constitutional:  Negative for chills and fever.  HENT: Negative.    Eyes: Negative.   Respiratory: Negative.    Cardiovascular: Negative.   Gastrointestinal: Negative.   Genitourinary: Negative.   Musculoskeletal:  Positive for back pain and joint pain.  Skin: Negative.   Neurological: Negative.   Psychiatric/Behavioral: Negative.     Physical Exam: Blood pressure 121/78, pulse 86, temperature 98.1 F (36.7 C), temperature source Temporal, resp. rate 18, SpO2 98 %. Physical Exam Constitutional:      Appearance: Normal appearance.  Eyes:     Pupils: Pupils are equal, round, and reactive to light.  Cardiovascular:     Rate and Rhythm: Normal rate and regular rhythm.     Pulses: Normal pulses.  Pulmonary:     Effort: Pulmonary effort is normal.  Abdominal:     General: Bowel sounds are normal.  Musculoskeletal:        General: Normal range of motion.  Skin:    General: Skin is warm.  Neurological:     General: No focal deficit present.     Mental Status: He is alert. Mental status is at baseline.  Psychiatric:        Mood and Affect: Mood normal.        Behavior: Behavior normal.        Thought Content: Thought content normal.         Judgment: Judgment normal.     Lab results: Results for orders placed or performed during the hospital encounter of 06/08/21 (from the past 24 hour(s))  Comprehensive metabolic panel     Status: Abnormal   Collection Time: 06/08/21  9:30 AM  Result Value Ref Range   Sodium 140 135 - 145 mmol/L   Potassium 3.8 3.5 - 5.1 mmol/L   Chloride 106 98 - 111 mmol/L   CO2 25 22 - 32 mmol/L   Glucose, Bld 99 70 - 99 mg/dL   BUN 9 6 - 20 mg/dL   Creatinine, Ser 2.70 0.61 -  1.24 mg/dL   Calcium 9.2 8.9 - 38.7 mg/dL   Total Protein 7.8 6.5 - 8.1 g/dL   Albumin 4.7 3.5 - 5.0 g/dL   AST 18 15 - 41 U/L   ALT 13 0 - 44 U/L   Alkaline Phosphatase 57 38 - 126 U/L   Total Bilirubin 1.5 (H) 0.3 - 1.2 mg/dL   GFR, Estimated >56 >43 mL/min   Anion gap 9 5 - 15  Reticulocytes     Status: Abnormal   Collection Time: 06/08/21  9:30 AM  Result Value Ref Range   Retic Ct Pct 3.9 (H) 0.4 - 3.1 %   RBC. 4.00 (L) 4.22 - 5.81 MIL/uL   Retic Count, Absolute 155.2 19.0 - 186.0 K/uL   Immature Retic Fract 32.0 (H) 2.3 - 15.9 %  CBC     Status: Abnormal   Collection Time: 06/08/21 11:45 AM  Result Value Ref Range   WBC 4.8 4.0 - 10.5 K/uL   RBC 3.59 (L) 4.22 - 5.81 MIL/uL   Hemoglobin 9.4 (L) 13.0 - 17.0 g/dL   HCT 32.9 (L) 51.8 - 84.1 %   MCV 75.8 (L) 80.0 - 100.0 fL   MCH 26.2 26.0 - 34.0 pg   MCHC 34.6 30.0 - 36.0 g/dL   RDW 66.0 (H) 63.0 - 16.0 %   Platelets 118 (L) 150 - 400 K/uL   nRBC 0.0 0.0 - 0.2 %  Differential     Status: None   Collection Time: 06/08/21 11:45 AM  Result Value Ref Range   Neutrophils Relative % 76 %   Neutro Abs 3.6 1.7 - 7.7 K/uL   Lymphocytes Relative 16 %   Lymphs Abs 0.8 0.7 - 4.0 K/uL   Monocytes Relative 7 %   Monocytes Absolute 0.4 0.1 - 1.0 K/uL   Eosinophils Relative 1 %   Eosinophils Absolute 0.1 0.0 - 0.5 K/uL   Basophils Relative 0 %   Basophils Absolute 0.0 0.0 - 0.1 K/uL   Immature Granulocytes 0 %   Abs Immature Granulocytes 0.02 0.00 - 0.07 K/uL     Imaging results:  No results found.   Assessment & Plan: Patient admitted to sickle cell day infusion center for management of pain crisis.  Patient is opiate tolerant Initiate IV dilaudid PCA. Settings of 0.5 mg, 10-minute lockout, and 3 mg/h IV fluids, 0.45% saline at 150 mL/h Toradol 15 mg IV times one dose Tylenol 1000 mg by mouth times one dose Review CBC with differential, complete metabolic panel, and reticulocytes as results become available. Pain intensity will be reevaluated in context of functioning and relationship to baseline as care progresses If pain intensity remains elevated and/or sudden change in hemodynamic stability transition to inpatient services for higher level of care.    Nolon Nations  APRN, MSN, FNP-C [facility] [address] [place], [place] [address] [phone]   06/08/2021, 2:59 PM

## 2021-06-08 NOTE — Discharge Summary (Addendum)
[facility] Discharge Summary   Patient ID: [name] MRN: [id number] DOB/AGE: [birth year] 50 y.o.  Admit date: 06/08/2021 Discharge date: 06/08/2021  Primary Care Physician:  Gwenyth Bender, MD  Admission Diagnoses:  Active Problems:   Sickle cell pain crisis [facility])   Discharge Medications:  Allergies as of 06/08/2021   No Known Allergies      Medication List     TAKE these medications    acetaminophen 500 MG tablet Commonly known as: TYLENOL Take 1,000 mg by mouth every 4 (four) hours as needed for mild pain.   enoxaparin 40 MG/0.4ML injection Commonly known as: LOVENOX Inject 0.4 mLs (40 mg total) into the skin daily.   ferrous sulfate 325 (65 FE) MG tablet Take 325 mg by mouth daily with breakfast.   Fish Oil 1000 MG Caps Take 1,000 mg by mouth daily.   folic acid 400 MCG tablet Commonly known as: FOLVITE Take 400 mcg by mouth daily.   ondansetron 4 MG tablet Commonly known as: ZOFRAN Take 4 mg by mouth every 8 (eight) hours as needed for nausea or vomiting.   oxyCODONE-acetaminophen 10-325 MG tablet Commonly known as: PERCOCET Take 1 tablet by mouth every 6 (six) hours as needed for pain.   Vitamin D3 250 MCG (10000 UT) Tabs Take 10,000 Units by mouth daily.   Zinc 50 MG Tabs Take 50 mg by mouth daily.       ASK your doctor about these medications    methocarbamol 500 MG tablet Commonly known as: Robaxin Take 1 tablet (500 mg total) by mouth every 8 (eight) hours as needed for muscle spasms.   prednisoLONE acetate 1 % ophthalmic suspension Commonly known as: PRED FORTE Place 1 drop into the left eye 4 (four) times daily.         Consults:  None  Significant Diagnostic Studies:  No results found.    History of present illness:  [name] is a 50 year old male with a medical history significant for sickle cell disease, chronic pain syndrome, and history of anemia of chronic disease presents  with complaints of low back and lower extremity pain.  He says the pain is consistent with his typical sickle cell crisis.  Pain intensity has been elevated over the past 4 days and unrelieved by home medications.  He last had Percocet 10-3 25 this a.m. without sustained relief.  He characterizes pain as constant and aching.  He has not identified any provocative factors concerning crisis.  He rates his pain at 10/10.  He denies headache, fever, chills, chest pain, shortness of breath, urinary symptoms, nausea, vomiting, or diarrhea.  No sick contacts, recent travel, or exposure to COVID-19.   [facility] Course: Patient admitted to sickle cell day infusion clinic for management of pain crisis. Reviewed all laboratory values, unremarkable. Pain managed with IV Dilaudid via PCA, IV fluids, 0.45% saline at 150 mL/h Oxycodone 10 mg x 1 Toradol 15 mg x 1 Tylenol 1000 mg x 1 Pain intensity decreased to 5/10.  Patient is requesting discharge home.  He is alert, oriented, and ambulating without assistance.  He will discharge home in a hemodynamically stable condition.   Discharge instructions: Resume all home medications.   Follow up with PCP as previously  scheduled.   Discussed the importance of drinking 64 ounces of water daily, dehydration of red blood cells may lead further sickling.   Avoid all stressors that precipitate sickle cell pain crisis.  The patient was given clear instructions to go to ER or return to medical center if symptoms do not improve, worsen or new problems develop.   Physical Exam at Discharge:  BP 138/87 (BP Location: Right Arm)   Pulse 88   Temp 98.1 F (36.7 C) (Temporal)   Resp 16   SpO2 100%  Physical Exam Constitutional:      Appearance: Normal appearance.  HENT:     Head: Normocephalic.  Eyes:     Pupils: Pupils are equal, round, and reactive to light.  Cardiovascular:     Rate and Rhythm: Normal rate and regular rhythm.     Pulses:  Normal pulses.  Pulmonary:     Effort: Pulmonary effort is normal.  Abdominal:     General: Bowel sounds are normal.  Musculoskeletal:        General: Normal range of motion.  Skin:    General: Skin is warm.  Neurological:     General: No focal deficit present.     Mental Status: Mental status is at baseline.  Psychiatric:        Mood and Affect: Mood normal.        Behavior: Behavior normal.        Thought Content: Thought content normal.     Disposition at Discharge: Discharge disposition: 01-Home or Self Care       Discharge Orders: Discharge Instructions     Discharge patient   Complete by: As directed    Discharge disposition: 01-Home or Self Care   Discharge patient date: 06/08/2021       Condition at Discharge:   Stable  Time spent on Discharge:  Greater than 30 minutes.  Signed: Nolon Nations  APRN, MSN, FNP-C Patient Care [facility] [address] [place], [place] [address] [phone]  06/08/2021, 4:01 PM

## 2021-06-08 NOTE — Telephone Encounter (Signed)
Patient called in. Complains of pain in legs rates 10/10. Denied chest pain, abd pain, fever, N/V/D, or priapism. Wants to come in for treatment. Last took Ibuprofen at 800mg  at midnight, pt states he does not take any other pain medication and does not have refills of any pain prescriptions. Pt states he has not gone to the ED recently. Pt states his wife is his transportation today. Denies exposure to anyone covid positive in last two weeks, denies any flu like symptoms today. FNP made aware, approved for pt to be seen in day hospital today. Pt notified, verbalized understanding.

## 2021-06-18 ENCOUNTER — Other Ambulatory Visit: Payer: Self-pay | Admitting: Orthopaedic Surgery

## 2021-06-18 DIAGNOSIS — M25522 Pain in left elbow: Secondary | ICD-10-CM

## 2021-07-08 ENCOUNTER — Ambulatory Visit
Admission: RE | Admit: 2021-07-08 | Discharge: 2021-07-08 | Disposition: A | Discharge status: Home / Self Care | Payer: BC Managed Care – PPO | Source: Ambulatory Visit | Attending: Orthopaedic Surgery | Admitting: Orthopaedic Surgery

## 2021-07-08 ENCOUNTER — Other Ambulatory Visit: Payer: Self-pay

## 2021-07-08 DIAGNOSIS — M25522 Pain in left elbow: Secondary | ICD-10-CM

## 2021-07-12 ENCOUNTER — Other Ambulatory Visit: Payer: Self-pay

## 2021-07-12 ENCOUNTER — Ambulatory Visit (INDEPENDENT_AMBULATORY_CARE_PROVIDER_SITE_OTHER): Payer: BC Managed Care – PPO | Admitting: Ophthalmology

## 2021-07-12 ENCOUNTER — Encounter (INDEPENDENT_AMBULATORY_CARE_PROVIDER_SITE_OTHER): Payer: Self-pay | Admitting: Ophthalmology

## 2021-07-12 DIAGNOSIS — H33102 Unspecified retinoschisis, left eye: Secondary | ICD-10-CM | POA: Diagnosis not present

## 2021-07-12 DIAGNOSIS — H3522 Other non-diabetic proliferative retinopathy, left eye: Secondary | ICD-10-CM | POA: Diagnosis not present

## 2021-07-12 DIAGNOSIS — H43822 Vitreomacular adhesion, left eye: Secondary | ICD-10-CM

## 2021-07-12 DIAGNOSIS — H2512 Age-related nuclear cataract, left eye: Secondary | ICD-10-CM | POA: Diagnosis not present

## 2021-07-12 NOTE — Assessment & Plan Note (Signed)
Mostly stable and active disease peripherally

## 2021-07-12 NOTE — Assessment & Plan Note (Signed)
Central nuclear sclerosis present likely accounts for refractive error 20/70 panel in 2020 left eye.  Likely needs general eye examination and refraction

## 2021-07-12 NOTE — Progress Notes (Signed)
07/12/2021     CHIEF COMPLAINT Patient presents for Retina Follow Up   HISTORY OF PRESENT ILLNESS: [name] is a 50 y.o. male who presents to the clinic today for:   HPI     Retina Follow Up           Diagnosis: Other   Laterality: both eyes   Onset: 7 months ago   Severity: mild   Duration: 7 months   Course: stable         Comments   7 month fu OU and OCT Pt states VA OU stable since last visit. Pt denies FOL, floaters, or ocular pain OU.        Last edited by Demetrios Loll, COA on 07/12/2021  9:17 AM.      Referring physician: Gwenyth Bender, MD [address] [place],  [place] [address]  HISTORICAL INFORMATION:   Selected notes from the MEDICAL RECORD NUMBER       CURRENT MEDICATIONS: Current Outpatient Medications (Ophthalmic Drugs)  Medication Sig   prednisoLONE acetate (PRED FORTE) 1 % ophthalmic suspension Place 1 drop into the left eye 4 (four) times daily. (Patient not taking: Reported on 12/03/2020)   No current facility-administered medications for this visit. (Ophthalmic Drugs)   Current Outpatient Medications (Other)  Medication Sig   acetaminophen (TYLENOL) 500 MG tablet Take 1,000 mg by mouth every 4 (four) hours as needed for mild pain.   Cholecalciferol (VITAMIN D3) 250 MCG (10000 UT) TABS Take 10,000 Units by mouth daily.   enoxaparin (LOVENOX) 40 MG/0.4ML injection Inject 0.4 mLs (40 mg total) into the skin daily.   ferrous sulfate 325 (65 FE) MG tablet Take 325 mg by mouth daily with breakfast.   folic acid (FOLVITE) 400 MCG tablet Take 400 mcg by mouth daily.   methocarbamol (ROBAXIN) 500 MG tablet Take 1 tablet (500 mg total) by mouth every 8 (eight) hours as needed for muscle spasms. (Patient not taking: Reported on 04/10/2020)   Omega-3 Fatty Acids (FISH OIL) 1000 MG CAPS Take 1,000 mg by mouth daily.   ondansetron (ZOFRAN) 4 MG tablet Take 4 mg by mouth every 8 (eight) hours as needed for nausea  or vomiting.   oxyCODONE-acetaminophen (PERCOCET) 10-325 MG tablet Take 1 tablet by mouth every 6 (six) hours as needed for pain.   Zinc 50 MG TABS Take 50 mg by mouth daily.   No current facility-administered medications for this visit. (Other)      REVIEW OF SYSTEMS:    ALLERGIES No Known Allergies  PAST MEDICAL HISTORY 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-       09-16-2020   Low testosterone    Mental disorder    Mixed restrictive and obstructive lung disease (HCC)    Osteoarthrosis, unspecified whether generalized or localized, unspecified site    Pain in joint, site unspecified    Physical exam, annual 02/18/2010   Sickle cell anemia (HCC)    Tobacco use    Past Surgical History:  Procedure Laterality Date   ELECTROCARDIOGRAM  01/31/2011   ESOPHAGOGASTRODUODENOSCOPY  01/03/2013   Procedure: ESOPHAGOGASTRODUODENOSCOPY (EGD);  Surgeon: Meryl Dare, MD,FACG;  Location: [facility];  Service: Endoscopy;  Laterality:  N/A;   EYE SURGERY Left 09/16/2020   Membrane Peel and Vitrectomy, Dr. Luciana Axe   TOTAL SHOULDER ARTHROPLASTY Left 03/18/2020   Procedure: TOTAL SHOULDER ARTHROPLASTY;  Surgeon: Bjorn Pippin, MD;  Location: [facility];  Service: Orthopedics;  Laterality: Left;   UPPER GASTROINTESTINAL ENDOSCOPY  11/18/2010    FAMILY HISTORY History reviewed. No pertinent family history.  SOCIAL HISTORY Social History   Tobacco Use   Smoking status: Former   Smokeless tobacco: Never  Building services engineer Use: Never used  Substance Use Topics   Alcohol use: Yes    Alcohol/week: 0.0 standard drinks    Comment: OCCASIONAL   Drug use: No         OPHTHALMIC EXAM:  Base Eye Exam     Visual Acuity (ETDRS)        Right Left   Dist [place] 20/25 -1 20/70 -1   Dist ph [place]  20/25         Tonometry (Tonopen, 9:21 AM)       Right Left   Pressure 17 17         Pupils       Pupils Dark Light Shape React APD   Right PERRL 2 1 Round Minimal None   Left PERRL 2 1 Round Minimal None         Visual Fields (Counting fingers)       Left Right    Full Full         Extraocular Movement       Right Left    Full Full         Neuro/Psych     Oriented x3: Yes   Mood/Affect: Normal         Dilation     Both eyes: 1.0% Mydriacyl, 2.5% Phenylephrine @ 9:21 AM           Slit Lamp and Fundus Exam     External Exam       Right Left   External Normal Normal         Slit Lamp Exam       Right Left   Lids/Lashes Normal Normal   Conjunctiva/Sclera White and quiet White and quiet   Cornea Clear Clear   Anterior Chamber Deep and quiet Deep and quiet   Iris Round and reactive Round and reactive   Lens 1+ Nuclear sclerosis 2+ Nuclear sclerosis, now with [place] central nuclear sclerosis out of proportion to the clear cortex   Anterior Vitreous Normal Normal         Fundus Exam       Right Left   Posterior Vitreous Normal Clear view, vitrectomized   Disc Normal Normal   C/D Ratio 0.25 0.25   Macula Normal Normal, much less topographic distortion   Vessels Old fibrous and vascular disease, mostly inactive in this right eye temporally and inferiorly, yet with traction at 9:00 with atrophic hole, and areas anteriorly of retinal nonperfusion Hemoglobin  seafan retinopathy present peripherally,  inactive temporally   Periphery Traction retinal detachment inferiorly and inferotemporally, with a hole, no progression because of posterior PRP Seafan retinopathy similarly present, change in peripheral fibrosis, good PRP peripherally, no active neovascularization            IMAGING AND PROCEDURES  Imaging and Procedures for 07/12/21  OCT, Retina - OU - Both Eyes       Right  Eye Quality was good. Scan locations included subfoveal. Central Foveal Thickness: 283.  Progression has been stable. Findings include normal foveal contour.   Left Eye Quality was good. Scan locations included subfoveal. Central Foveal Thickness: 346. Progression has improved. Findings include abnormal foveal contour.   Notes OS, anatomy of the macula vastly improved status post vitrectomy membrane peel release of vitreal macular traction and removal of internal limiting membrane for VMT triggered foveomacular schisis, see preop OCT June 15, 2020             ASSESSMENT/PLAN:  Macular retinoschisis of left eye Visual acuity OS now 2020 via pinhole likely with nuclear sclerosis progression post vitrectomy  Preoperative OCT October 2021 with acuity at that time a 20/200 pinhole no improvement  Nondiabetic proliferative retinopathy of left eye Mostly stable and active disease peripherally  Nuclear sclerotic cataract of left eye Central nuclear sclerosis present likely accounts for refractive error 20/70 panel in 2020 left eye.  Likely needs general eye examination and refraction     ICD-10-CM   1. Macular retinoschisis of left eye  H33.102 OCT, Retina - OU - Both Eyes    2. Vitreomacular adhesion of left eye  H43.822 OCT, Retina - OU - Both Eyes    3. Nondiabetic proliferative retinopathy of left eye  H35.22     4. Nuclear sclerotic cataract of left eye  H25.12       1.  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  Now with improved acuity.  Now with the vastly improved macular anatomy left eye.  2.  Macular of the right eye is stable  3.  Nondiabetic proliferative retinopathy secondary to hemoglobin [place] disease stable  4.  Nuclear sclerotic cataract progressing in the left eye in the vitrectomized eye, likely from central nuclear sclerosis out of proportion to the surrounding cortex.   (Oil droplet cataract) needs general eye examination and refraction nonemergent basis to monitor  Ophthalmic Meds Ordered this visit:  No orders of the defined types were placed in this encounter.      Return in about 1 year (around 07/12/2022) for COLOR FP, OCT.  There are no Patient Instructions on file for this visit.   Explained the diagnoses, plan, and follow up with the patient and they expressed understanding.  Patient expressed understanding of the importance of proper follow up care.   Alford Highland Grover Robinson M.D. Diseases & Surgery of the Retina and Vitreous [facility] 07/12/21     Abbreviations: M myopia (nearsighted); A astigmatism; H hyperopia (farsighted); P presbyopia; Mrx spectacle prescription;  CTL contact lenses; OD right eye; OS left eye; OU both eyes  XT exotropia; ET esotropia; PEK punctate epithelial keratitis; PEE punctate epithelial erosions; DES dry eye syndrome; MGD meibomian gland dysfunction; ATs artificial tears; PFAT's preservative free artificial tears; NSC nuclear sclerotic cataract; PSC posterior subcapsular cataract; ERM epi-retinal membrane; PVD posterior vitreous detachment; RD retinal detachment; DM diabetes mellitus; DR diabetic retinopathy; NPDR non-proliferative diabetic retinopathy; PDR proliferative diabetic retinopathy; CSME clinically significant macular edema; DME diabetic macular edema; dbh dot blot hemorrhages; CWS cotton wool spot; POAG primary open angle glaucoma; C/D cup-to-disc ratio; HVF humphrey visual field; GVF goldmann visual field; OCT optical coherence tomography; IOP intraocular pressure; BRVO Branch retinal vein occlusion; CRVO central retinal vein occlusion; CRAO central retinal artery occlusion; BRAO branch retinal artery occlusion; RT retinal tear; SB scleral buckle; PPV pars plana vitrectomy; VH Vitreous hemorrhage; PRP panretinal laser photocoagulation; IVK intravitreal kenalog; VMT vitreomacular traction; MH Macular  hole;  NVD neovascularization of the disc; NVE neovascularization elsewhere; AREDS age related eye disease study; ARMD age related macular degeneration; POAG primary open angle glaucoma; EBMD epithelial/anterior basement membrane dystrophy; ACIOL anterior chamber intraocular lens; IOL intraocular lens; PCIOL posterior chamber intraocular lens; Phaco/IOL phacoemulsification with intraocular lens placement; [place] photorefractive keratectomy; LASIK laser assisted in situ keratomileusis; HTN hypertension; DM diabetes mellitus; COPD chronic obstructive pulmonary disease

## 2021-07-12 NOTE — Assessment & Plan Note (Signed)
Visual acuity OS now 2020 via pinhole likely with nuclear sclerosis progression post vitrectomy  Preoperative OCT October 2021 with acuity at that time a 20/200 pinhole no improvement

## 2021-07-19 ENCOUNTER — Other Ambulatory Visit (HOSPITAL_COMMUNITY): Payer: Self-pay | Admitting: Internal Medicine

## 2021-07-19 DIAGNOSIS — R011 Cardiac murmur, unspecified: Secondary | ICD-10-CM

## 2021-07-20 ENCOUNTER — Ambulatory Visit (HOSPITAL_COMMUNITY)
Admission: RE | Admit: 2021-07-20 | Discharge: 2021-07-20 | Disposition: A | Discharge status: Home / Self Care | Payer: BC Managed Care – PPO | Source: Ambulatory Visit | Attending: Internal Medicine | Admitting: Internal Medicine

## 2021-07-20 ENCOUNTER — Other Ambulatory Visit: Payer: Self-pay

## 2021-07-20 DIAGNOSIS — F17200 Nicotine dependence, unspecified, uncomplicated: Secondary | ICD-10-CM | POA: Diagnosis not present

## 2021-07-20 DIAGNOSIS — I517 Cardiomegaly: Secondary | ICD-10-CM | POA: Insufficient documentation

## 2021-07-20 DIAGNOSIS — R011 Cardiac murmur, unspecified: Secondary | ICD-10-CM | POA: Diagnosis not present

## 2021-07-20 LAB — ECHOCARDIOGRAM COMPLETE
Area-P 1/2: 3.06 cm2
Calc EF: 53.4 %
S' Lateral: 2.8 cm
Single Plane A2C EF: 53.9 %
Single Plane A4C EF: 53.5 %

## 2021-07-20 NOTE — Progress Notes (Signed)
  Echocardiogram 2D Echocardiogram has been performed.  Augustine Radar 07/20/2021, 11:48 AM

## 2021-08-15 ENCOUNTER — Emergency Department (HOSPITAL_COMMUNITY)
Admission: EM | Admit: 2021-08-15 | Discharge: 2021-08-15 | Disposition: A | Discharge status: Home / Self Care | Payer: BC Managed Care – PPO | Source: Home / Self Care | Attending: Emergency Medicine | Admitting: Emergency Medicine

## 2021-08-15 ENCOUNTER — Other Ambulatory Visit: Payer: Self-pay

## 2021-08-15 ENCOUNTER — Emergency Department (HOSPITAL_COMMUNITY): Payer: BC Managed Care – PPO

## 2021-08-15 ENCOUNTER — Encounter (HOSPITAL_COMMUNITY): Payer: Self-pay | Admitting: Emergency Medicine

## 2021-08-15 DIAGNOSIS — Z20822 Contact with and (suspected) exposure to covid-19: Secondary | ICD-10-CM | POA: Insufficient documentation

## 2021-08-15 DIAGNOSIS — Z96612 Presence of left artificial shoulder joint: Secondary | ICD-10-CM | POA: Insufficient documentation

## 2021-08-15 DIAGNOSIS — M79601 Pain in right arm: Secondary | ICD-10-CM | POA: Insufficient documentation

## 2021-08-15 DIAGNOSIS — R110 Nausea: Secondary | ICD-10-CM

## 2021-08-15 DIAGNOSIS — D5700 Hb-SS disease with crisis, unspecified: Secondary | ICD-10-CM | POA: Diagnosis present

## 2021-08-15 DIAGNOSIS — R1110 Vomiting, unspecified: Secondary | ICD-10-CM | POA: Diagnosis not present

## 2021-08-15 DIAGNOSIS — Z87891 Personal history of nicotine dependence: Secondary | ICD-10-CM | POA: Insufficient documentation

## 2021-08-15 DIAGNOSIS — D57219 Sickle-cell/Hb-C disease with crisis, unspecified: Secondary | ICD-10-CM | POA: Insufficient documentation

## 2021-08-15 DIAGNOSIS — D638 Anemia in other chronic diseases classified elsewhere: Secondary | ICD-10-CM | POA: Diagnosis not present

## 2021-08-15 DIAGNOSIS — G894 Chronic pain syndrome: Secondary | ICD-10-CM | POA: Diagnosis not present

## 2021-08-15 DIAGNOSIS — R112 Nausea with vomiting, unspecified: Secondary | ICD-10-CM

## 2021-08-15 LAB — CBC WITH DIFFERENTIAL/PLATELET
Abs Immature Granulocytes: 0.02 10*3/uL (ref 0.00–0.07)
Basophils Absolute: 0 10*3/uL (ref 0.0–0.1)
Basophils Relative: 0 %
Eosinophils Absolute: 0 10*3/uL (ref 0.0–0.5)
Eosinophils Relative: 0 %
HCT: 32.2 % — ABNORMAL LOW (ref 39.0–52.0)
Hemoglobin: 11.2 g/dL — ABNORMAL LOW (ref 13.0–17.0)
Immature Granulocytes: 0 %
Lymphocytes Relative: 13 %
Lymphs Abs: 0.7 10*3/uL (ref 0.7–4.0)
MCH: 26.2 pg (ref 26.0–34.0)
MCHC: 34.8 g/dL (ref 30.0–36.0)
MCV: 75.4 fL — ABNORMAL LOW (ref 80.0–100.0)
Monocytes Absolute: 0.3 10*3/uL (ref 0.1–1.0)
Monocytes Relative: 6 %
Neutro Abs: 4.3 10*3/uL (ref 1.7–7.7)
Neutrophils Relative %: 81 %
Platelets: 215 10*3/uL (ref 150–400)
RBC: 4.27 MIL/uL (ref 4.22–5.81)
RDW: 16.6 % — ABNORMAL HIGH (ref 11.5–15.5)
WBC: 5.3 10*3/uL (ref 4.0–10.5)
nRBC: 0 % (ref 0.0–0.2)

## 2021-08-15 LAB — COMPREHENSIVE METABOLIC PANEL
ALT: 11 U/L (ref 0–44)
AST: 13 U/L — ABNORMAL LOW (ref 15–41)
Albumin: 4.8 g/dL (ref 3.5–5.0)
Alkaline Phosphatase: 60 U/L (ref 38–126)
Anion gap: 9 (ref 5–15)
BUN: 12 mg/dL (ref 6–20)
CO2: 27 mmol/L (ref 22–32)
Calcium: 9.9 mg/dL (ref 8.9–10.3)
Chloride: 109 mmol/L (ref 98–111)
Creatinine, Ser: 0.86 mg/dL (ref 0.61–1.24)
GFR, Estimated: >60 mL/min (ref >60)
Glucose, Bld: 126 mg/dL — ABNORMAL HIGH (ref 70–99)
Potassium: 3.6 mmol/L (ref 3.5–5.1)
Sodium: 145 mmol/L (ref 135–145)
Total Bilirubin: 1.7 mg/dL — ABNORMAL HIGH (ref 0.3–1.2)
Total Protein: 8.4 g/dL — ABNORMAL HIGH (ref 6.5–8.1)

## 2021-08-15 LAB — RESP PANEL BY RT-PCR (FLU A&B, COVID) ARPGX2
Influenza A by PCR: NEGATIVE
Influenza B by PCR: NEGATIVE
SARS Coronavirus 2 by RT PCR: NEGATIVE

## 2021-08-15 LAB — RETICULOCYTES
Immature Retic Fract: 28.5 % — ABNORMAL HIGH (ref 2.3–15.9)
RBC.: 4.16 MIL/uL — ABNORMAL LOW (ref 4.22–5.81)
Retic Count, Absolute: 148.1 10*3/uL (ref 19.0–186.0)
Retic Ct Pct: 3.6 % — ABNORMAL HIGH (ref 0.4–3.1)

## 2021-08-15 LAB — LIPASE, BLOOD: Lipase: 32 U/L (ref 11–51)

## 2021-08-15 MED ORDER — SUCRALFATE 1 G PO TABS: 1.0000 g | ORAL_TABLET | Freq: Three times a day (TID) | ORAL | 0 refills | Status: DC

## 2021-08-15 MED ORDER — HYDROMORPHONE HCL 2 MG/ML IJ SOLN
2.0000 mg | Freq: Once | INTRAMUSCULAR | Status: AC
Start: 2021-08-15 — End: 2021-08-15
  Administered 2021-08-15: 2 mg via INTRAVENOUS
  Filled 2021-08-15: qty 1

## 2021-08-15 MED ORDER — HYDROMORPHONE HCL 2 MG/ML IJ SOLN
2.0000 mg | INTRAMUSCULAR | Status: AC
  Administered 2021-08-15: 2 mg via INTRAVENOUS
  Filled 2021-08-15: qty 1

## 2021-08-15 MED ORDER — DEXTROSE-NACL 5-0.45 % IV SOLN: INTRAVENOUS | Status: DC

## 2021-08-15 MED ORDER — DIPHENHYDRAMINE HCL 25 MG PO CAPS
25.0000 mg | ORAL_CAPSULE | ORAL | Status: DC | PRN
  Administered 2021-08-15: 25 mg via ORAL
  Filled 2021-08-15: qty 1

## 2021-08-15 MED ORDER — ONDANSETRON HCL 4 MG/2ML IJ SOLN: 4.0000 mg | INTRAMUSCULAR | Status: DC | PRN

## 2021-08-15 MED ORDER — ONDANSETRON 4 MG PO TBDP
4.0000 mg | ORAL_TABLET | Freq: Once | ORAL | Status: AC
  Administered 2021-08-15: 4 mg via ORAL
  Filled 2021-08-15: qty 1

## 2021-08-15 MED ORDER — OXYCODONE-ACETAMINOPHEN 5-325 MG PO TABS: 1.0000 | ORAL_TABLET | Freq: Four times a day (QID) | ORAL | 0 refills | Status: DC | PRN

## 2021-08-15 NOTE — ED Triage Notes (Signed)
Pt c/o abdominal pain, nausea, and vomiting since last night. Pt actively vomiting during triage. Reports he is starting to have a sickle cell crisis with pain in his right arm.

## 2021-08-15 NOTE — Discharge Instructions (Addendum)
Please follow up with your sickle cell specialist in next 24-48 hours.

## 2021-08-15 NOTE — ED Provider Notes (Signed)
[facility]-EMERGENCY DEPT Provider Note   CSN: [id number] Arrival date & time: 08/15/21  1708     History Chief Complaint  Patient presents with   Abdominal Pain   Nausea   Sickle Cell Pain Crisis    [name] is a 50 y.o. male.  This is a 50 y.o. male with significant medical history as below, including sickle cell anemia, acid reflux who presents to the ED with complaint of nausea, vomiting, right arm pain.  Concern for sickle cell pain crisis.  Patient follows intermittently with sickle cell clinic.  Ran out of home oxycodone 1 to 2 days ago. Pain to right elbow.  Described as sharp, aching.  No trauma.  Nonradiating.  Worsened with arm movement, palpation.  Improved with rest.  No medications prior to arrival.  Onset of the symptoms approximately 36 hours ago.  Nausea and vomiting since late last night.  No abdominal pain.  No change in bowel or bladder function.  No fevers or chills.  No recent travel or sick contacts.   The history is provided by the patient. No language interpreter was used.  Sickle Cell Pain Crisis Location:  Upper extremity Severity:  Mild Onset quality:  Gradual Timing:  Constant Progression:  Worsening Chronicity:  New Context: change in medication   Context: not alcohol consumption   Relieved by:  None tried Worsened by:  Activity and movement Ineffective treatments:  None tried Associated symptoms: nausea and vomiting   Associated symptoms: no chest pain, no cough, no fever, no headaches and no shortness of breath       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-       09-16-2020   Low testosterone    Mental disorder    Mixed restrictive and  obstructive lung disease (HCC)    Osteoarthrosis, unspecified whether generalized or localized, unspecified site    Pain in joint, site unspecified    Physical exam, annual 02/18/2010   Sickle cell anemia (HCC)    Tobacco use     Patient Active Problem List   Diagnosis Date Noted   Nuclear sclerotic cataract of left eye 12/03/2020   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   Vitreomacular adhesion of left eye 06/15/2020   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   Nondiabetic proliferative retinopathy, right 06/15/2020   Traction detachment of right retina 06/15/2020   Vitreous hemorrhage of left eye (HCC) 06/15/2020   Gallstones    Generalized abdominal pain    Sickle cell anemia (HCC)    Non-intractable vomiting    Sickle cell pain crisis (HCC) 04/10/2020   Postop check    Sickle cell anemia with pain (HCC) 03/19/2020   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/18/2020   Sickle cell anemia with crisis (HCC) 03/15/2016   Thrombocytopenia (HCC) 01/02/2013   Anemia 01/02/2013   Hemoglobin [place] with crisis (HCC) 01/02/2013   Epigastric abdominal pain 01/02/2013   Fever of unknown origin 01/02/2013   Abdominal pain, left upper quadrant 01/02/2013   Avascular necrosis of left femoral head (HCC)    Erectile dysfunction    Low testosterone    Mixed restrictive and obstructive lung disease (HCC)    Gastritis  and duodenitis    Impotence of organic origin    Osteoarthrosis, unspecified whether generalized or localized, unspecified site    Pain in joint, site unspecified     Past Surgical History:  Procedure Laterality Date   ELECTROCARDIOGRAM  01/31/2011   ESOPHAGOGASTRODUODENOSCOPY  01/03/2013   Procedure: ESOPHAGOGASTRODUODENOSCOPY (EGD);  Surgeon: Meryl Dare, MD,FACG;  Location: [facility];  Service: Endoscopy;  Laterality: N/A;   EYE SURGERY Left 09/16/2020   Membrane Peel and Vitrectomy, Dr. Luciana Axe   TOTAL SHOULDER ARTHROPLASTY Left  03/18/2020   Procedure: TOTAL SHOULDER ARTHROPLASTY;  Surgeon: Bjorn Pippin, MD;  Location: [facility];  Service: Orthopedics;  Laterality: Left;   UPPER GASTROINTESTINAL ENDOSCOPY  11/18/2010       History reviewed. No pertinent family history.  Social History   Tobacco Use   Smoking status: Former   Smokeless tobacco: Never  Building services engineer Use: Never used  Substance Use Topics   Alcohol use: Yes    Alcohol/week: 0.0 standard drinks    Comment: OCCASIONAL   Drug use: No    Home Medications Prior to Admission medications   Medication Sig Start Date End Date Taking? Authorizing Provider  oxyCODONE-acetaminophen (PERCOCET/ROXICET) 5-325 MG tablet Take 1 tablet by mouth every 6 (six) hours as needed for up to 5 doses for severe pain. 08/15/21  Yes Tanda Rockers A, DO  sucralfate (CARAFATE) 1 g tablet Take 1 tablet (1 g total) by mouth 4 (four) times daily -  with meals and at bedtime for 5 days. 08/15/21 08/20/21 Yes Sloan Leiter, DO  acetaminophen (TYLENOL) 500 MG tablet Take 1,000 mg by mouth every 4 (four) hours as needed for mild pain.    [provider]  Cholecalciferol (VITAMIN D3) 250 MCG (10000 UT) TABS Take 10,000 Units by mouth daily.    [provider]  enoxaparin (LOVENOX) 40 MG/0.4ML injection Inject 0.4 mLs (40 mg total) into the skin daily. 03/20/20 04/19/20  Vernetta Honey, PA-C  ferrous sulfate 325 (65 FE) MG tablet Take 325 mg by mouth daily with breakfast.    [provider]  folic acid (FOLVITE) 400 MCG tablet Take 400 mcg by mouth daily.    [provider]  methocarbamol (ROBAXIN) 500 MG tablet Take 1 tablet (500 mg total) by mouth every 8 (eight) hours as needed for muscle spasms. Patient not taking: Reported on 04/10/2020 03/20/20   Vernetta Honey, PA-C  Omega-3 Fatty Acids (FISH OIL) 1000 MG CAPS Take 1,000 mg by mouth daily.    [provider]  ondansetron (ZOFRAN) 4 MG tablet Take 4 mg by mouth every 8 (eight)  hours as needed for nausea or vomiting.    [provider]  oxyCODONE-acetaminophen (PERCOCET) 10-325 MG tablet Take 1 tablet by mouth every 6 (six) hours as needed for pain. 01/20/20   [provider]  prednisoLONE acetate (PRED FORTE) 1 % ophthalmic suspension Place 1 drop into the left eye 4 (four) times daily. Patient not taking: Reported on 12/03/2020 08/31/20   Edmon Crape, MD  Zinc 50 MG TABS Take 50 mg by mouth daily.    [provider]    Allergies    Patient has no known allergies.  Review of Systems   Review of Systems  Constitutional:  Negative for chills and fever.  HENT:  Negative for facial swelling and trouble swallowing.   Eyes:  Negative for photophobia and visual disturbance.  Respiratory:  Negative for cough and shortness  of breath.   Cardiovascular:  Negative for chest pain and palpitations.  Gastrointestinal:  Positive for nausea and vomiting. Negative for abdominal pain.  Endocrine: Negative for polydipsia and polyuria.  Genitourinary:  Negative for difficulty urinating and hematuria.  Musculoskeletal:  Positive for arthralgias. Negative for gait problem and joint swelling.  Skin:  Negative for pallor and rash.  Neurological:  Negative for syncope and headaches.  Psychiatric/Behavioral:  Negative for agitation and confusion.    Physical Exam Updated Vital Signs BP 118/79   Pulse 75   Temp 99.6 F (37.6 C) (Oral)   Resp 11   SpO2 99%   Physical Exam Vitals and nursing note reviewed.  Constitutional:      General: He is not in acute distress.    Appearance: He is well-developed.  HENT:     Head: Normocephalic and atraumatic.     Right Ear: External ear normal.     Left Ear: External ear normal.     Mouth/Throat:     Mouth: Mucous membranes are moist.  Eyes:     General: No scleral icterus. Cardiovascular:     Rate and Rhythm: Normal rate and regular rhythm.     Pulses: Normal pulses.     Heart sounds: Normal heart  sounds.  Pulmonary:     Effort: Pulmonary effort is normal. No respiratory distress.     Breath sounds: Normal breath sounds.  Abdominal:     General: Abdomen is flat.     Palpations: Abdomen is soft.     Tenderness: There is no abdominal tenderness. There is no right CVA tenderness or left CVA tenderness.  Musculoskeletal:        General: Normal range of motion.       Arms:     Cervical back: Normal range of motion.     Right lower leg: No edema.     Left lower leg: No edema.     Comments: 2 Plus radial pulses.  Bilateral upper extremities are neurovascular intact.  Equal and Symmetric.  Skin:    General: Skin is warm and dry.     Capillary Refill: Capillary refill takes less than 2 seconds.  Neurological:     Mental Status: He is alert and oriented to person, place, and time.  Psychiatric:        Mood and Affect: Mood normal.        Behavior: Behavior normal.    ED Results / Procedures / Treatments   Labs (all labs ordered are listed, but only abnormal results are displayed) Labs Reviewed  CBC WITH DIFFERENTIAL/PLATELET - Abnormal; Notable for the following components:      Result Value   Hemoglobin 11.2 (*)    HCT 32.2 (*)    MCV 75.4 (*)    RDW 16.6 (*)    All other components within normal limits  COMPREHENSIVE METABOLIC PANEL - Abnormal; Notable for the following components:   Glucose, Bld 126 (*)    Total Protein 8.4 (*)    AST 13 (*)    Total Bilirubin 1.7 (*)    All other components within normal limits  RETICULOCYTES - Abnormal; Notable for the following components:   Retic Ct Pct 3.6 (*)    RBC. 4.16 (*)    Immature Retic Fract 28.5 (*)    All other components within normal limits  RESP PANEL BY RT-PCR (FLU A&B, COVID) ARPGX2  LIPASE, BLOOD  URINALYSIS, ROUTINE W REFLEX MICROSCOPIC    EKG EKG Interpretation  Date/Time:  Sunday August 15 2021 20:34:13 EDT Ventricular Rate:  78 PR Interval:  138 QRS Duration: 86 QT Interval:  366 QTC  Calculation: 417 R Axis:   70 Text Interpretation: Normal sinus rhythm T wave abnormality, consider inferolateral ischemia Abnormal ECG Confirmed by Tanda Rockers ([id number]) on 08/16/2021 12:27:09 AM  Radiology DG Chest 2 View  Result Date: 08/15/2021 CLINICAL DATA:  Sickle cell.  Vomiting. EXAM: CHEST - 2 VIEW COMPARISON:  Chest x-ray 04/10/2020. FINDINGS: The heart size and mediastinal contours are within normal limits. Both lungs are clear. Left shoulder arthroplasty is present. IMPRESSION: No active cardiopulmonary disease. Electronically Signed   By: Darliss Cheney M.D.   On: 08/15/2021 20:08   DG Elbow 2 Views Right  Result Date: 08/15/2021 CLINICAL DATA:  Elbow pain. EXAM: RIGHT ELBOW - 2 VIEW COMPARISON:  Right elbow x-ray 11/23/2005. FINDINGS: There is no evidence of fracture, dislocation, or joint effusion. There is no evidence of arthropathy or other focal bone abnormality. Soft tissues are unremarkable. IMPRESSION: Negative. Electronically Signed   By: Darliss Cheney M.D.   On: 08/15/2021 20:09    Procedures Procedures   Medications Ordered in ED Medications  ondansetron (ZOFRAN) injection 4 mg (has no administration in time range)  diphenhydrAMINE (BENADRYL) capsule 25-50 mg (25 mg Oral Given 08/15/21 2011)  dextrose 5 %-0.45 % sodium chloride infusion (0 mLs Intravenous Stopped 08/15/21 2305)  ondansetron (ZOFRAN-ODT) disintegrating tablet 4 mg (4 mg Oral Given 08/15/21 2013)  HYDROmorphone (DILAUDID) injection 2 mg (2 mg Intravenous Given 08/15/21 2010)  HYDROmorphone (DILAUDID) injection 2 mg (2 mg Intravenous Given 08/15/21 2051)  HYDROmorphone (DILAUDID) injection 2 mg (2 mg Intravenous Given 08/15/21 2145)    ED Course  I have reviewed the triage vital signs and the nursing notes.  Pertinent labs & imaging results that were available during my care of the patient were reviewed by me and considered in my medical decision making (see chart for details).    MDM  Rules/Calculators/A&P                         This patient complains of arm pain, nausea and vomiting, sickle cell pain crisis; this involves an extensive number of treatment Options and is a complaint that carries with it a high risk of complications and Morbidity.  Vital signs reviewed and are stable.  He is breathing comfortably on room air.  Serious etiologies considered.     Labs reviewed and are stable.    Patient likely with sickle cell pain as etiology of his symptoms. He has some nausea and vomiting, he may have underlying gastritis that is compounding his clinical picture. Overall he is feeling much better. Is able to tolerate oral intake and has had no further episodes of emesis. Labs are stable, does not require any further emergent intervention at this time. XR negative. At this time recommend discharge home and close o/p f/u with PCP and sickle cell team. Pt and spouse at bedside are agreeable. Given short course of oral norco, carafate at discharge. Dietary recommendations discussed with patient and spouse.    The patient improved significantly and was discharged in stable condition. Detailed discussions were had with the patient regarding current findings, and need for close f/u with PCP or on call doctor. The patient has been instructed to return immediately if the symptoms worsen in any way for re-evaluation. Patient verbalized understanding and is in agreement with current care plan. All questions answered  prior to discharge.    Final Clinical Impression(s) / ED Diagnoses Final diagnoses:  Sickle cell pain crisis (HCC)  Nausea and vomiting, intractability of vomiting not specified, unspecified vomiting type    Rx / DC Orders ED Discharge Orders          Ordered    oxyCODONE-acetaminophen (PERCOCET/ROXICET) 5-325 MG tablet  Every 6 hours PRN        08/15/21 2251    sucralfate (CARAFATE) 1 g tablet  3 times daily with meals & bedtime        08/15/21 2251              Sloan Leiter, DO 08/16/21 0028

## 2021-08-15 NOTE — ED Notes (Signed)
Was unable to see a good vein to draw blood from

## 2021-08-15 NOTE — ED Provider Notes (Signed)
Emergency Medicine Provider Triage Evaluation Note  [name] , a 50 y.o. male  was evaluated in triage.  Pt complains of vomiting x1 day.  Started acutely last night, he is also having abdominal pain that started around the same time.  Patient also reports he has sickle cell disease and is starting to have right arm pain.  States he does not usually have vomiting associated with his pain crisis presentation.  Denies chest pain or shortness of breath.  No diarrhea or fevers..  Review of Systems  Positive: Nausea, vomiting, abdominal pain, right arm pain Negative: Chest pain, shortness of breath  Physical Exam  There were no vitals taken for this visit. Gen:   Awake, no distress   Resp:  Normal effort  MSK:   Moves extremities without difficulty  Other:  Abdomen is soft  Medical Decision Making  Medically screening exam initiated at 5:45 PM.  Appropriate orders placed.  [name] was informed that the remainder of the evaluation will be completed by another provider, this initial triage assessment does not replace that evaluation, and the importance of remaining in the ED until their evaluation is complete.  Sickle cell pain crisis versus abdominal pathology.    Leonard Arista, PA-C 08/15/21 1746    Leonard Berkshire, MD 08/16/21 [phone]

## 2021-08-17 ENCOUNTER — Emergency Department (HOSPITAL_COMMUNITY): Payer: BC Managed Care – PPO

## 2021-08-17 ENCOUNTER — Other Ambulatory Visit: Payer: Self-pay

## 2021-08-17 ENCOUNTER — Observation Stay (HOSPITAL_COMMUNITY)
Admission: EM | Admit: 2021-08-17 | Discharge: 2021-08-18 | Disposition: A | Discharge status: Home / Self Care | Payer: BC Managed Care – PPO | Attending: Family Medicine | Admitting: Family Medicine

## 2021-08-17 ENCOUNTER — Encounter (HOSPITAL_COMMUNITY): Payer: Self-pay | Admitting: Emergency Medicine

## 2021-08-17 DIAGNOSIS — Z79899 Other long term (current) drug therapy: Secondary | ICD-10-CM | POA: Diagnosis not present

## 2021-08-17 DIAGNOSIS — D638 Anemia in other chronic diseases classified elsewhere: Secondary | ICD-10-CM | POA: Insufficient documentation

## 2021-08-17 DIAGNOSIS — Z7902 Long term (current) use of antithrombotics/antiplatelets: Secondary | ICD-10-CM

## 2021-08-17 DIAGNOSIS — Z20822 Contact with and (suspected) exposure to covid-19: Secondary | ICD-10-CM | POA: Diagnosis present

## 2021-08-17 DIAGNOSIS — J449 Chronic obstructive pulmonary disease, unspecified: Secondary | ICD-10-CM | POA: Diagnosis present

## 2021-08-17 DIAGNOSIS — Z96612 Presence of left artificial shoulder joint: Secondary | ICD-10-CM | POA: Diagnosis present

## 2021-08-17 DIAGNOSIS — R1110 Vomiting, unspecified: Secondary | ICD-10-CM | POA: Insufficient documentation

## 2021-08-17 DIAGNOSIS — K219 Gastro-esophageal reflux disease without esophagitis: Secondary | ICD-10-CM | POA: Diagnosis present

## 2021-08-17 DIAGNOSIS — A084 Viral intestinal infection, unspecified: Secondary | ICD-10-CM | POA: Diagnosis present

## 2021-08-17 DIAGNOSIS — G894 Chronic pain syndrome: Secondary | ICD-10-CM | POA: Insufficient documentation

## 2021-08-17 DIAGNOSIS — D5700 Hb-SS disease with crisis, unspecified: Principal | ICD-10-CM | POA: Insufficient documentation

## 2021-08-17 DIAGNOSIS — R1012 Left upper quadrant pain: Secondary | ICD-10-CM

## 2021-08-17 LAB — COMPREHENSIVE METABOLIC PANEL
ALT: 11 U/L (ref 0–44)
AST: 24 U/L (ref 15–41)
Albumin: 4.8 g/dL (ref 3.5–5.0)
Alkaline Phosphatase: 54 U/L (ref 38–126)
Anion gap: 14 (ref 5–15)
BUN: 13 mg/dL (ref 6–20)
CO2: 27 mmol/L (ref 22–32)
Calcium: 9.7 mg/dL (ref 8.9–10.3)
Chloride: 98 mmol/L (ref 98–111)
Creatinine, Ser: 0.94 mg/dL (ref 0.61–1.24)
GFR, Estimated: >60 mL/min (ref >60)
Glucose, Bld: 113 mg/dL — ABNORMAL HIGH (ref 70–99)
Potassium: 3.8 mmol/L (ref 3.5–5.1)
Sodium: 139 mmol/L (ref 135–145)
Total Bilirubin: 1.9 mg/dL — ABNORMAL HIGH (ref 0.3–1.2)
Total Protein: 7.9 g/dL (ref 6.5–8.1)

## 2021-08-17 LAB — URINALYSIS, ROUTINE W REFLEX MICROSCOPIC
Bilirubin Urine: NEGATIVE
Glucose, UA: NEGATIVE mg/dL
Hgb urine dipstick: NEGATIVE
Ketones, ur: 15 mg/dL — AB
Leukocytes,Ua: NEGATIVE
Nitrite: NEGATIVE
Protein, ur: NEGATIVE mg/dL
Specific Gravity, Urine: <1.005 — ABNORMAL LOW (ref 1.005–1.030)
pH: 7 (ref 5.0–8.0)

## 2021-08-17 LAB — LACTIC ACID, PLASMA: Lactic Acid, Venous: 1.4 mmol/L (ref 0.5–1.9)

## 2021-08-17 LAB — RETICULOCYTES
Immature Retic Fract: 28.2 % — ABNORMAL HIGH (ref 2.3–15.9)
RBC.: 4.41 MIL/uL (ref 4.22–5.81)
Retic Count, Absolute: 168 10*3/uL (ref 19.0–186.0)
Retic Ct Pct: 3.8 % — ABNORMAL HIGH (ref 0.4–3.1)

## 2021-08-17 LAB — CBC WITH DIFFERENTIAL/PLATELET
Abs Immature Granulocytes: 0.02 10*3/uL (ref 0.00–0.07)
Basophils Absolute: 0 10*3/uL (ref 0.0–0.1)
Basophils Relative: 0 %
Eosinophils Absolute: 0 10*3/uL (ref 0.0–0.5)
Eosinophils Relative: 0 %
HCT: 32.9 % — ABNORMAL LOW (ref 39.0–52.0)
Hemoglobin: 11.3 g/dL — ABNORMAL LOW (ref 13.0–17.0)
Immature Granulocytes: 0 %
Lymphocytes Relative: 13 %
Lymphs Abs: 0.8 10*3/uL (ref 0.7–4.0)
MCH: 25.6 pg — ABNORMAL LOW (ref 26.0–34.0)
MCHC: 34.3 g/dL (ref 30.0–36.0)
MCV: 74.6 fL — ABNORMAL LOW (ref 80.0–100.0)
Monocytes Absolute: 0.3 10*3/uL (ref 0.1–1.0)
Monocytes Relative: 5 %
Neutro Abs: 4.9 10*3/uL (ref 1.7–7.7)
Neutrophils Relative %: 82 %
Platelets: 206 10*3/uL (ref 150–400)
RBC: 4.41 MIL/uL (ref 4.22–5.81)
RDW: 16.4 % — ABNORMAL HIGH (ref 11.5–15.5)
WBC: 6 10*3/uL (ref 4.0–10.5)
nRBC: 0 % (ref 0.0–0.2)

## 2021-08-17 LAB — RESP PANEL BY RT-PCR (FLU A&B, COVID) ARPGX2
Influenza A by PCR: NEGATIVE
Influenza B by PCR: NEGATIVE
SARS Coronavirus 2 by RT PCR: NEGATIVE

## 2021-08-17 MED ORDER — FERROUS SULFATE 325 (65 FE) MG PO TABS
325.0000 mg | ORAL_TABLET | Freq: Every day | ORAL | Status: DC
  Administered 2021-08-17 – 2021-08-18 (×2): 325 mg via ORAL
  Filled 2021-08-17 (×2): qty 1

## 2021-08-17 MED ORDER — HYDROMORPHONE HCL 2 MG/ML IJ SOLN
2.0000 mg | INTRAMUSCULAR | Status: AC
  Administered 2021-08-17: 2 mg via SUBCUTANEOUS
  Filled 2021-08-17: qty 1

## 2021-08-17 MED ORDER — HYDROMORPHONE 1 MG/ML IV SOLN
INTRAVENOUS | Status: DC
Start: 2021-08-17 — End: 2021-08-18
  Administered 2021-08-17: 2 mg via INTRAVENOUS
  Administered 2021-08-17: 1.5 mg via INTRAVENOUS
  Administered 2021-08-18: 0 mg via INTRAVENOUS
  Filled 2021-08-17: qty 30

## 2021-08-17 MED ORDER — SODIUM CHLORIDE 0.9% FLUSH
9.0000 mL | INTRAVENOUS | Status: DC | PRN
Start: 2021-08-17 — End: 2021-08-18

## 2021-08-17 MED ORDER — IOHEXOL 350 MG/ML SOLN
80.0000 mL | Freq: Once | INTRAVENOUS | Status: AC | PRN
  Administered 2021-08-17: 80 mL via INTRAVENOUS

## 2021-08-17 MED ORDER — VITAMIN D3 25 MCG (1000 UNIT) PO TABS
10000.0000 [IU] | ORAL_TABLET | Freq: Every day | ORAL | Status: DC
  Administered 2021-08-18: 10000 [IU] via ORAL
  Filled 2021-08-17: qty 10

## 2021-08-17 MED ORDER — HYDROMORPHONE HCL 1 MG/ML IJ SOLN: 1.0000 mg | INTRAMUSCULAR | Status: DC | PRN

## 2021-08-17 MED ORDER — DIPHENHYDRAMINE HCL 25 MG PO CAPS: 25.0000 mg | ORAL_CAPSULE | ORAL | Status: DC | PRN

## 2021-08-17 MED ORDER — SODIUM CHLORIDE 0.45 % IV SOLN: INTRAVENOUS | Status: DC

## 2021-08-17 MED ORDER — SODIUM CHLORIDE 0.9 % IV BOLUS
1000.0000 mL | Freq: Once | INTRAVENOUS | Status: AC
  Administered 2021-08-17: 1000 mL via INTRAVENOUS

## 2021-08-17 MED ORDER — POLYETHYLENE GLYCOL 3350 17 G PO PACK: 17.0000 g | PACK | Freq: Every day | ORAL | Status: DC | PRN

## 2021-08-17 MED ORDER — SODIUM CHLORIDE 0.9 % IV SOLN
25.0000 mg | INTRAVENOUS | Status: DC | PRN
  Filled 2021-08-17: qty 0.5

## 2021-08-17 MED ORDER — ONDANSETRON HCL 4 MG/2ML IJ SOLN
4.0000 mg | Freq: Four times a day (QID) | INTRAMUSCULAR | Status: DC | PRN
  Administered 2021-08-18: 4 mg via INTRAVENOUS
  Filled 2021-08-17: qty 2

## 2021-08-17 MED ORDER — FOLIC ACID 1 MG PO TABS
1.0000 mg | ORAL_TABLET | Freq: Every day | ORAL | Status: DC
  Administered 2021-08-17 – 2021-08-18 (×2): 1 mg via ORAL
  Filled 2021-08-17 (×2): qty 1

## 2021-08-17 MED ORDER — NALOXONE HCL 0.4 MG/ML IJ SOLN: 0.4000 mg | INTRAMUSCULAR | Status: DC | PRN

## 2021-08-17 MED ORDER — ENOXAPARIN SODIUM 40 MG/0.4ML IJ SOSY
40.0000 mg | PREFILLED_SYRINGE | INTRAMUSCULAR | Status: DC
  Administered 2021-08-17: 40 mg via SUBCUTANEOUS
  Filled 2021-08-17: qty 0.4

## 2021-08-17 MED ORDER — OXYCODONE HCL 5 MG PO TABS: 10.0000 mg | ORAL_TABLET | ORAL | Status: DC | PRN

## 2021-08-17 MED ORDER — OMEGA-3-ACID ETHYL ESTERS 1 G PO CAPS
1.0000 g | ORAL_CAPSULE | Freq: Every day | ORAL | Status: DC
  Administered 2021-08-17 – 2021-08-18 (×2): 1 g via ORAL
  Filled 2021-08-17 (×2): qty 1

## 2021-08-17 MED ORDER — KETOROLAC TROMETHAMINE 15 MG/ML IJ SOLN
15.0000 mg | Freq: Four times a day (QID) | INTRAMUSCULAR | Status: DC
  Administered 2021-08-17 – 2021-08-18 (×4): 15 mg via INTRAVENOUS
  Filled 2021-08-17 (×4): qty 1

## 2021-08-17 MED ORDER — ONDANSETRON HCL 4 MG/2ML IJ SOLN
4.0000 mg | INTRAMUSCULAR | Status: DC | PRN
  Filled 2021-08-17: qty 2

## 2021-08-17 MED ORDER — PHENOL 1.4 % MT LIQD
1.0000 | OROMUCOSAL | Status: DC | PRN
  Administered 2021-08-17: 1 via OROMUCOSAL
  Filled 2021-08-17: qty 177

## 2021-08-17 MED ORDER — SENNOSIDES-DOCUSATE SODIUM 8.6-50 MG PO TABS
1.0000 | ORAL_TABLET | Freq: Two times a day (BID) | ORAL | Status: DC
  Administered 2021-08-17 – 2021-08-18 (×2): 1 via ORAL
  Filled 2021-08-17 (×2): qty 1

## 2021-08-17 NOTE — ED Notes (Signed)
Pt provided 300 mL of clear liquid for PO challenge

## 2021-08-17 NOTE — H&P (Signed)
H&P  Patient Demographics:  [name], is a 50 y.o. male  MRN: [id number]   DOB - 09-06-1971  Admit Date - 08/17/2021  Outpatient Primary MD for the patient is Gwenyth Bender, MD  Chief Complaint  Patient presents with   Abdominal Pain   Sickle Cell Pain Crisis      HPI:   [name]  is a 50 y.o. male with a medical history significant for sickle cell disease, chronic pain syndrome, opiate dependence and tolerance presents to the ER with abdominal pain, vomiting, and left elbow pain over the past 3 days.  Of note, patient was treated and evaluated in the emergency room 2 days prior for nausea, vomiting, and arm pain.  Patient received Zofran, Dilaudid, and Benadryl and was discharged home at that time.  Patient states that nausea and vomiting returned after eating.  Patient's wife reports some sick contacts.  He has had oral intolerance over the past several days and has not been able to keep down food or medications.  Patient continues to have bilateral arm pain, primarily to left side.  He is status post left elbow surgery 2 weeks ago.  At this time, patient says that nausea and vomiting have resolved.  Patient is currently eating dinner and appears to be tolerating well.  He continues to complain of pain primarily to left elbow and rates pain as 7/10.  He denies fever, chills, headache, chest pain, or shortness of breath.  No dizziness, paresthesias, diarrhea, or constipation.  No recent travel, or known exposure to COVID-19.  ER Course:  Vital signs show: BP (!) 161/90 (BP Location: Left Arm)   Pulse 72   Temp 99.2 F (37.3 C) (Oral)   Resp 17   Ht 5\' 5"  (1.651 m)   Wt 68 kg   SpO2 100%   BMI 24.96 kg/m .  CT of abdomen and pelvis shows no acute abdominal pelvic findings, cholelithiasis, sigmoid diverticulosis without evidence of acute diverticulitis, splenomegaly, and chronic osseous manifestations of sickle cell disease.  Chest x-ray shows no acute cardiopulmonary  findings.  COVID-19 test negative.  Lactic acid negative.  CBC shows a hemoglobin of 11.3, otherwise unremarkable.  Total bilirubin 1.9, otherwise unremarkable.  Pain persists despite IV Dilaudid, will admit for further management sickle cell pain crisis.    Review of systems:  Review of Systems  Constitutional:  Negative for chills and fever.  HENT: Negative.    Eyes: Negative.   Respiratory: Negative.    Cardiovascular: Negative.   Gastrointestinal: Negative.   Genitourinary: Negative.   Musculoskeletal:  Positive for back pain and joint pain.  Skin: Negative.   Neurological: Negative.   Psychiatric/Behavioral: Negative.     With Past History of the following :   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-       09-16-2020   Low testosterone    Mental disorder    Mixed restrictive and obstructive lung disease (HCC)    Osteoarthrosis, unspecified whether generalized or localized, unspecified site    Pain in joint, site unspecified    Physical exam, annual 02/18/2010   Sickle cell anemia (HCC)    Tobacco use       Past Surgical History:  Procedure  Laterality Date   ELECTROCARDIOGRAM  01/31/2011   ESOPHAGOGASTRODUODENOSCOPY  01/03/2013   Procedure: ESOPHAGOGASTRODUODENOSCOPY (EGD);  Surgeon: Meryl Dare, MD,FACG;  Location: [facility];  Service: Endoscopy;  Laterality: N/A;   EYE SURGERY Left 09/16/2020   Membrane Peel and Vitrectomy, Dr. Luciana Axe   TOTAL SHOULDER ARTHROPLASTY Left 03/18/2020   Procedure: TOTAL SHOULDER ARTHROPLASTY;  Surgeon: Bjorn Pippin, MD;  Location: [facility];  Service: Orthopedics;  Laterality: Left;   UPPER GASTROINTESTINAL ENDOSCOPY  11/18/2010     Social History:   Social History   Tobacco Use    Smoking status: Former   Smokeless tobacco: Never  Substance Use Topics   Alcohol use: Yes    Alcohol/week: 0.0 standard drinks    Comment: OCCASIONAL     Lives - At home   Family History :   History reviewed. No pertinent family history.   Home Medications:   Prior to Admission medications   Medication Sig Start Date End Date Taking? Authorizing Provider  acetaminophen (TYLENOL) 500 MG tablet Take 1,000 mg by mouth every 4 (four) hours as needed for mild pain.   Yes [provider]  celecoxib (CELEBREX) 200 MG capsule Take 200 mg by mouth 2 (two) times daily. 07/26/21  Yes [provider]  Cholecalciferol (VITAMIN D3) 250 MCG (10000 UT) TABS Take 10,000 Units by mouth daily.   Yes [provider]  diphenhydrAMINE (BENADRYL) 25 MG tablet Take 25 mg by mouth daily as needed for allergies.   Yes [provider]  ferrous sulfate 325 (65 FE) MG tablet Take 325 mg by mouth daily with breakfast.   Yes [provider]  folic acid (FOLVITE) 1 MG tablet Take 1 mg by mouth daily. 06/09/21  Yes [provider]  ibuprofen (ADVIL) 800 MG tablet Take 800 mg by mouth 3 (three) times daily as needed for pain. 07/22/21  Yes [provider]  Omega-3 Fatty Acids (FISH OIL) 1000 MG CAPS Take 1,000 mg by mouth daily.   Yes [provider]  Oxycodone HCl 10 MG TABS Take 10-20 mg by mouth every 4 (four) hours as needed (pain). 08/05/21  Yes [provider]  oxyCODONE-acetaminophen (PERCOCET) 10-325 MG tablet Take 1 tablet by mouth every 6 (six) hours as needed for pain. 01/20/20  Yes [provider]  Zinc 50 MG TABS Take 50 mg by mouth daily.   Yes [provider]  enoxaparin (LOVENOX) 40 MG/0.4ML injection Inject 0.4 mLs (40 mg total) into the skin daily. 03/20/20 04/19/20  Vernetta Honey, PA-C     Allergies:   No Known Allergies   Physical Exam:   Vitals:   Vitals:   08/18/21 0431 08/18/21 0508  BP:  (!) 161/90   Pulse: 72   Resp:  17  Temp: 99.2 F (37.3 C)   SpO2: 100% 100%    Physical Exam: Constitutional: Patient appears well-developed and well-nourished. Not in obvious distress. HENT: Normocephalic, atraumatic, External right and left ear normal. Oropharynx is clear and moist.  Eyes: Conjunctivae and EOM are normal. PERRLA, no scleral icterus. Neck: Normal ROM. Neck supple. No JVD. No tracheal deviation. No thyromegaly. CVS: RRR, S1/S2 +, no murmurs, no gallops, no carotid bruit.  Pulmonary: Effort and breath sounds normal, no stridor, rhonchi, wheezes, rales.  Abdominal: Soft. BS +, no distension, tenderness, rebound or guarding.  Musculoskeletal: Normal range of motion. No edema and no tenderness.  Lymphadenopathy: No lymphadenopathy noted, cervical, inguinal or axillary Neuro: Alert. Normal reflexes, muscle tone  coordination. No cranial nerve deficit. Skin: Skin is warm and dry. No rash noted. Not diaphoretic. No erythema. No pallor. Psychiatric: Normal mood and affect. Behavior, judgment, thought content normal.   Data Review:   CBC Recent Labs  Lab 08/15/21 2026 08/17/21 0830 08/18/21 0519  WBC 5.3 6.0 3.7*  HGB 11.2* 11.3* 11.1*  HCT 32.2* 32.9* 31.4*  PLT 215 206 179  MCV 75.4* 74.6* 73.5*  MCH 26.2 25.6* 26.0  MCHC 34.8 34.3 35.4  RDW 16.6* 16.4* 16.4*  LYMPHSABS 0.7 0.8  --   MONOABS 0.3 0.3  --   EOSABS 0.0 0.0  --   BASOSABS 0.0 0.0  --    ------------------------------------------------------------------------------------------------------------------  Chemistries  Recent Labs  Lab 08/15/21 2026 08/17/21 0830  NA 145 139  K 3.6 3.8  CL 109 98  CO2 27 27  GLUCOSE 126* 113*  BUN 12 13  CREATININE 0.86 0.94  CALCIUM 9.9 9.7  AST 13* 24  ALT 11 11  ALKPHOS 60 54  BILITOT 1.7* 1.9*   ------------------------------------------------------------------------------------------------------------------ estimated creatinine clearance is 82.7  mL/min (by C-G formula based on SCr of 0.94 mg/dL). ------------------------------------------------------------------------------------------------------------------ No results for input(s): TSH, T4TOTAL, T3FREE, THYROIDAB in the last 72 hours.  Invalid input(s): FREET3  Coagulation profile No results for input(s): INR, PROTIME in the last 168 hours. ------------------------------------------------------------------------------------------------------------------- No results for input(s): DDIMER in the last 72 hours. -------------------------------------------------------------------------------------------------------------------  Cardiac Enzymes No results for input(s): CKMB, TROPONINI, MYOGLOBIN in the last 168 hours.  Invalid input(s): CK ------------------------------------------------------------------------------------------------------------------ No results found for: BNP  ---------------------------------------------------------------------------------------------------------------  Urinalysis    Component Value Date/Time   COLORURINE YELLOW 08/17/2021 1206   APPEARANCEUR CLEAR 08/17/2021 1206   LABSPEC <1.005 (L) 08/17/2021 1206   PHURINE 7.0 08/17/2021 1206   GLUCOSEU NEGATIVE 08/17/2021 1206   HGBUR NEGATIVE 08/17/2021 1206   BILIRUBINUR NEGATIVE 08/17/2021 1206   KETONESUR 15 (A) 08/17/2021 1206   PROTEINUR NEGATIVE 08/17/2021 1206   UROBILINOGEN 1.0 07/20/2007 1259   NITRITE NEGATIVE 08/17/2021 1206   LEUKOCYTESUR NEGATIVE 08/17/2021 1206    ----------------------------------------------------------------------------------------------------------------   Imaging Results:    DG Chest 2 View  Result Date: 08/17/2021 CLINICAL DATA:  Fevers. EXAM: CHEST - 2 VIEW COMPARISON:  08/15/2021 FINDINGS: Heart size and mediastinal contours are normal. No pleural effusion or edema. No airspace opacities identified. Previous left shoulder arthroplasty. IMPRESSION: No  active cardiopulmonary abnormalities. Electronically Signed   By: Signa Kell M.D.   On: 08/17/2021 10:38   CT ABDOMEN PELVIS W CONTRAST  Result Date: 08/17/2021 CLINICAL DATA:  Left upper quadrant abdominal pain. History of sickle cell disease. EXAM: CT ABDOMEN AND PELVIS WITH CONTRAST TECHNIQUE: Multidetector CT imaging of the abdomen and pelvis was performed using the standard protocol following bolus administration of intravenous contrast. CONTRAST:  36mL OMNIPAQUE IOHEXOL 350 MG/ML SOLN COMPARISON:  CT 04/10/2020, 04/15/2015 FINDINGS: Lower chest: Mild dependent left basilar scarring or atelectasis. 4 mm subpleural nodule in the posterior right lower lobe is unchanged from 2016, benign. Heart size is normal. Hepatobiliary: Redemonstrated scattered tiny subcentimeter low-density lesions within the liver, likely benign cysts or hamartomas. No new focal liver abnormality. Contracted gallbladder containing multiple stones. Mild intrahepatic biliary dilatation is similar to prior. Pancreas: Unremarkable. No pancreatic ductal dilatation or surrounding inflammatory changes. Spleen: Spleen is enlarged, similar in size and appearance compared to prior. Spleen enhances homogeneously without evidence of a splenic infarction. Adrenals/Urinary Tract: Unremarkable adrenal glands. 1.5 cm left renal cyst. Kidneys appear otherwise unremarkable. No renal stone or hydronephrosis. Urinary bladder  is unremarkable. Stomach/Bowel: Stomach is within normal limits. Appendix appears normal. Sigmoid diverticulosis. No evidence of bowel wall thickening, distention, or inflammatory changes. Vascular/Lymphatic: No significant vascular findings are present. No enlarged abdominal or pelvic lymph nodes. Reproductive: Prostate is unremarkable. Other: No free fluid. No abdominopelvic fluid collection. No pneumoperitoneum. No abdominal wall hernia. 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 No new or acute  osseous findings. IMPRESSION: 1. No acute abdominopelvic findings. 2. Cholelithiasis. 3. Sigmoid diverticulosis without evidence of acute diverticulitis. 4. Splenomegaly. 5. 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 Electronically Signed   By: Duanne Guess D.O.   On: 08/17/2021 10:41     Assessment & Plan:  Principal Problem:   Sickle cell crisis (HCC) Active Problems:   Anemia of chronic disease   Non-intractable vomiting   Chronic pain syndrome  Sickle cell disease with pain crisis: Admit to MedSurg. Continue IV fluids, 0.45% saline at 125 mL/h IV Dilaudid PCA initiated Toradol 15 mg IV every 6 hours Monitor vital signs very closely, reevaluate pain scale regularly, and supplemental oxygen as needed  Non-intractable vomiting: Vomiting has currently resolved.  Advance diet as tolerated.  Continue antiemetics.  Monitor closely.  Labs in AM.  Anemia of chronic disease: Hemoglobin is 11.3, consistent with patient's baseline.  There is no clinical indication for blood transfusion at this time.  Continue to follow closely.  Chronic pain syndrome: Continue home medications  DVT Prophylaxis: Subcut Lovenox   AM Labs Ordered, also please review Full Orders  Family Communication: Admission, patient's condition and plan of care including tests being ordered have been discussed with the patient who indicate understanding and agree with the plan and Code Status.  Code Status: Full Code  Consults called: None    Admission status: Inpatient    Time spent in minutes : 30 minutes  Nolon Nations  APRN, MSN, FNP-C Patient [facility] [address] [place], [place] [address] [phone]  08/18/2021 at 5:45 AM

## 2021-08-17 NOTE — ED Provider Notes (Signed)
[facility]-EMERGENCY DEPT Provider Note   CSN: [id number] Arrival date & time: 08/17/21  0734     History Chief Complaint  Patient presents with   Abdominal Pain   Sickle Cell Pain Crisis    [name] is a 50 y.o. male.   Abdominal Pain Associated symptoms: fatigue, fever, nausea and vomiting   Associated symptoms: no chest pain, no chills, no cough, no dysuria, no hematuria, no shortness of breath and no sore throat   Sickle Cell Pain Crisis Associated symptoms: congestion, fatigue, fever, nausea and vomiting   Associated symptoms: no chest pain, no cough, no shortness of breath and no sore throat   Patient presents for abdominal pain, nausea, vomiting for 3 days.  History notable for sickle cell disease.  He was seen in the ED 2 days ago for nausea, vomiting, and arm pain.  At that time, he was given Zofran, Benadryl, Dilaudid.  He had improvement in symptoms.  He was discharged with prescriptions for Norco and Carafate.  He presents today for persistent symptoms.  These reportedly recurred shortly after his last ED visit.  He has had p.o. intolerance for the past 2 days.  He continues to have bilateral arm pain.  Anytime he tries to eat or drink, he vomits.  He has continued abdominal pain that is greatest in the left upper quadrant.  He has not been able to tolerate p.o. medications at home.  He has had subjective fevers.  He denies any cough.  He has had mucus production that is described as dark in color.  His urine is also described as dark in color.    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-       09-16-2020   Low testosterone    Mental disorder    Mixed restrictive and  obstructive lung disease (HCC)    Osteoarthrosis, unspecified whether generalized or localized, unspecified site    Pain in joint, site unspecified    Physical exam, annual 02/18/2010   Sickle cell anemia (HCC)    Tobacco use     Patient Active Problem List   Diagnosis Date Noted   Sickle cell crisis (HCC) 08/17/2021   Nuclear sclerotic cataract of left eye 12/03/2020   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   Vitreomacular adhesion of left eye 06/15/2020   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   Nondiabetic proliferative retinopathy, right 06/15/2020   Traction detachment of right retina 06/15/2020   Vitreous hemorrhage of left eye (HCC) 06/15/2020   Gallstones    Generalized abdominal pain    Sickle cell anemia (HCC)    Non-intractable vomiting    Sickle cell pain crisis (HCC) 04/10/2020   Postop check    Sickle cell anemia with pain (HCC) 03/19/2020   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/18/2020   Sickle cell anemia with crisis (HCC) 03/15/2016   Thrombocytopenia (HCC) 01/02/2013   Anemia 01/02/2013   Hemoglobin [place] with crisis (HCC) 01/02/2013   Epigastric abdominal pain 01/02/2013   Fever of unknown origin 01/02/2013   Abdominal pain, left upper quadrant 01/02/2013   Avascular necrosis of left femoral head (HCC)    Erectile dysfunction    Low testosterone  Mixed restrictive and obstructive lung disease (HCC)    Gastritis and duodenitis    Impotence of organic origin    Osteoarthrosis, unspecified whether generalized or localized, unspecified site    Pain in joint, site unspecified     Past Surgical History:  Procedure Laterality Date   ELECTROCARDIOGRAM  01/31/2011   ESOPHAGOGASTRODUODENOSCOPY  01/03/2013   Procedure: ESOPHAGOGASTRODUODENOSCOPY (EGD);  Surgeon: Meryl Dare, MD,FACG;  Location: [facility];  Service: Endoscopy;  Laterality: N/A;   EYE SURGERY Left 09/16/2020   Membrane Peel and Vitrectomy, Dr. Luciana Axe    TOTAL SHOULDER ARTHROPLASTY Left 03/18/2020   Procedure: TOTAL SHOULDER ARTHROPLASTY;  Surgeon: Bjorn Pippin, MD;  Location: [facility];  Service: Orthopedics;  Laterality: Left;   UPPER GASTROINTESTINAL ENDOSCOPY  11/18/2010       History reviewed. No pertinent family history.  Social History   Tobacco Use   Smoking status: Former   Smokeless tobacco: Never  Building services engineer Use: Never used  Substance Use Topics   Alcohol use: Yes    Alcohol/week: 0.0 standard drinks    Comment: OCCASIONAL   Drug use: No    Home Medications Prior to Admission medications   Medication Sig Start Date End Date Taking? Authorizing Provider  acetaminophen (TYLENOL) 500 MG tablet Take 1,000 mg by mouth every 4 (four) hours as needed for mild pain.   Yes [provider]  celecoxib (CELEBREX) 200 MG capsule Take 200 mg by mouth 2 (two) times daily. 07/26/21  Yes [provider]  Cholecalciferol (VITAMIN D3) 250 MCG (10000 UT) TABS Take 10,000 Units by mouth daily.   Yes [provider]  diphenhydrAMINE (BENADRYL) 25 MG tablet Take 25 mg by mouth daily as needed for allergies.   Yes [provider]  ferrous sulfate 325 (65 FE) MG tablet Take 325 mg by mouth daily with breakfast.   Yes [provider]  folic acid (FOLVITE) 1 MG tablet Take 1 mg by mouth daily. 06/09/21  Yes [provider]  ibuprofen (ADVIL) 800 MG tablet Take 800 mg by mouth 3 (three) times daily as needed for pain. 07/22/21  Yes [provider]  Omega-3 Fatty Acids (FISH OIL) 1000 MG CAPS Take 1,000 mg by mouth daily.   Yes [provider]  Oxycodone HCl 10 MG TABS Take 10-20 mg by mouth every 4 (four) hours as needed (pain). 08/05/21  Yes [provider]  oxyCODONE-acetaminophen (PERCOCET) 10-325 MG tablet Take 1 tablet by mouth every 6 (six) hours as needed for pain. 01/20/20  Yes [provider]  Zinc 50 MG TABS Take 50 mg by mouth daily.   Yes  [provider]  enoxaparin (LOVENOX) 40 MG/0.4ML injection Inject 0.4 mLs (40 mg total) into the skin daily. 03/20/20 04/19/20  Vernetta Honey, PA-C    Allergies    Patient has no known allergies.  Review of Systems   Review of Systems  Constitutional:  Positive for activity change, appetite change, fatigue and fever. Negative for chills.  HENT:  Positive for congestion and sinus pain. Negative for ear pain and sore throat.   Eyes:  Negative for pain and visual disturbance.  Respiratory:  Negative for cough and shortness of breath.   Cardiovascular:  Negative for chest pain and palpitations.  Gastrointestinal:  Positive for abdominal pain, nausea and vomiting.  Genitourinary:  Negative for dysuria and hematuria.  Musculoskeletal:  Negative for arthralgias and back pain.  Skin:  Negative for color change and  rash.  Neurological:  Negative for seizures and syncope.  All other systems reviewed and are negative.  Physical Exam Updated Vital Signs BP 124/71 (BP Location: Left Arm)   Pulse 88   Temp 98.8 F (37.1 C) (Oral)   Resp 16   Ht 5\' 5"  (1.651 m)   Wt 68 kg   SpO2 100%   BMI 24.96 kg/m   Physical Exam Vitals and nursing note reviewed.  Constitutional:      General: He is not in acute distress.    Appearance: He is well-developed and normal weight. He is not ill-appearing, toxic-appearing or diaphoretic.  HENT:     Head: Normocephalic and atraumatic.     Mouth/Throat:     Mouth: Mucous membranes are moist.     Pharynx: Oropharynx is clear.  Eyes:     Conjunctiva/sclera: Conjunctivae normal.     Pupils: Pupils are equal, round, and reactive to light.  Cardiovascular:     Rate and Rhythm: Normal rate and regular rhythm.     Heart sounds: No murmur heard. Pulmonary:     Effort: Pulmonary effort is normal. No respiratory distress.     Breath sounds: Normal breath sounds. No wheezing or rales.  Chest:     Chest wall: No tenderness.  Abdominal:      Palpations: Abdomen is soft.     Tenderness: There is generalized abdominal tenderness. There is no right CVA tenderness, left CVA tenderness, guarding or rebound.  Musculoskeletal:     Cervical back: Neck supple.  Skin:    General: Skin is warm and dry.  Neurological:     General: No focal deficit present.     Mental Status: He is alert and oriented to person, place, and time.     Cranial Nerves: No cranial nerve deficit.     Motor: No weakness.  Psychiatric:        Mood and Affect: Mood normal.        Behavior: Behavior normal.    ED Results / Procedures / Treatments   Labs (all labs ordered are listed, but only abnormal results are displayed) Labs Reviewed  COMPREHENSIVE METABOLIC PANEL - Abnormal; Notable for the following components:      Result Value   Glucose, Bld 113 (*)    Total Bilirubin 1.9 (*)    All other components within normal limits  CBC WITH DIFFERENTIAL/PLATELET - Abnormal; Notable for the following components:   Hemoglobin 11.3 (*)    HCT 32.9 (*)    MCV 74.6 (*)    MCH 25.6 (*)    RDW 16.4 (*)    All other components within normal limits  RETICULOCYTES - Abnormal; Notable for the following components:   Retic Ct Pct 3.8 (*)    Immature Retic Fract 28.2 (*)    All other components within normal limits  URINALYSIS, ROUTINE W REFLEX MICROSCOPIC - Abnormal; Notable for the following components:   Specific Gravity, Urine <1.005 (*)    Ketones, ur 15 (*)    All other components within normal limits  RESP PANEL BY RT-PCR (FLU A&B, COVID) ARPGX2  CULTURE, BLOOD (ROUTINE X 2)  LACTIC ACID, PLASMA  HIV ANTIBODY (ROUTINE TESTING W REFLEX)  BASIC METABOLIC PANEL  CBC    EKG EKG Interpretation  Date/Time:  Tuesday August 17 2021 09:31:58 EDT Ventricular Rate:  88 PR Interval:  141 QRS Duration: 111 QT Interval:  368 QTC Calculation: 446 R Axis:   58 Text Interpretation: Sinus rhythm Low voltage,  precordial leads Nonspecific T abnrm, anterolateral  leads Confirmed by Gloris Manchester [phone]) on 08/17/2021 9:36:14 AM  Radiology DG Chest 2 View  Result Date: 08/17/2021 CLINICAL DATA:  Fevers. EXAM: CHEST - 2 VIEW COMPARISON:  08/15/2021 FINDINGS: Heart size and mediastinal contours are normal. No pleural effusion or edema. No airspace opacities identified. Previous left shoulder arthroplasty. IMPRESSION: No active cardiopulmonary abnormalities. Electronically Signed   By: Signa Kell M.D.   On: 08/17/2021 10:38   DG Chest 2 View  Result Date: 08/15/2021 CLINICAL DATA:  Sickle cell.  Vomiting. EXAM: CHEST - 2 VIEW COMPARISON:  Chest x-ray 04/10/2020. FINDINGS: The heart size and mediastinal contours are within normal limits. Both lungs are clear. Left shoulder arthroplasty is present. IMPRESSION: No active cardiopulmonary disease. Electronically Signed   By: Darliss Cheney M.D.   On: 08/15/2021 20:08   DG Elbow 2 Views Right  Result Date: 08/15/2021 CLINICAL DATA:  Elbow pain. EXAM: RIGHT ELBOW - 2 VIEW COMPARISON:  Right elbow x-ray 11/23/2005. FINDINGS: There is no evidence of fracture, dislocation, or joint effusion. There is no evidence of arthropathy or other focal bone abnormality. Soft tissues are unremarkable. IMPRESSION: Negative. Electronically Signed   By: Darliss Cheney M.D.   On: 08/15/2021 20:09   CT ABDOMEN PELVIS W CONTRAST  Result Date: 08/17/2021 CLINICAL DATA:  Left upper quadrant abdominal pain. History of sickle cell disease. EXAM: CT ABDOMEN AND PELVIS WITH CONTRAST TECHNIQUE: Multidetector CT imaging of the abdomen and pelvis was performed using the standard protocol following bolus administration of intravenous contrast. CONTRAST:  49mL OMNIPAQUE IOHEXOL 350 MG/ML SOLN COMPARISON:  CT 04/10/2020, 04/15/2015 FINDINGS: Lower chest: Mild dependent left basilar scarring or atelectasis. 4 mm subpleural nodule in the posterior right lower lobe is unchanged from 2016, benign. Heart size is normal. Hepatobiliary: Redemonstrated scattered  tiny subcentimeter low-density lesions within the liver, likely benign cysts or hamartomas. No new focal liver abnormality. Contracted gallbladder containing multiple stones. Mild intrahepatic biliary dilatation is similar to prior. Pancreas: Unremarkable. No pancreatic ductal dilatation or surrounding inflammatory changes. Spleen: Spleen is enlarged, similar in size and appearance compared to prior. Spleen enhances homogeneously without evidence of a splenic infarction. Adrenals/Urinary Tract: Unremarkable adrenal glands. 1.5 cm left renal cyst. Kidneys appear otherwise unremarkable. No renal stone or hydronephrosis. Urinary bladder is unremarkable. Stomach/Bowel: Stomach is within normal limits. Appendix appears normal. Sigmoid diverticulosis. No evidence of bowel wall thickening, distention, or inflammatory changes. Vascular/Lymphatic: No significant vascular findings are present. No enlarged abdominal or pelvic lymph nodes. Reproductive: Prostate is unremarkable. Other: No free fluid. No abdominopelvic fluid collection. No pneumoperitoneum. No abdominal wall hernia. 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 No new or acute osseous findings. IMPRESSION: 1. No acute abdominopelvic findings. 2. Cholelithiasis. 3. Sigmoid diverticulosis without evidence of acute diverticulitis. 4. Splenomegaly. 5. 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 Electronically Signed   By: Duanne Guess D.O.   On: 08/17/2021 10:41    Procedures Procedures   Medications Ordered in ED Medications  0.45 % sodium chloride infusion ( Intravenous New Bag/Given 08/17/21 0929)  oxyCODONE (Oxy IR/ROXICODONE) immediate release tablet 10 mg (has no administration in time range)  folic acid (FOLVITE) tablet 1 mg (1 mg Oral Given 08/17/21 1405)  ferrous sulfate tablet 325 mg (325 mg Oral Given 08/17/21 1405)  cholecalciferol (VITAMIN D) tablet 10,000 Units (has no administration in time range)   omega-3 acid ethyl esters (LOVAZA) capsule 1 g (1 g Oral Given 08/17/21 1405)  senna-docusate (Senokot-S)  tablet 1 tablet (has no administration in time range)  polyethylene glycol (MIRALAX / GLYCOLAX) packet 17 g (has no administration in time range)  enoxaparin (LOVENOX) injection 40 mg (has no administration in time range)  ketorolac (TORADOL) 15 MG/ML injection 15 mg (15 mg Intravenous Not Given 08/17/21 1700)  naloxone (NARCAN) injection 0.4 mg (has no administration in time range)    And  sodium chloride flush (NS) 0.9 % injection 9 mL (has no administration in time range)  ondansetron (ZOFRAN) injection 4 mg (has no administration in time range)  diphenhydrAMINE (BENADRYL) capsule 25 mg (has no administration in time range)    Or  diphenhydrAMINE (BENADRYL) 25 mg in sodium chloride 0.9 % 50 mL IVPB (has no administration in time range)  HYDROmorphone (DILAUDID) 1 mg/mL PCA injection ( Intravenous Set-up / Initial Syringe 08/17/21 1406)  HYDROmorphone (DILAUDID) injection 2 mg (2 mg Subcutaneous Given 08/17/21 0846)  HYDROmorphone (DILAUDID) injection 2 mg (2 mg Subcutaneous Given 08/17/21 0926)  sodium chloride 0.9 % bolus 1,000 mL (0 mLs Intravenous Stopped 08/17/21 0946)  iohexol (OMNIPAQUE) 350 MG/ML injection 80 mL (80 mLs Intravenous Contrast Given 08/17/21 0956)    ED Course  I have reviewed the triage vital signs and the nursing notes.  Pertinent labs & imaging results that were available during my care of the patient were reviewed by me and considered in my medical decision making (see chart for details).    MDM Rules/Calculators/A&P                           Patient presents for nausea, vomiting, and abdominal pain.  Symptoms have been present for the past 3 days.  He had brief resolution during ED visit 2 days ago.  Prior to being bedded in the ED, work-up was initiated.  Vital signs notable for a temperature of 100.3 degrees.  On arrival, patient appears uncomfortable.  He  endorses pain in his abdomen and bilateral arms.  EKG shows no signs of ischemia.  IV access was obtained.  Patient was given IV fluids, given his p.o. intolerance.  Pain and nausea medications were ordered.  In addition to lab work, CT scan of abdomen pelvis was ordered.  Lab work-up showed baseline anemia, no leukocytosis, baseline reticulocytes, baseline bilirubin, and normal electrolytes and transaminases.  CT scan showed no acute findings.  On reassessment, patient reported improvement in his pain and resolution of his nausea.  He was given p.o. challenge.  He was able to drink a cup of water.  He continued to have 6/10 severity pain.  Patient was agreeable to observation in the hospital overnight to continue to treat pain and nausea in addition to monitoring for any recurrence of fever.  Patient was admitted for further management.  Final Clinical Impression(s) / ED Diagnoses Final diagnoses:  Left upper quadrant abdominal pain    Rx / DC Orders ED Discharge Orders     None        Gloris Manchester, MD 08/17/21 1702

## 2021-08-17 NOTE — ED Notes (Signed)
MD notified of inability to obtain second culture

## 2021-08-17 NOTE — ED Triage Notes (Addendum)
Pt states that he has had abdominal pain, N/V, and dark urine since Sunday. Pt also has sickle cell and feels like he may be in crisis. Pt also adds that he had L elbow surgery 2 weeks ago. Alert and oriented.

## 2021-08-18 DIAGNOSIS — G894 Chronic pain syndrome: Secondary | ICD-10-CM

## 2021-08-18 DIAGNOSIS — D5700 Hb-SS disease with crisis, unspecified: Secondary | ICD-10-CM | POA: Diagnosis not present

## 2021-08-18 LAB — CBC
HCT: 31.4 % — ABNORMAL LOW (ref 39.0–52.0)
Hemoglobin: 11.1 g/dL — ABNORMAL LOW (ref 13.0–17.0)
MCH: 26 pg (ref 26.0–34.0)
MCHC: 35.4 g/dL (ref 30.0–36.0)
MCV: 73.5 fL — ABNORMAL LOW (ref 80.0–100.0)
Platelets: 179 10*3/uL (ref 150–400)
RBC: 4.27 MIL/uL (ref 4.22–5.81)
RDW: 16.4 % — ABNORMAL HIGH (ref 11.5–15.5)
WBC: 3.7 10*3/uL — ABNORMAL LOW (ref 4.0–10.5)
nRBC: 0 % (ref 0.0–0.2)

## 2021-08-18 LAB — BASIC METABOLIC PANEL
Anion gap: 7 (ref 5–15)
BUN: 9 mg/dL (ref 6–20)
CO2: 26 mmol/L (ref 22–32)
Calcium: 8.8 mg/dL — ABNORMAL LOW (ref 8.9–10.3)
Chloride: 103 mmol/L (ref 98–111)
Creatinine, Ser: 0.86 mg/dL (ref 0.61–1.24)
GFR, Estimated: >60 mL/min (ref >60)
Glucose, Bld: 113 mg/dL — ABNORMAL HIGH (ref 70–99)
Potassium: 3.5 mmol/L (ref 3.5–5.1)
Sodium: 136 mmol/L (ref 135–145)

## 2021-08-18 LAB — HIV ANTIBODY (ROUTINE TESTING W REFLEX): HIV Screen 4th Generation wRfx: NONREACTIVE

## 2021-08-18 MED ORDER — ONDANSETRON HCL 4 MG PO TABS: 4.0000 mg | ORAL_TABLET | Freq: Three times a day (TID) | ORAL | 0 refills | Status: AC | PRN

## 2021-08-18 NOTE — Progress Notes (Signed)
Discharge order placed.  Pt wife at bedside to transport pt home.   Pt requesting prn sublingual zofran for nausea.  NP [place] messaged via secure chat, order placed.   Paperwork printed and reviewed with pt and wife.  PIV removed.   No other questions or needs at this time.

## 2021-08-18 NOTE — Discharge Summary (Signed)
Physician Discharge Summary  [name] HWK:[id number] DOB: [birth year] DOA: 08/17/2021  PCP: Leonard Bender, MD  Admit date: 08/17/2021  Discharge date: 08/18/2021  Discharge Diagnoses:  Principal Problem:   Sickle cell crisis (HCC) Active Problems:   Anemia of chronic disease   Non-intractable vomiting   Chronic pain syndrome   Discharge Condition: Stable  Disposition:  Pt is discharged home in good condition and is to follow up with Leonard Bender, MD this week to have labs evaluated. [name] is instructed to increase activity slowly and balance with rest for the next few days, and use prescribed medication to complete treatment of pain  Diet: Regular Wt Readings from Last 3 Encounters:  08/17/21 68 kg  04/09/20 70.3 kg  03/19/20 70.3 kg    History of present illness:   [name]  is a 50 y.o. male with a medical history significant for sickle cell disease, chronic pain syndrome, opiate dependence and tolerance presents to the ER with abdominal pain, vomiting, and left elbow pain over the past 3 days.  Of note, patient was treated and evaluated in the emergency room 2 days prior for nausea, vomiting, and arm pain.  Patient received Zofran, Dilaudid, and Benadryl and was discharged home at that time.  Patient states that nausea and vomiting returned after eating.  Patient's wife reports some sick contacts.  He has had oral intolerance over the past several days and has not been able to keep down food or medications.  Patient continues to have bilateral arm pain, primarily to left side.  He is status post left elbow surgery 2 weeks ago.  At this time, patient says that nausea and vomiting have resolved.  Patient is currently eating dinner and appears to be tolerating well.  He continues to complain of pain primarily to left elbow and rates pain as 7/10.  He denies fever, chills, headache, chest pain, or shortness of breath.  No dizziness,  paresthesias, diarrhea, or constipation.  No recent travel, or known exposure to COVID-19.   ER Course:  Vital signs show: BP (!) 161/90 (BP Location: Left Arm)   Pulse 72   Temp 99.2 F (37.3 C) (Oral)   Resp 17   Ht 5\' 5"  (1.651 m)   Wt 68 kg   SpO2 100%   BMI 24.96 kg/m .  CT of abdomen and pelvis shows no acute abdominal pelvic findings, cholelithiasis, sigmoid diverticulosis without evidence of acute diverticulitis, splenomegaly, and chronic osseous manifestations of sickle cell disease.  Chest x-ray shows no acute cardiopulmonary findings.  COVID-19 test negative.  Lactic acid negative.  CBC shows a hemoglobin of 11.3, otherwise unremarkable.  Total bilirubin 1.9, otherwise unremarkable.  Pain persists despite IV Dilaudid, will admit for further management nausea and vomiting in the setting of sickle cell crisis Hospital Course:  Sickle cell pain:  Patient was admitted for sickle cell pain crisis and managed appropriately with IVF, IV Dilaudid via PCA and IV Toradol, as well as other adjunct therapies per sickle cell pain management protocols. Patient was transitioned to home medications. He is not having any pain on today and is requesting discharge home.   Nausea/vomiting:  Resolved. Patient denies any nausea, vomiting, or diarrhea. No etiology identified. More than likely viral, patient endorses sick contacts.  Continue hydration and advance diet as tolerated.   Patient was therefore discharged home today in a hemodynamically stable condition.   [name] will follow-up with PCP within 1 week of this  discharge. [name] was counseled extensively about nonpharmacologic means of pain management, patient verbalized understanding and was appreciative of  the care received during this admission.   We discussed the need for good hydration, monitoring of hydration status, avoidance of heat, cold, stress, and infection triggers. We discussed the need to be adherent with taking home  medications. Patient was reminded of the need to seek medical attention immediately if any symptom of bleeding, anemia, or infection occurs.  Discharge Exam: Vitals:   08/18/21 0829 08/18/21 1000  BP:  134/83  Pulse:  84  Resp: 17 18  Temp:  98.2 F (36.8 C)  SpO2: 100% 100%   Vitals:   08/18/21 0431 08/18/21 0508 08/18/21 0829 08/18/21 1000  BP: (!) 161/90   134/83  Pulse: 72   84  Resp: Temp: 99.2 F (37.3 C)   98.2 F (36.8 C)  TempSrc: Oral   Oral  SpO2: 100% 100% 100% 100%  Weight:      Height:        General appearance : Awake, alert, not in any distress. Speech Clear. Not toxic looking HEENT: Atraumatic and Normocephalic, pupils equally reactive to light and accomodation Neck: Supple, no JVD. No cervical lymphadenopathy.  Chest: Good air entry bilaterally, no added sounds  CVS: S1 S2 regular, no murmurs.  Abdomen: Bowel sounds present, Non tender and not distended with no gaurding, rigidity or rebound. Extremities: B/L Lower Ext shows no edema, both legs are warm to touch Neurology: Awake alert, and oriented X 3, CN II-XII intact, Non focal Skin: No Rash  Discharge Instructions  Discharge Instructions     Discharge patient   Complete by: As directed    Discharge disposition: 01-Home or Self Care   Discharge patient date: 08/18/2021      Allergies as of 08/18/2021   No Known Allergies      Medication List     TAKE these medications    acetaminophen 500 MG tablet Commonly known as: TYLENOL Take 1,000 mg by mouth every 4 (four) hours as needed for mild pain.   celecoxib 200 MG capsule Commonly known as: CELEBREX Take 200 mg by mouth 2 (two) times daily.   diphenhydrAMINE 25 MG tablet Commonly known as: BENADRYL Take 25 mg by mouth daily as needed for allergies.   enoxaparin 40 MG/0.4ML injection Commonly known as: LOVENOX Inject 0.4 mLs (40 mg total) into the skin daily.   ferrous sulfate 325 (65 FE) MG tablet Take 325 mg by  mouth daily with breakfast.   Fish Oil 1000 MG Caps Take 1,000 mg by mouth daily.   folic acid 1 MG tablet Commonly known as: FOLVITE Take 1 mg by mouth daily.   ibuprofen 800 MG tablet Commonly known as: ADVIL Take 800 mg by mouth 3 (three) times daily as needed for pain.   Oxycodone HCl 10 MG Tabs Take 10-20 mg by mouth every 4 (four) hours as needed (pain).   oxyCODONE-acetaminophen 10-325 MG tablet Commonly known as: PERCOCET Take 1 tablet by mouth every 6 (six) hours as needed for pain.   Vitamin D3 250 MCG (10000 UT) Tabs Take 10,000 Units by mouth daily.   Zinc 50 MG Tabs Take 50 mg by mouth daily.        The results of significant diagnostics from this hospitalization (including imaging, microbiology, ancillary and laboratory) are listed below for reference.    Significant Diagnostic Studies: DG Chest 2 View  Result Date: 08/17/2021 CLINICAL  DATA:  Fevers. EXAM: CHEST - 2 VIEW COMPARISON:  08/15/2021 FINDINGS: Heart size and mediastinal contours are normal. No pleural effusion or edema. No airspace opacities identified. Previous left shoulder arthroplasty. IMPRESSION: No active cardiopulmonary abnormalities. Electronically Signed   By: Signa Kell M.D.   On: 08/17/2021 10:38   DG Chest 2 View  Result Date: 08/15/2021 CLINICAL DATA:  Sickle cell.  Vomiting. EXAM: CHEST - 2 VIEW COMPARISON:  Chest x-ray 04/10/2020. FINDINGS: The heart size and mediastinal contours are within normal limits. Both lungs are clear. Left shoulder arthroplasty is present. IMPRESSION: No active cardiopulmonary disease. Electronically Signed   By: Darliss Cheney M.D.   On: 08/15/2021 20:08   DG Elbow 2 Views Right  Result Date: 08/15/2021 CLINICAL DATA:  Elbow pain. EXAM: RIGHT ELBOW - 2 VIEW COMPARISON:  Right elbow x-ray 11/23/2005. FINDINGS: There is no evidence of fracture, dislocation, or joint effusion. There is no evidence of arthropathy or other focal bone abnormality. Soft tissues  are unremarkable. IMPRESSION: Negative. Electronically Signed   By: Darliss Cheney M.D.   On: 08/15/2021 20:09   CT ABDOMEN PELVIS W CONTRAST  Result Date: 08/17/2021 CLINICAL DATA:  Left upper quadrant abdominal pain. History of sickle cell disease. EXAM: CT ABDOMEN AND PELVIS WITH CONTRAST TECHNIQUE: Multidetector CT imaging of the abdomen and pelvis was performed using the standard protocol following bolus administration of intravenous contrast. CONTRAST:  74mL OMNIPAQUE IOHEXOL 350 MG/ML SOLN COMPARISON:  CT 04/10/2020, 04/15/2015 FINDINGS: Lower chest: Mild dependent left basilar scarring or atelectasis. 4 mm subpleural nodule in the posterior right lower lobe is unchanged from 2016, benign. Heart size is normal. Hepatobiliary: Redemonstrated scattered tiny subcentimeter low-density lesions within the liver, likely benign cysts or hamartomas. No new focal liver abnormality. Contracted gallbladder containing multiple stones. Mild intrahepatic biliary dilatation is similar to prior. Pancreas: Unremarkable. No pancreatic ductal dilatation or surrounding inflammatory changes. Spleen: Spleen is enlarged, similar in size and appearance compared to prior. Spleen enhances homogeneously without evidence of a splenic infarction. Adrenals/Urinary Tract: Unremarkable adrenal glands. 1.5 cm left renal cyst. Kidneys appear otherwise unremarkable. No renal stone or hydronephrosis. Urinary bladder is unremarkable. Stomach/Bowel: Stomach is within normal limits. Appendix appears normal. Sigmoid diverticulosis. No evidence of bowel wall thickening, distention, or inflammatory changes. Vascular/Lymphatic: No significant vascular findings are present. No enlarged abdominal or pelvic lymph nodes. Reproductive: Prostate is unremarkable. Other: No free fluid. No abdominopelvic fluid collection. No pneumoperitoneum. No abdominal wall hernia. 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 No new or acute  osseous findings. IMPRESSION: 1. No acute abdominopelvic findings. 2. Cholelithiasis. 3. Sigmoid diverticulosis without evidence of acute diverticulitis. 4. Splenomegaly. 5. 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 Electronically Signed   By: Duanne Guess D.O.   On: 08/17/2021 10:41   ECHOCARDIOGRAM COMPLETE  Result Date: 07/20/2021    ECHOCARDIOGRAM REPORT   Patient Name:   [facility] [address] [facility] Date of Exam: 07/20/2021 Medical Rec #:  [id number]                        Height:       65.0 in Accession #:    [id number]                       Weight:       155.0 lb Date of Birth:  17-Feb-1971  BSA:          1.775 m Patient Age:    49 years                         BP:           150/83 mmHg Patient Gender: M                                HR:           84 bpm. Exam Location:  Outpatient Procedure: 2D Echo, Cardiac Doppler and Color Doppler Indications:    Murmur  History:        Patient has no prior history of Echocardiogram examinations.                 Risk Factors:Current Smoker.  Sonographer:    Eulah Pont RDCS Referring Phys: [phone] ERIC L DEAN IMPRESSIONS  1. Left ventricular ejection fraction, by estimation, is 60 to 65%. The left ventricle has normal function. The left ventricle has no regional wall motion abnormalities. Left ventricular diastolic parameters were normal.  2. Right ventricular systolic function is normal. The right ventricular size is mildly enlarged.  3. There is suspicion for moderate size secundum atrial septal defect with left to right shunt. Evidence of atrial level shunting detected by color flow Doppler.  4. The mitral valve is normal in structure. No evidence of mitral valve regurgitation. No evidence of mitral stenosis.  5. The aortic valve is normal in structure. Aortic valve regurgitation is not visualized. No aortic stenosis is present.  6. The inferior vena cava is normal in size with greater than 50% respiratory variability,  suggesting right atrial pressure of 3 mmHg. Conclusion(s)/Recommendation(s): Possible secundum ASD. Consider TEE. FINDINGS  Left Ventricle: Left ventricular ejection fraction, by estimation, is 60 to 65%. The left ventricle has normal function. The left ventricle has no regional wall motion abnormalities. The left ventricular internal cavity size was normal in size. There is  no left ventricular hypertrophy. Left ventricular diastolic parameters were normal. Right Ventricle: The right ventricular size is mildly enlarged. No increase in right ventricular wall thickness. Right ventricular systolic function is normal. Left Atrium: Left atrial size was normal in size. Right Atrium: Right atrial size was normal in size. Pericardium: There is no evidence of pericardial effusion. Mitral Valve: The mitral valve is normal in structure. No evidence of mitral valve regurgitation. No evidence of mitral valve stenosis. Tricuspid Valve: The tricuspid valve is normal in structure. Tricuspid valve regurgitation is not demonstrated. No evidence of tricuspid stenosis. Aortic Valve: The aortic valve is normal in structure. Aortic valve regurgitation is not visualized. No aortic stenosis is present. Pulmonic Valve: The pulmonic valve was normal in structure. Pulmonic valve regurgitation is trivial. No evidence of pulmonic stenosis. Aorta: The aortic root is normal in size and structure. Venous: The inferior vena cava is normal in size with greater than 50% respiratory variability, suggesting right atrial pressure of 3 mmHg. IAS/Shunts: Evidence of atrial level shunting detected by color flow Doppler.  LEFT VENTRICLE PLAX 2D LVIDd:         3.80 cm      Diastology LVIDs:         2.80 cm      LV e' medial:    7.36 cm/s LV PW:         1.10 cm  LV E/e' medial:  8.0 LV IVS:        1.00 cm      LV e' lateral:   9.90 cm/s LVOT diam:     1.97 cm      LV E/e' lateral: 5.9 LV SV:         54 LV SV Index:   30 LVOT Area:     3.04 cm  LV  Volumes (MOD) LV vol d, MOD A2C: 86.4 ml LV vol d, MOD A4C: 139.0 ml LV vol s, MOD A2C: 39.8 ml LV vol s, MOD A4C: 64.6 ml LV SV MOD A2C:     46.6 ml LV SV MOD A4C:     139.0 ml LV SV MOD BP:      59.7 ml RIGHT VENTRICLE RV Basal diam:  4.86 cm RV S prime:     11.30 cm/s RVOT diam:      2.64 cm TAPSE (M-mode): 2.0 cm LEFT ATRIUM             Index       RIGHT ATRIUM           Index LA diam:        3.00 cm 1.69 cm/m  RA Area:     17.65 cm 9.94 cm/m LA Vol (A2C):   32.2 ml 18.14 ml/m RA Volume:   34.71 ml  19.56 ml/m LA Vol (A4C):   37.0 ml 20.85 ml/m LA Biplane Vol: 35.3 ml 19.89 ml/m  AORTIC VALVE             PULMONIC VALVE LVOT Vmax:   96.30 cm/s  RVOT Peak grad: 2 mmHg LVOT Vmean:  62.100 cm/s LVOT VTI:    0.176 m  AORTA Ao Root diam: 3.00 cm Ao Asc diam:  2.80 cm MITRAL VALVE MV Area (PHT): 3.06 cm    SHUNTS MV Decel Time: 248 msec    Systemic VTI:  0.18 m MV E velocity: 58.80 cm/s  Systemic Diam: 1.97 cm MV A velocity: 63.20 cm/s  Pulmonic VTI:  0.143 m MV E/A ratio:  0.93        Pulmonic Diam: 2.64 cm                            Qp/Qs:         1.45 Mihai Croitoru MD Electronically signed by Thurmon Fair MD Signature Date/Time: 07/20/2021/12:22:55 PM    Final     Microbiology: Recent Results (from the past 240 hour(s))  Resp Panel by RT-PCR (Flu A&B, Covid) Nasopharyngeal Swab     Status: None   Collection Time: 08/15/21  8:26 PM   Specimen: Nasopharyngeal Swab; Nasopharyngeal(NP) swabs in vial transport medium  Result Value Ref Range Status   SARS Coronavirus 2 by RT PCR NEGATIVE NEGATIVE Final    Comment: (NOTE) SARS-CoV-2 target nucleic acids are NOT DETECTED.  The SARS-CoV-2 RNA is generally detectable in upper respiratory specimens during the acute phase of infection. The lowest concentration of SARS-CoV-2 viral copies this assay can detect is 138 copies/mL. A negative result does not preclude SARS-Cov-2 infection and should not be used as the sole basis for treatment or other  patient management decisions. A negative result may occur with  improper specimen collection/handling, submission of specimen other than nasopharyngeal swab, presence of viral mutation(s) within the areas targeted by this assay, and inadequate number of viral copies(<138 copies/mL). A negative result must be combined with clinical  observations, patient history, and epidemiological information. The expected result is Negative.  Fact Sheet for Patients:  BloggerCourse.com  Fact Sheet for Healthcare Providers:  SeriousBroker.it  This test is no t yet approved or cleared by the [place] FDA and  has been authorized for detection and/or diagnosis of SARS-CoV-2 by FDA under an Emergency Use Authorization (EUA). This EUA will remain  in effect (meaning this test can be used) for the duration of the COVID-19 declaration under Section 564(b)(1) of the Act, 21 U.S.C.section 360bbb-3(b)(1), unless the authorization is terminated  or revoked sooner.       Influenza A by PCR NEGATIVE NEGATIVE Final   Influenza B by PCR NEGATIVE NEGATIVE Final    Comment: (NOTE) The Xpert Xpress SARS-CoV-2/FLU/RSV plus assay is intended as an aid in the diagnosis of influenza from Nasopharyngeal swab specimens and should not be used as a sole basis for treatment. Nasal washings and aspirates are unacceptable for Xpert Xpress SARS-CoV-2/FLU/RSV testing.  Fact Sheet for Patients: BloggerCourse.com  Fact Sheet for Healthcare Providers: SeriousBroker.it  This test is not yet approved or cleared by the [place] FDA and has been authorized for detection and/or diagnosis of SARS-CoV-2 by FDA under an Emergency Use Authorization (EUA). This EUA will remain in effect (meaning this test can be used) for the duration of the COVID-19 declaration under Section 564(b)(1) of the Act, 21 U.S.C. section  360bbb-3(b)(1), unless the authorization is terminated or revoked.  Performed at [facility], [address], [place], [place] [address]   Blood culture (routine x 2)     Status: None (Preliminary result)   Collection Time: 08/17/21  8:30 AM   Specimen: BLOOD  Result Value Ref Range Status   Specimen Description   Final    BLOOD BLOOD RIGHT FOREARM Performed at [facility], [address], [place], [place] [address]    Special Requests   Final    BOTTLES DRAWN AEROBIC AND ANAEROBIC Blood Culture adequate volume Performed at [facility], [address], [place], [place] [address]    Culture   Final    NO GROWTH 1 DAY Performed at [facility], [address], [place], [place] [address]    Report Status PENDING  Incomplete  Resp Panel by RT-PCR (Flu A&B, Covid) Nasopharyngeal Swab     Status: None   Collection Time: 08/17/21 12:16 PM   Specimen: Nasopharyngeal Swab; Nasopharyngeal(NP) swabs in vial transport medium  Result Value Ref Range Status   SARS Coronavirus 2 by RT PCR NEGATIVE NEGATIVE Final    Comment: (NOTE) SARS-CoV-2 target nucleic acids are NOT DETECTED.  The SARS-CoV-2 RNA is generally detectable in upper respiratory specimens during the acute phase of infection. The lowest concentration of SARS-CoV-2 viral copies this assay can detect is 138 copies/mL. A negative result does not preclude SARS-Cov-2 infection and should not be used as the sole basis for treatment or other patient management decisions. A negative result may occur with  improper specimen collection/handling, submission of specimen other than nasopharyngeal swab, presence of viral mutation(s) within the areas targeted by this assay, and inadequate number of viral copies(<138 copies/mL). A negative result must be combined with clinical observations, patient history, and epidemiological information. The expected result is  Negative.  Fact Sheet for Patients:  BloggerCourse.com  Fact Sheet for Healthcare Providers:  SeriousBroker.it  This test is no t yet approved or cleared by the [place] and  has been authorized for  detection and/or diagnosis of SARS-CoV-2 by FDA under an Emergency Use Authorization (EUA). This EUA will remain  in effect (meaning this test can be used) for the duration of the COVID-19 declaration under Section 564(b)(1) of the Act, 21 U.S.C.section 360bbb-3(b)(1), unless the authorization is terminated  or revoked sooner.       Influenza A by PCR NEGATIVE NEGATIVE Final   Influenza B by PCR NEGATIVE NEGATIVE Final    Comment: (NOTE) The Xpert Xpress SARS-CoV-2/FLU/RSV plus assay is intended as an aid in the diagnosis of influenza from Nasopharyngeal swab specimens and should not be used as a sole basis for treatment. Nasal washings and aspirates are unacceptable for Xpert Xpress SARS-CoV-2/FLU/RSV testing.  Fact Sheet for Patients: BloggerCourse.com  Fact Sheet for Healthcare Providers: SeriousBroker.it  This test is not yet approved or cleared by the [place] FDA and has been authorized for detection and/or diagnosis of SARS-CoV-2 by FDA under an Emergency Use Authorization (EUA). This EUA will remain in effect (meaning this test can be used) for the duration of the COVID-19 declaration under Section 564(b)(1) of the Act, 21 U.S.C. section 360bbb-3(b)(1), unless the authorization is terminated or revoked.  Performed at [facility], [address] [place], [place] [address]      Labs: Basic Metabolic Panel: Recent Labs  Lab 08/15/21 2026 08/17/21 0830 08/18/21 0519  NA 145 139 136  K 3.6 3.8 3.5  CL 109 98 103  CO2 GLUCOSE 126* 113* 113*  BUN CREATININE 0.86 0.94 0.86  CALCIUM 9.9 9.7 8.8*   Liver Function  Tests: Recent Labs  Lab 08/15/21 2026 08/17/21 0830  AST 13* 24  ALT 11 11  ALKPHOS 60 54  BILITOT 1.7* 1.9*  PROT 8.4* 7.9  ALBUMIN 4.8 4.8   Recent Labs  Lab 08/15/21 2026  LIPASE 32   No results for input(s): AMMONIA in the last 168 hours. CBC: Recent Labs  Lab 08/15/21 2026 08/17/21 0830 08/18/21 0519  WBC 5.3 6.0 3.7*  NEUTROABS 4.3 4.9  --   HGB 11.2* 11.3* 11.1*  HCT 32.2* 32.9* 31.4*  MCV 75.4* 74.6* 73.5*  PLT 215 206 179   Cardiac Enzymes: No results for input(s): CKTOTAL, CKMB, CKMBINDEX, TROPONINI in the last 168 hours. BNP: Invalid input(s): POCBNP CBG: No results for input(s): GLUCAP in the last 168 hours.  Time coordinating discharge: 30 minutes  Signed: Nolon Nations  APRN, MSN, FNP-C Patient Care [facility] [address] [place], [place] [address] [phone]  [facility] 08/18/2021, 11:04 AM

## 2021-08-18 NOTE — Plan of Care (Signed)

## 2021-08-22 LAB — CULTURE, BLOOD (ROUTINE X 2)
Culture: NO GROWTH
Special Requests: ADEQUATE

## 2021-09-08 ENCOUNTER — Telehealth (HOSPITAL_COMMUNITY): Payer: Self-pay | Admitting: *Deleted

## 2021-09-08 ENCOUNTER — Encounter (HOSPITAL_COMMUNITY): Payer: Self-pay | Admitting: Family Medicine

## 2021-09-08 ENCOUNTER — Non-Acute Institutional Stay (HOSPITAL_COMMUNITY)
Admission: AD | Admit: 2021-09-08 | Discharge: 2021-09-08 | Disposition: A | Discharge status: Home / Self Care | Payer: BC Managed Care – PPO | Source: Ambulatory Visit | Attending: Internal Medicine | Admitting: Internal Medicine

## 2021-09-08 DIAGNOSIS — Z791 Long term (current) use of non-steroidal anti-inflammatories (NSAID): Secondary | ICD-10-CM | POA: Insufficient documentation

## 2021-09-08 DIAGNOSIS — D5700 Hb-SS disease with crisis, unspecified: Secondary | ICD-10-CM | POA: Diagnosis not present

## 2021-09-08 DIAGNOSIS — Z87891 Personal history of nicotine dependence: Secondary | ICD-10-CM | POA: Insufficient documentation

## 2021-09-08 DIAGNOSIS — Z79899 Other long term (current) drug therapy: Secondary | ICD-10-CM | POA: Diagnosis not present

## 2021-09-08 DIAGNOSIS — J449 Chronic obstructive pulmonary disease, unspecified: Secondary | ICD-10-CM | POA: Insufficient documentation

## 2021-09-08 LAB — COMPREHENSIVE METABOLIC PANEL
ALT: 17 U/L (ref 0–44)
AST: 19 U/L (ref 15–41)
Albumin: 4.6 g/dL (ref 3.5–5.0)
Alkaline Phosphatase: 58 U/L (ref 38–126)
Anion gap: 9 (ref 5–15)
BUN: 9 mg/dL (ref 6–20)
CO2: 26 mmol/L (ref 22–32)
Calcium: 9.8 mg/dL (ref 8.9–10.3)
Chloride: 106 mmol/L (ref 98–111)
Creatinine, Ser: 0.77 mg/dL (ref 0.61–1.24)
GFR, Estimated: >60 mL/min (ref >60)
Glucose, Bld: 104 mg/dL — ABNORMAL HIGH (ref 70–99)
Potassium: 4.2 mmol/L (ref 3.5–5.1)
Sodium: 141 mmol/L (ref 135–145)
Total Bilirubin: 1.8 mg/dL — ABNORMAL HIGH (ref 0.3–1.2)
Total Protein: 7.8 g/dL (ref 6.5–8.1)

## 2021-09-08 LAB — CBC WITH DIFFERENTIAL/PLATELET
Abs Immature Granulocytes: 0.02 10*3/uL (ref 0.00–0.07)
Basophils Absolute: 0 10*3/uL (ref 0.0–0.1)
Basophils Relative: 0 %
Eosinophils Absolute: 0 10*3/uL (ref 0.0–0.5)
Eosinophils Relative: 0 %
HCT: 31.6 % — ABNORMAL LOW (ref 39.0–52.0)
Hemoglobin: 11.2 g/dL — ABNORMAL LOW (ref 13.0–17.0)
Immature Granulocytes: 0 %
Lymphocytes Relative: 13 %
Lymphs Abs: 0.7 10*3/uL (ref 0.7–4.0)
MCH: 26.2 pg (ref 26.0–34.0)
MCHC: 35.4 g/dL (ref 30.0–36.0)
MCV: 73.8 fL — ABNORMAL LOW (ref 80.0–100.0)
Monocytes Absolute: 0.3 10*3/uL (ref 0.1–1.0)
Monocytes Relative: 6 %
Neutro Abs: 4.1 10*3/uL (ref 1.7–7.7)
Neutrophils Relative %: 81 %
Platelets: 155 10*3/uL (ref 150–400)
RBC: 4.28 MIL/uL (ref 4.22–5.81)
RDW: 16.9 % — ABNORMAL HIGH (ref 11.5–15.5)
WBC: 5.1 10*3/uL (ref 4.0–10.5)
nRBC: 0 % (ref 0.0–0.2)

## 2021-09-08 LAB — RETICULOCYTES
Immature Retic Fract: 22.8 % — ABNORMAL HIGH (ref 2.3–15.9)
RBC.: 4.27 MIL/uL (ref 4.22–5.81)
Retic Count, Absolute: 152 10*3/uL (ref 19.0–186.0)
Retic Ct Pct: 3.6 % — ABNORMAL HIGH (ref 0.4–3.1)

## 2021-09-08 MED ORDER — DIPHENHYDRAMINE HCL 25 MG PO CAPS: 25.0000 mg | ORAL_CAPSULE | ORAL | Status: DC | PRN

## 2021-09-08 MED ORDER — SODIUM CHLORIDE 0.45 % IV SOLN: INTRAVENOUS | Status: DC

## 2021-09-08 MED ORDER — KETOROLAC TROMETHAMINE 30 MG/ML IJ SOLN
15.0000 mg | Freq: Once | INTRAMUSCULAR | Status: AC
  Administered 2021-09-08: 15 mg via INTRAVENOUS
  Filled 2021-09-08: qty 1

## 2021-09-08 MED ORDER — ACETAMINOPHEN 500 MG PO TABS
1000.0000 mg | ORAL_TABLET | Freq: Once | ORAL | Status: AC
  Administered 2021-09-08: 1000 mg via ORAL
  Filled 2021-09-08: qty 2

## 2021-09-08 MED ORDER — HYDROMORPHONE 1 MG/ML IV SOLN
INTRAVENOUS | Status: DC
  Administered 2021-09-08: 7 mg via INTRAVENOUS
  Administered 2021-09-08: 30 mg via INTRAVENOUS
  Filled 2021-09-08: qty 30

## 2021-09-08 MED ORDER — SODIUM CHLORIDE 0.9% FLUSH: 9.0000 mL | INTRAVENOUS | Status: DC | PRN

## 2021-09-08 MED ORDER — ONDANSETRON HCL 4 MG/2ML IJ SOLN: 4.0000 mg | Freq: Four times a day (QID) | INTRAMUSCULAR | Status: DC | PRN

## 2021-09-08 MED ORDER — NALOXONE HCL 0.4 MG/ML IJ SOLN: 0.4000 mg | INTRAMUSCULAR | Status: DC | PRN

## 2021-09-08 NOTE — Telephone Encounter (Signed)
Patient called requesting to come to the day hospital for sickle cell pain. Patient reports left arm pain rated 10/10. Patient reports taking Tylenol for pain with little relief. Patient is out of his Oxycodone and has been out "for a while". COVID-19 screening done and patient denies all symptoms and exposures. Denies fever, chest pain, nausea, vomiting, diarrhea, abdominal pain and priapism. Admits to having transportation without driving self. Patient's wife will transport patient. [place], FNP notified. Patient can come to the day hospital for pain management. Patient also advised to call his PCP and request a refill on his Oxycodone so he can manage his pain when he returns home. Patient advised and expresses an understanding.

## 2021-09-08 NOTE — Discharge Summary (Signed)
[facility] Discharge Summary   Patient ID: [name] MRN: [id number] DOB/AGE: 1971-01-23 50 y.o.  Admit date: 09/08/2021 Discharge date: 09/08/2021  Primary Care Physician:  Gwenyth Bender, MD  Admission Diagnoses:  Active Problems:   Sickle cell pain crisis [facility])   Discharge Medications:  Allergies as of 09/08/2021   No Known Allergies      Medication List     TAKE these medications    acetaminophen 500 MG tablet Commonly known as: TYLENOL Take 1,000 mg by mouth every 4 (four) hours as needed for mild pain.   celecoxib 200 MG capsule Commonly known as: CELEBREX Take 200 mg by mouth 2 (two) times daily.   diphenhydrAMINE 25 MG tablet Commonly known as: BENADRYL Take 25 mg by mouth daily as needed for allergies.   ferrous sulfate 325 (65 FE) MG tablet Take 325 mg by mouth daily with breakfast.   Fish Oil 1000 MG Caps Take 1,000 mg by mouth daily.   folic acid 1 MG tablet Commonly known as: FOLVITE Take 1 mg by mouth daily.   ibuprofen 800 MG tablet Commonly known as: ADVIL Take 800 mg by mouth 3 (three) times daily as needed for pain.   ondansetron 4 MG tablet Commonly known as: ZOFRAN Take 1 tablet (4 mg total) by mouth every 8 (eight) hours as needed for nausea or vomiting.   oxyCODONE-acetaminophen 10-325 MG tablet Commonly known as: PERCOCET Take 1 tablet by mouth every 6 (six) hours as needed for pain.   Vitamin D3 250 MCG (10000 UT) Tabs Take 10,000 Units by mouth daily.   Zinc 50 MG Tabs Take 50 mg by mouth daily.         Consults:  None  Significant Diagnostic Studies:  DG Chest 2 View  Result Date: 08/17/2021 CLINICAL DATA:  Fevers. EXAM: CHEST - 2 VIEW COMPARISON:  08/15/2021 FINDINGS: Heart size and mediastinal contours are normal. No pleural effusion or edema. No airspace opacities identified. Previous left shoulder arthroplasty. IMPRESSION: No active cardiopulmonary abnormalities.  Electronically Signed   By: Signa Kell M.D.   On: 08/17/2021 10:38   DG Chest 2 View  Result Date: 08/15/2021 CLINICAL DATA:  Sickle cell.  Vomiting. EXAM: CHEST - 2 VIEW COMPARISON:  Chest x-ray 04/10/2020. FINDINGS: The heart size and mediastinal contours are within normal limits. Both lungs are clear. Left shoulder arthroplasty is present. IMPRESSION: No active cardiopulmonary disease. Electronically Signed   By: Darliss Cheney M.D.   On: 08/15/2021 20:08   DG Elbow 2 Views Right  Result Date: 08/15/2021 CLINICAL DATA:  Elbow pain. EXAM: RIGHT ELBOW - 2 VIEW COMPARISON:  Right elbow x-ray 11/23/2005. FINDINGS: There is no evidence of fracture, dislocation, or joint effusion. There is no evidence of arthropathy or other focal bone abnormality. Soft tissues are unremarkable. IMPRESSION: Negative. Electronically Signed   By: Darliss Cheney M.D.   On: 08/15/2021 20:09   CT ABDOMEN PELVIS W CONTRAST  Result Date: 08/17/2021 CLINICAL DATA:  Left upper quadrant abdominal pain. History of sickle cell disease. EXAM: CT ABDOMEN AND PELVIS WITH CONTRAST TECHNIQUE: Multidetector CT imaging of the abdomen and pelvis was performed using the standard protocol following bolus administration of intravenous contrast. CONTRAST:  59mL OMNIPAQUE IOHEXOL 350 MG/ML SOLN COMPARISON:  CT 04/10/2020, 04/15/2015 FINDINGS: Lower chest: Mild dependent left basilar scarring or atelectasis. 4 mm subpleural nodule in the posterior right lower lobe is unchanged from 2016, benign. Heart size is normal. Hepatobiliary: Redemonstrated scattered tiny  subcentimeter low-density lesions within the liver, likely benign cysts or hamartomas. No new focal liver abnormality. Contracted gallbladder containing multiple stones. Mild intrahepatic biliary dilatation is similar to prior. Pancreas: Unremarkable. No pancreatic ductal dilatation or surrounding inflammatory changes. Spleen: Spleen is enlarged, similar in size and appearance compared to  prior. Spleen enhances homogeneously without evidence of a splenic infarction. Adrenals/Urinary Tract: Unremarkable adrenal glands. 1.5 cm left renal cyst. Kidneys appear otherwise unremarkable. No renal stone or hydronephrosis. Urinary bladder is unremarkable. Stomach/Bowel: Stomach is within normal limits. Appendix appears normal. Sigmoid diverticulosis. No evidence of bowel wall thickening, distention, or inflammatory changes. Vascular/Lymphatic: No significant vascular findings are present. No enlarged abdominal or pelvic lymph nodes. Reproductive: Prostate is unremarkable. Other: No free fluid. No abdominopelvic fluid collection. No pneumoperitoneum. No abdominal wall hernia. 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 No new or acute osseous findings. IMPRESSION: 1. No acute abdominopelvic findings. 2. Cholelithiasis. 3. Sigmoid diverticulosis without evidence of acute diverticulitis. 4. Splenomegaly. 5. 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 Electronically Signed   By: Duanne Guess D.O.   On: 08/17/2021 10:41    History of present illness: [name] is a 50 year old male with a medical history significant for sickle cell disease that presents with complaints left shoulder pain that is consistent with his typical pain crisis.  Patient states that he has been having increased pain to left shoulder over the past 3 days.  He attributes pain crisis to changes in weather.  He says that pain has been increasing in intensity and uncontrolled on home medications.  He has had to take over-the-counter ibuprofen and Tylenol, he is currently out home opiates and has not contacted his primary care provider.  He denies any headache, blurred vision, urinary symptoms, nausea, vomiting, or diarrhea.  He rates his pain as 9/10 characterized as constant and throbbing.  He has had no sick contacts, recent travel, or known exposure to COVID-19. [facility]  Course: Patient admitted to sickle cell day infusion clinic for management of sickle cell pain crisis. Reviewed all laboratory values, largely consistent with his baseline. Pain managed with IV Dilaudid PCA IV Toradol 15 mg x 1 Tylenol 1000 mg x 1 IV fluids, 0.45% saline at 100 mL/h Pain intensity decreased to 5/10 and patient is requesting discharge home.  He is alert, oriented, and ambulating without any assistance.  He was discharged home in hemodynamically stable condition.  Discharge instructions: Resume all home medications.   Follow up with PCP as previously  scheduled.   Discussed the importance of drinking 64 ounces of water daily, dehydration of red blood cells may lead further sickling.   Avoid all stressors that precipitate sickle cell pain crisis.     The patient was given clear instructions to go to ER or return to medical center if symptoms do not improve, worsen or new problems develop.     Physical Exam at Discharge:  BP 105/69 (BP Location: Right Arm)   Pulse 84   Temp 98.1 F (36.7 C) (Temporal)   Resp 14   SpO2 98%   Physical Exam Constitutional:      Appearance: Normal appearance.  Eyes:     Pupils: Pupils are equal, round, and reactive to light.  Cardiovascular:     Rate and Rhythm: Normal rate and regular rhythm.     Pulses: Normal pulses.  Pulmonary:     Effort: Pulmonary effort is normal.  Abdominal:     General: Abdomen is flat.  Bowel sounds are normal.  Musculoskeletal:        General: Normal range of motion.  Skin:    General: Skin is warm.  Neurological:     General: No focal deficit present.     Mental Status: He is alert. Mental status is at baseline.  Psychiatric:        Mood and Affect: Mood normal.        Behavior: Behavior normal.        Thought Content: Thought content normal.        Judgment: Judgment normal.     Disposition at Discharge: Discharge disposition: 01-Home or Self Care       Discharge  Orders: Discharge Instructions     Discharge patient   Complete by: As directed    Discharge disposition: 01-Home or Self Care   Discharge patient date: 09/08/2021       Condition at Discharge:   Stable  Time spent on Discharge:  Greater than 30 minutes.  Signed: Nolon Nations  APRN, MSN, FNP-C Patient Care [facility] [address] [place], [place] [address] [phone]  09/08/2021, 3:46 PM

## 2021-09-08 NOTE — Progress Notes (Addendum)
Pt admitted to the day hospital for treatment of sickle cell pain crisis. Pt reported pain to L arm rated 10/10. Pt given PO Tylenol, IV toradol , placed on Dilaudid PCA, and hydrated with IV fluids. Pt instructed to call PCP and request pain medication refill, pt verbalized understanding. At discharge patient reported pain a 5/10. Discharge instructions given to pt. Pt alert , oriented and ambulatory at discharge.

## 2021-09-08 NOTE — H&P (Signed)
[facility] History and Physical   Date: 09/08/2021  Patient name: [name] Medical record number: [id number] Date of birth: July 04, 1971 Age: 50 y.o. Gender: male PCP: Gwenyth Bender, MD  Attending physician: Quentin Angst, MD  Chief Complaint: Sickle cell pain  History of Present Illness: [name] is a 50 year old male with a medical history significant for sickle cell disease that presents with complaints left shoulder pain that is consistent with his typical pain crisis.  Patient states that he has been having increased pain to left shoulder over the past 3 days.  He attributes pain crisis to changes in weather.  He says that pain has been increasing in intensity and uncontrolled on home medications.  He has had to take over-the-counter ibuprofen and Tylenol, he is currently out home opiates and has not contacted his primary care provider.  He denies any headache, blurred vision, urinary symptoms, nausea, vomiting, or diarrhea.  He rates his pain as 9/10 characterized as constant and throbbing.  He has had no sick contacts, recent travel, or known exposure to COVID-19.  Meds: Medications Prior to Admission  Medication Sig Dispense Refill Last Dose   acetaminophen (TYLENOL) 500 MG tablet Take 1,000 mg by mouth every 4 (four) hours as needed for mild pain.      celecoxib (CELEBREX) 200 MG capsule Take 200 mg by mouth 2 (two) times daily.      Cholecalciferol (VITAMIN D3) 250 MCG (10000 UT) TABS Take 10,000 Units by mouth daily.      diphenhydrAMINE (BENADRYL) 25 MG tablet Take 25 mg by mouth daily as needed for allergies.      ferrous sulfate 325 (65 FE) MG tablet Take 325 mg by mouth daily with breakfast.      folic acid (FOLVITE) 1 MG tablet Take 1 mg by mouth daily.      ibuprofen (ADVIL) 800 MG tablet Take 800 mg by mouth 3 (three) times daily as needed for pain.      Omega-3 Fatty Acids (FISH OIL) 1000 MG CAPS Take 1,000 mg by mouth  daily.      ondansetron (ZOFRAN) 4 MG tablet Take 1 tablet (4 mg total) by mouth every 8 (eight) hours as needed for nausea or vomiting. 30 tablet 0    oxyCODONE-acetaminophen (PERCOCET) 10-325 MG tablet Take 1 tablet by mouth every 6 (six) hours as needed for pain.      Zinc 50 MG TABS Take 50 mg by mouth daily.       Allergies: Patient has no known allergies. 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-       09-16-2020   Low testosterone    Mental disorder    Mixed restrictive and obstructive lung disease (HCC)    Osteoarthrosis, unspecified whether generalized or localized, unspecified site    Pain in joint, site unspecified    Physical exam, annual 02/18/2010   Sickle cell anemia (HCC)    Tobacco use    Past Surgical History:  Procedure Laterality Date   ELECTROCARDIOGRAM  01/31/2011   ESOPHAGOGASTRODUODENOSCOPY  01/03/2013   Procedure: ESOPHAGOGASTRODUODENOSCOPY (EGD);  Surgeon: Meryl Dare, MD,FACG;  Location: [facility];  Service: Endoscopy;  Laterality: N/A;   EYE SURGERY  Left 09/16/2020   Membrane Peel and Vitrectomy, Dr. Luciana Axe   TOTAL SHOULDER ARTHROPLASTY Left 03/18/2020   Procedure: TOTAL SHOULDER ARTHROPLASTY;  Surgeon: Bjorn Pippin, MD;  Location: [facility];  Service: Orthopedics;  Laterality: Left;   UPPER GASTROINTESTINAL ENDOSCOPY  11/18/2010   No family history on file. Social History   Socioeconomic History   Marital status: Married    Spouse name: Not on file   Number of children: Not on file   Years of education: Not on file   Highest education level: Not on file  Occupational History   Not on file  Tobacco Use   Smoking status: Former   Smokeless tobacco: Never  Vaping Use   Vaping Use: Never used   Substance and Sexual Activity   Alcohol use: Yes    Alcohol/week: 0.0 standard drinks    Comment: OCCASIONAL   Drug use: No   Sexual activity: Never  Other Topics Concern   Not on file  Social History Narrative   Not on file   Social Determinants of Health   Financial Resource Strain: Not on file  Food Insecurity: Not on file  Transportation Needs: Not on file  Physical Activity: Not on file  Stress: Not on file  Social Connections: Not on file  Intimate Partner Violence: Not on file   Review of Systems  Constitutional: Negative.   HENT: Negative.    Eyes: Negative.   Cardiovascular: Negative.   Genitourinary: Negative.   Musculoskeletal:  Positive for joint pain.  Skin: Negative.   Neurological: Negative.   Psychiatric/Behavioral: Negative.     Physical Exam: There were no vitals taken for this visit. Physical Exam Constitutional:      Appearance: Normal appearance.  HENT:     Head: Normocephalic.  Eyes:     Pupils: Pupils are equal, round, and reactive to light.  Cardiovascular:     Rate and Rhythm: Normal rate and regular rhythm.     Pulses: Normal pulses.     Heart sounds: Normal heart sounds.  Pulmonary:     Effort: Pulmonary effort is normal.  Abdominal:     General: Abdomen is flat. Bowel sounds are normal.     Palpations: Abdomen is soft.  Musculoskeletal:        General: Normal range of motion.  Skin:    General: Skin is warm.  Neurological:     General: No focal deficit present.     Mental Status: He is alert. Mental status is at baseline.  Psychiatric:        Mood and Affect: Mood normal.     Lab results: No results found for this or any previous visit (from the past 24 hour(s)).  Imaging results:  No results found.   Assessment & Plan: Patient admitted to sickle cell day infusion center for management of pain crisis.  Patient is opiate tolerant Initiate IV dilaudid PCA. Settings of 0.5, 10-minute lockout, and 3 mg/h IV fluids,  0.45% saline at 100 mL/h Toradol 15 mg IV times one dose Tylenol 1000 mg by mouth times one dose Review CBC with differential, complete metabolic panel, and reticulocytes as results become available. Pain intensity will be reevaluated in context of functioning and relationship to baseline as care progresses If pain intensity remains elevated and/or sudden change in hemodynamic stability transition to inpatient services for higher level of care.    Nolon Nations  APRN, MSN, FNP-C Patient Care [facility] [address] [place]  [place], [place] [address] [phone]     09/08/2021, 11:44 AM

## 2022-01-17 DIAGNOSIS — G894 Chronic pain syndrome: Secondary | ICD-10-CM | POA: Diagnosis not present

## 2022-01-17 DIAGNOSIS — D57219 Sickle-cell/Hb-C disease with crisis, unspecified: Secondary | ICD-10-CM | POA: Diagnosis not present

## 2022-01-17 DIAGNOSIS — M25511 Pain in right shoulder: Secondary | ICD-10-CM | POA: Diagnosis not present

## 2022-01-17 DIAGNOSIS — I10 Essential (primary) hypertension: Secondary | ICD-10-CM | POA: Diagnosis not present

## 2022-03-21 DIAGNOSIS — I10 Essential (primary) hypertension: Secondary | ICD-10-CM | POA: Diagnosis not present

## 2022-03-21 DIAGNOSIS — D57219 Sickle-cell/Hb-C disease with crisis, unspecified: Secondary | ICD-10-CM | POA: Diagnosis not present

## 2022-03-21 DIAGNOSIS — M25511 Pain in right shoulder: Secondary | ICD-10-CM | POA: Diagnosis not present

## 2022-03-21 DIAGNOSIS — G894 Chronic pain syndrome: Secondary | ICD-10-CM | POA: Diagnosis not present

## 2022-05-02 ENCOUNTER — Other Ambulatory Visit: Payer: Self-pay | Admitting: Internal Medicine

## 2022-05-02 ENCOUNTER — Ambulatory Visit
Admission: RE | Admit: 2022-05-02 | Discharge: 2022-05-02 | Disposition: A | Discharge status: Home / Self Care | Payer: BC Managed Care – PPO | Source: Ambulatory Visit | Attending: Internal Medicine | Admitting: Internal Medicine

## 2022-05-02 DIAGNOSIS — M7531 Calcific tendinitis of right shoulder: Secondary | ICD-10-CM | POA: Diagnosis not present

## 2022-05-02 DIAGNOSIS — M19011 Primary osteoarthritis, right shoulder: Secondary | ICD-10-CM | POA: Diagnosis not present

## 2022-05-02 DIAGNOSIS — G894 Chronic pain syndrome: Secondary | ICD-10-CM | POA: Diagnosis not present

## 2022-05-02 DIAGNOSIS — I10 Essential (primary) hypertension: Secondary | ICD-10-CM | POA: Diagnosis not present

## 2022-05-02 DIAGNOSIS — M545 Low back pain, unspecified: Secondary | ICD-10-CM

## 2022-05-02 DIAGNOSIS — R52 Pain, unspecified: Secondary | ICD-10-CM

## 2022-05-02 DIAGNOSIS — M25511 Pain in right shoulder: Secondary | ICD-10-CM | POA: Diagnosis not present

## 2022-05-02 DIAGNOSIS — D57219 Sickle-cell/Hb-C disease with crisis, unspecified: Secondary | ICD-10-CM | POA: Diagnosis not present

## 2022-05-23 ENCOUNTER — Encounter (INDEPENDENT_AMBULATORY_CARE_PROVIDER_SITE_OTHER): Payer: Self-pay

## 2022-05-23 DIAGNOSIS — H2513 Age-related nuclear cataract, bilateral: Secondary | ICD-10-CM | POA: Diagnosis not present

## 2022-05-23 DIAGNOSIS — H5213 Myopia, bilateral: Secondary | ICD-10-CM | POA: Diagnosis not present

## 2022-05-23 DIAGNOSIS — H3523 Other non-diabetic proliferative retinopathy, bilateral: Secondary | ICD-10-CM | POA: Diagnosis not present

## 2022-05-30 ENCOUNTER — Encounter (INDEPENDENT_AMBULATORY_CARE_PROVIDER_SITE_OTHER): Payer: BC Managed Care – PPO | Admitting: Ophthalmology

## 2022-06-02 DIAGNOSIS — M25511 Pain in right shoulder: Secondary | ICD-10-CM | POA: Diagnosis not present

## 2022-06-14 DIAGNOSIS — M25511 Pain in right shoulder: Secondary | ICD-10-CM | POA: Diagnosis not present

## 2022-06-14 DIAGNOSIS — G894 Chronic pain syndrome: Secondary | ICD-10-CM | POA: Diagnosis not present

## 2022-06-14 DIAGNOSIS — D57219 Sickle-cell/Hb-C disease with crisis, unspecified: Secondary | ICD-10-CM | POA: Diagnosis not present

## 2022-06-14 DIAGNOSIS — M25551 Pain in right hip: Secondary | ICD-10-CM | POA: Diagnosis not present

## 2022-06-14 DIAGNOSIS — E559 Vitamin D deficiency, unspecified: Secondary | ICD-10-CM | POA: Diagnosis not present

## 2022-06-14 DIAGNOSIS — Z125 Encounter for screening for malignant neoplasm of prostate: Secondary | ICD-10-CM | POA: Diagnosis not present

## 2022-06-14 DIAGNOSIS — M25522 Pain in left elbow: Secondary | ICD-10-CM | POA: Diagnosis not present

## 2022-06-14 DIAGNOSIS — I10 Essential (primary) hypertension: Secondary | ICD-10-CM | POA: Diagnosis not present

## 2022-07-01 DIAGNOSIS — M25511 Pain in right shoulder: Secondary | ICD-10-CM | POA: Diagnosis not present

## 2022-07-18 ENCOUNTER — Encounter (INDEPENDENT_AMBULATORY_CARE_PROVIDER_SITE_OTHER): Payer: BC Managed Care – PPO | Admitting: Ophthalmology

## 2022-07-28 ENCOUNTER — Encounter (INDEPENDENT_AMBULATORY_CARE_PROVIDER_SITE_OTHER): Payer: BC Managed Care – PPO | Admitting: Ophthalmology

## 2022-08-08 DIAGNOSIS — G894 Chronic pain syndrome: Secondary | ICD-10-CM | POA: Diagnosis not present

## 2022-08-08 DIAGNOSIS — I10 Essential (primary) hypertension: Secondary | ICD-10-CM | POA: Diagnosis not present

## 2022-08-08 DIAGNOSIS — D57219 Sickle-cell/Hb-C disease with crisis, unspecified: Secondary | ICD-10-CM | POA: Diagnosis not present

## 2022-08-08 DIAGNOSIS — E782 Mixed hyperlipidemia: Secondary | ICD-10-CM | POA: Diagnosis not present

## 2022-08-08 DIAGNOSIS — Z8711 Personal history of peptic ulcer disease: Secondary | ICD-10-CM | POA: Diagnosis not present

## 2022-08-09 ENCOUNTER — Ambulatory Visit (INDEPENDENT_AMBULATORY_CARE_PROVIDER_SITE_OTHER): Payer: BC Managed Care – PPO | Admitting: Ophthalmology

## 2022-08-09 ENCOUNTER — Encounter (INDEPENDENT_AMBULATORY_CARE_PROVIDER_SITE_OTHER): Payer: Self-pay | Admitting: Ophthalmology

## 2022-08-09 DIAGNOSIS — H2512 Age-related nuclear cataract, left eye: Secondary | ICD-10-CM | POA: Diagnosis not present

## 2022-08-09 DIAGNOSIS — H4312 Vitreous hemorrhage, left eye: Secondary | ICD-10-CM

## 2022-08-09 DIAGNOSIS — H43822 Vitreomacular adhesion, left eye: Secondary | ICD-10-CM

## 2022-08-09 DIAGNOSIS — H2511 Age-related nuclear cataract, right eye: Secondary | ICD-10-CM

## 2022-08-09 DIAGNOSIS — D571 Sickle-cell disease without crisis: Secondary | ICD-10-CM

## 2022-08-09 DIAGNOSIS — H33102 Unspecified retinoschisis, left eye: Secondary | ICD-10-CM | POA: Diagnosis not present

## 2022-08-09 DIAGNOSIS — H3521 Other non-diabetic proliferative retinopathy, right eye: Secondary | ICD-10-CM

## 2022-08-09 DIAGNOSIS — H3522 Other non-diabetic proliferative retinopathy, left eye: Secondary | ICD-10-CM

## 2022-08-09 NOTE — Assessment & Plan Note (Signed)
Follow-up Dr. Marchelle Gearing as scheduled

## 2022-08-09 NOTE — Assessment & Plan Note (Signed)
Resolved post vitrectomy 

## 2022-08-09 NOTE — Assessment & Plan Note (Signed)
OS doing well.  No progression post vitrectomy of cataract.

## 2022-08-09 NOTE — Assessment & Plan Note (Signed)
Improved OS

## 2022-08-09 NOTE — Assessment & Plan Note (Signed)
Condition controlled

## 2022-08-09 NOTE — Assessment & Plan Note (Signed)
Good peripheral PRP no active NVE

## 2022-08-09 NOTE — Assessment & Plan Note (Signed)
OD, good peripheral PRP no active NVE.  Watch superiorly, some traction but no holes or tears

## 2022-08-09 NOTE — Progress Notes (Signed)
08/09/2022     CHIEF COMPLAINT Patient presents for  Chief Complaint  Patient presents with   Retina Evaluation      HISTORY OF PRESENT ILLNESS: [name] is a 51 y.o. 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      Referring physician: Sallye Lat, MD [address] [place],  [place] [address]  HISTORICAL INFORMATION:   Selected notes from the MEDICAL RECORD NUMBER       CURRENT MEDICATIONS: No current outpatient medications on file. (Ophthalmic Drugs)   No current facility-administered medications for this visit. (Ophthalmic Drugs)   Current Outpatient Medications (Other)  Medication Sig   acetaminophen (TYLENOL) 500 MG tablet Take 1,000 mg by mouth every 4 (four) hours as needed for mild pain.   celecoxib (CELEBREX) 200 MG capsule Take 200 mg by mouth 2 (two) times daily.   Cholecalciferol (VITAMIN D3) 250 MCG (10000 UT) TABS Take 10,000 Units by mouth daily.   diphenhydrAMINE (BENADRYL) 25 MG tablet Take 25 mg by mouth daily as needed for allergies.   ferrous sulfate 325 (65 FE) MG tablet Take 325 mg by mouth daily with breakfast.   folic acid (FOLVITE) 1 MG tablet Take 1 mg by mouth daily.   ibuprofen (ADVIL) 800 MG tablet Take 800 mg by mouth 3 (three) times daily as needed for pain.   Omega-3 Fatty Acids (FISH OIL) 1000 MG CAPS Take 1,000 mg by mouth daily.   ondansetron (ZOFRAN) 4  MG tablet Take 1 tablet (4 mg total) by mouth every 8 (eight) hours as needed for nausea or vomiting.   oxyCODONE-acetaminophen (PERCOCET) 10-325 MG tablet Take 1 tablet by mouth every 6 (six) hours as needed for pain.   Zinc 50 MG TABS Take 50 mg by mouth daily.   No current facility-administered medications for this visit. (Other)      REVIEW OF SYSTEMS: ROS   Negative for: Constitutional, Gastrointestinal, Neurological, Skin, Genitourinary, Musculoskeletal, HENT, Endocrine, Cardiovascular, Eyes, Respiratory, Psychiatric, Allergic/Imm, Heme/Lymph Last edited by Aleene Davidson, CMA on 08/09/2022 10:10 AM.       ALLERGIES No Known Allergies  PAST MEDICAL HISTORY 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-       09-16-2020   Low testosterone    Mental disorder    Mixed restrictive and obstructive lung disease (HCC)    Osteoarthrosis, unspecified whether generalized or localized, unspecified site    Pain in joint, site unspecified    Physical exam, annual 02/18/2010   Sickle cell anemia (HCC)  Tobacco use    Past Surgical History:  Procedure Laterality Date   ELECTROCARDIOGRAM  01/31/2011   ESOPHAGOGASTRODUODENOSCOPY  01/03/2013   Procedure: ESOPHAGOGASTRODUODENOSCOPY (EGD);  Surgeon: Meryl Dare, MD,FACG;  Location: [facility];  Service: Endoscopy;  Laterality: N/A;   EYE SURGERY Left 09/16/2020   Membrane Peel and Vitrectomy, Dr. Luciana Axe   TOTAL SHOULDER ARTHROPLASTY Left 03/18/2020   Procedure: TOTAL SHOULDER ARTHROPLASTY;  Surgeon: Bjorn Pippin, MD;  Location: [facility];  Service: Orthopedics;  Laterality: Left;   UPPER GASTROINTESTINAL ENDOSCOPY  11/18/2010    FAMILY HISTORY No family history on file.  SOCIAL  HISTORY Social History   Tobacco Use   Smoking status: Former   Smokeless tobacco: Never  Building services engineer Use: Never used  Substance Use Topics   Alcohol use: Yes    Alcohol/week: 0.0 standard drinks of alcohol    Comment: OCCASIONAL   Drug use: No         OPHTHALMIC EXAM:  Base Eye Exam     Visual Acuity (ETDRS)       Right Left   Dist [place] 20/20 -1 20/30 +2         Tonometry (Tonopen, 10:16 AM)       Right Left   Pressure 14 15         Pupils       Pupils   Right PERRL   Left PERRL         Visual Fields       Left Right    Full Full         Extraocular Movement       Right Left    Ortho Ortho    -- -- --  --  --  -- -- --   -- -- --  --  --  -- -- --           Neuro/Psych     Oriented x3: Yes   Mood/Affect: Normal         Dilation     Both eyes: 1.0% Mydriacyl, 2.5% Phenylephrine @ 10:11 AM           Slit Lamp and Fundus Exam     External Exam       Right Left   External Normal Normal         Slit Lamp Exam       Right Left   Lids/Lashes Normal Normal   Conjunctiva/Sclera White and quiet White and quiet   Cornea Clear Clear   Anterior Chamber Deep and quiet Deep and quiet   Iris Round and reactive Round and reactive   Lens 1+ Nuclear sclerosis 2+ Nuclear sclerosis, now with [place] central nuclear sclerosis out of proportion to the clear cortex yet not visually significant   Anterior Vitreous Normal Normal         Fundus Exam       Right Left   Posterior Vitreous Normal Clear view, vitrectomized   Disc Normal Normal   C/D Ratio 0.25 0.25   Macula Normal Normal, much less topographic distortion   Vessels Old fibrous and vascular disease, mostly inactive in this right eye temporally and inferiorly, yet with traction at 9:00 with atrophic hole, and areas anteriorly of retinal nonperfusion Hemoglobin  seafan retinopathy present peripherally,  inactive temporally   Periphery Traction retinal  detachment inferiorly and inferotemporally, with a hole, no progression because of posterior PRP Seafan retinopathy similarly present, change in peripheral fibrosis, good  PRP peripherally, no active neovascularization            IMAGING AND PROCEDURES  Imaging and Procedures for 08/09/22  OCT, Retina - OU - Both Eyes       Right Eye Quality was good. Scan locations included subfoveal. Central Foveal Thickness: 282. Progression has been stable. Findings include normal foveal contour.   Left Eye Quality was good. Scan locations included subfoveal. Central Foveal Thickness: 340. Progression has improved. Findings include abnormal foveal contour.   Notes OS, anatomy of the macula vastly improved status post vitrectomy membrane peel release of vitreal macular traction and removal of internal limiting membrane for VMT triggered foveomacular schisis, see preop OCT June 15, 2020     Color Fundus Photography Optos - OU - Both Eyes       Right Eye Progression has been stable. Disc findings include normal observations. Macula : normal observations. Vessels : Neovascularization. Periphery : neovascularization.   Left Eye Progression has improved. Disc findings include normal observations. Macula : normal observations. Vessels : Neovascularization. Periphery : neovascularization.   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  Fibrovascular tufts persistent temporally OD, no traction, no progression  OS status post vitrectomy membrane peel for severe epiretinal membrane, vitreomacular traction syndrome, secondary to nondiabetic proliferative retinopathy, sickle cell hemoglobinopathy.             ASSESSMENT/PLAN:  Nuclear sclerotic cataract of left eye OS doing well.  No progression post vitrectomy of cataract.  Vitreomacular adhesion of left eye Resolved post vitrectomy  Vitreous hemorrhage of left eye (HCC) Resolved post  vitrectomy  Nondiabetic proliferative retinopathy of left eye Good peripheral PRP no active NVE  Macular retinoschisis of left eye Improved OS  Nondiabetic proliferative retinopathy, right OD, good peripheral PRP no active NVE.  Watch superiorly, some traction but no holes or tears  Nuclear sclerotic cataract of right eye Follow-up Dr. Marchelle Gearing as scheduled  Sickle cell anemia (HCC) Condition controlled     ICD-10-CM   1. Macular retinoschisis of left eye  H33.102 OCT, Retina - OU - Both Eyes    Color Fundus Photography Optos - OU - Both Eyes    2. Nuclear sclerotic cataract of left eye  H25.12     3. Vitreomacular adhesion of left eye  H43.822     4. Vitreous hemorrhage of left eye (HCC)  H43.12     5. Nondiabetic proliferative retinopathy of left eye  H35.22     6. Nondiabetic proliferative retinopathy, right  H35.21     7. Nuclear sclerotic cataract of right eye  H25.11     8. Sickle cell disease without crisis (HCC)  D57.1       1.  OS vastly improved macular anatomy by OCT, as well as acuity.  Refractive assistance is allowed now patient to return to 20/20 vision in the left eye.  Moderate NSC change in the left eye, likely to progress in the left eye because of the vitrectomized situation however at this youthful age head has not occurred yet  2.  OD also mild cataract  3.  OU peripheral retinal neovascularization, neither active.  We will continue to monitor OD in the which has some minor traction but no active NVE.  Ophthalmic Meds Ordered this visit:  No orders of the defined types were placed in this encounter.      Return in about 1 year (around 08/10/2023) for COLOR FP, DILATE OU, OCT.  There are no  Patient Instructions on file for this visit.   Explained the diagnoses, plan, and follow up with the patient and they expressed understanding.  Patient expressed understanding of the importance of proper follow up care.   Alford Highland Dejai Schubach M.D. Diseases  & Surgery of the Retina and Vitreous [facility] 08/09/22     Abbreviations: M myopia (nearsighted); A astigmatism; H hyperopia (farsighted); P presbyopia; Mrx spectacle prescription;  CTL contact lenses; OD right eye; OS left eye; OU both eyes  XT exotropia; ET esotropia; PEK punctate epithelial keratitis; PEE punctate epithelial erosions; DES dry eye syndrome; MGD meibomian gland dysfunction; ATs artificial tears; PFAT's preservative free artificial tears; NSC nuclear sclerotic cataract; PSC posterior subcapsular cataract; ERM epi-retinal membrane; PVD posterior vitreous detachment; RD retinal detachment; DM diabetes mellitus; DR diabetic retinopathy; NPDR non-proliferative diabetic retinopathy; PDR proliferative diabetic retinopathy; CSME clinically significant macular edema; DME diabetic macular edema; dbh dot blot hemorrhages; CWS cotton wool spot; POAG primary open angle glaucoma; C/D cup-to-disc ratio; HVF humphrey visual field; GVF goldmann visual field; OCT optical coherence tomography; IOP intraocular pressure; BRVO Branch retinal vein occlusion; CRVO central retinal vein occlusion; CRAO central retinal artery occlusion; BRAO branch retinal artery occlusion; RT retinal tear; SB scleral buckle; PPV pars plana vitrectomy; VH Vitreous hemorrhage; PRP panretinal laser photocoagulation; IVK intravitreal kenalog; VMT vitreomacular traction; MH Macular hole;  NVD neovascularization of the disc; NVE neovascularization elsewhere; AREDS age related eye disease study; ARMD age related macular degeneration; POAG primary open angle glaucoma; EBMD epithelial/anterior basement membrane dystrophy; ACIOL anterior chamber intraocular lens; IOL intraocular lens; PCIOL posterior chamber intraocular lens; Phaco/IOL phacoemulsification with intraocular lens placement; PRK photorefractive keratectomy; LASIK laser assisted in situ keratomileusis; HTN hypertension; DM diabetes mellitus; COPD chronic  obstructive pulmonary disease

## 2022-09-13 DIAGNOSIS — I10 Essential (primary) hypertension: Secondary | ICD-10-CM | POA: Diagnosis not present

## 2022-09-13 DIAGNOSIS — D57219 Sickle-cell/Hb-C disease with crisis, unspecified: Secondary | ICD-10-CM | POA: Diagnosis not present

## 2022-09-13 DIAGNOSIS — G894 Chronic pain syndrome: Secondary | ICD-10-CM | POA: Diagnosis not present

## 2022-09-13 DIAGNOSIS — Z8711 Personal history of peptic ulcer disease: Secondary | ICD-10-CM | POA: Diagnosis not present

## 2022-10-18 DIAGNOSIS — I10 Essential (primary) hypertension: Secondary | ICD-10-CM | POA: Diagnosis not present

## 2022-10-18 DIAGNOSIS — Z0001 Encounter for general adult medical examination with abnormal findings: Secondary | ICD-10-CM | POA: Diagnosis not present

## 2022-10-18 DIAGNOSIS — D57219 Sickle-cell/Hb-C disease with crisis, unspecified: Secondary | ICD-10-CM | POA: Diagnosis not present

## 2022-10-18 DIAGNOSIS — G894 Chronic pain syndrome: Secondary | ICD-10-CM | POA: Diagnosis not present

## 2022-11-14 DIAGNOSIS — D57219 Sickle-cell/Hb-C disease with crisis, unspecified: Secondary | ICD-10-CM | POA: Diagnosis not present

## 2022-11-14 DIAGNOSIS — G894 Chronic pain syndrome: Secondary | ICD-10-CM | POA: Diagnosis not present

## 2022-11-14 DIAGNOSIS — E782 Mixed hyperlipidemia: Secondary | ICD-10-CM | POA: Diagnosis not present

## 2022-11-14 DIAGNOSIS — I10 Essential (primary) hypertension: Secondary | ICD-10-CM | POA: Diagnosis not present

## 2022-11-23 DIAGNOSIS — E782 Mixed hyperlipidemia: Secondary | ICD-10-CM | POA: Diagnosis not present

## 2022-11-23 DIAGNOSIS — Z0001 Encounter for general adult medical examination with abnormal findings: Secondary | ICD-10-CM | POA: Diagnosis not present

## 2022-11-23 DIAGNOSIS — M25511 Pain in right shoulder: Secondary | ICD-10-CM | POA: Diagnosis not present

## 2022-11-23 DIAGNOSIS — Z125 Encounter for screening for malignant neoplasm of prostate: Secondary | ICD-10-CM | POA: Diagnosis not present

## 2022-11-23 DIAGNOSIS — I10 Essential (primary) hypertension: Secondary | ICD-10-CM | POA: Diagnosis not present

## 2023-01-12 DIAGNOSIS — D57219 Sickle-cell/Hb-C disease with crisis, unspecified: Secondary | ICD-10-CM | POA: Diagnosis not present

## 2023-01-12 DIAGNOSIS — G894 Chronic pain syndrome: Secondary | ICD-10-CM | POA: Diagnosis not present

## 2023-01-12 DIAGNOSIS — Z8711 Personal history of peptic ulcer disease: Secondary | ICD-10-CM | POA: Diagnosis not present

## 2023-01-12 DIAGNOSIS — U071 COVID-19: Secondary | ICD-10-CM | POA: Diagnosis not present

## 2023-01-12 DIAGNOSIS — I10 Essential (primary) hypertension: Secondary | ICD-10-CM | POA: Diagnosis not present

## 2023-01-27 ENCOUNTER — Emergency Department (HOSPITAL_COMMUNITY): Payer: BC Managed Care – PPO

## 2023-01-27 ENCOUNTER — Other Ambulatory Visit: Payer: Self-pay

## 2023-01-27 ENCOUNTER — Emergency Department (HOSPITAL_COMMUNITY)
Admission: EM | Admit: 2023-01-27 | Discharge: 2023-01-28 | Disposition: A | Discharge status: Home / Self Care | Payer: BC Managed Care – PPO | Attending: Emergency Medicine | Admitting: Emergency Medicine

## 2023-01-27 ENCOUNTER — Encounter (HOSPITAL_COMMUNITY): Payer: Self-pay

## 2023-01-27 DIAGNOSIS — R112 Nausea with vomiting, unspecified: Secondary | ICD-10-CM | POA: Diagnosis not present

## 2023-01-27 DIAGNOSIS — R109 Unspecified abdominal pain: Secondary | ICD-10-CM | POA: Diagnosis not present

## 2023-01-27 DIAGNOSIS — R079 Chest pain, unspecified: Secondary | ICD-10-CM | POA: Diagnosis not present

## 2023-01-27 DIAGNOSIS — K5730 Diverticulosis of large intestine without perforation or abscess without bleeding: Secondary | ICD-10-CM | POA: Diagnosis not present

## 2023-01-27 DIAGNOSIS — K760 Fatty (change of) liver, not elsewhere classified: Secondary | ICD-10-CM | POA: Diagnosis not present

## 2023-01-27 DIAGNOSIS — R1110 Vomiting, unspecified: Secondary | ICD-10-CM | POA: Diagnosis not present

## 2023-01-27 DIAGNOSIS — R1032 Left lower quadrant pain: Secondary | ICD-10-CM | POA: Diagnosis not present

## 2023-01-27 DIAGNOSIS — R0789 Other chest pain: Secondary | ICD-10-CM | POA: Diagnosis not present

## 2023-01-27 DIAGNOSIS — E876 Hypokalemia: Secondary | ICD-10-CM | POA: Diagnosis not present

## 2023-01-27 DIAGNOSIS — R197 Diarrhea, unspecified: Secondary | ICD-10-CM | POA: Diagnosis not present

## 2023-01-27 LAB — CBC
HCT: 38 % — ABNORMAL LOW (ref 39.0–52.0)
Hemoglobin: 12.5 g/dL — ABNORMAL LOW (ref 13.0–17.0)
MCH: 23.1 pg — ABNORMAL LOW (ref 26.0–34.0)
MCHC: 32.9 g/dL (ref 30.0–36.0)
MCV: 70.4 fL — ABNORMAL LOW (ref 80.0–100.0)
Platelets: 139 10*3/uL — ABNORMAL LOW (ref 150–400)
RBC: 5.4 MIL/uL (ref 4.22–5.81)
RDW: 18.3 % — ABNORMAL HIGH (ref 11.5–15.5)
WBC: 8.4 10*3/uL (ref 4.0–10.5)
nRBC: 0 % (ref 0.0–0.2)

## 2023-01-27 LAB — COMPREHENSIVE METABOLIC PANEL
ALT: 20 U/L (ref 0–44)
AST: 42 U/L — ABNORMAL HIGH (ref 15–41)
Albumin: 4.9 g/dL (ref 3.5–5.0)
Alkaline Phosphatase: 51 U/L (ref 38–126)
Anion gap: 14 (ref 5–15)
BUN: 17 mg/dL (ref 6–20)
CO2: 24 mmol/L (ref 22–32)
Calcium: 9.2 mg/dL (ref 8.9–10.3)
Chloride: 97 mmol/L — ABNORMAL LOW (ref 98–111)
Creatinine, Ser: 1.03 mg/dL (ref 0.61–1.24)
GFR, Estimated: >60 mL/min (ref >60)
Glucose, Bld: 105 mg/dL — ABNORMAL HIGH (ref 70–99)
Potassium: 3.3 mmol/L — ABNORMAL LOW (ref 3.5–5.1)
Sodium: 135 mmol/L (ref 135–145)
Total Bilirubin: 2.7 mg/dL — ABNORMAL HIGH (ref 0.3–1.2)
Total Protein: 8.8 g/dL — ABNORMAL HIGH (ref 6.5–8.1)

## 2023-01-27 LAB — LACTIC ACID, PLASMA: Lactic Acid, Venous: 1.7 mmol/L (ref 0.5–1.9)

## 2023-01-27 LAB — URINALYSIS, ROUTINE W REFLEX MICROSCOPIC
Bacteria, UA: NONE SEEN
Bilirubin Urine: NEGATIVE
Glucose, UA: NEGATIVE mg/dL
Hgb urine dipstick: NEGATIVE
Ketones, ur: 20 mg/dL — AB
Leukocytes,Ua: NEGATIVE
Nitrite: NEGATIVE
Protein, ur: 30 mg/dL — AB
Specific Gravity, Urine: 1.014 (ref 1.005–1.030)
pH: 6 (ref 5.0–8.0)

## 2023-01-27 LAB — LIPASE, BLOOD: Lipase: 44 U/L (ref 11–51)

## 2023-01-27 LAB — GROUP A STREP BY PCR: Group A Strep by PCR: NOT DETECTED

## 2023-01-27 LAB — TROPONIN I (HIGH SENSITIVITY)
Troponin I (High Sensitivity): 3 ng/L (ref <18)
Troponin I (High Sensitivity): 3 ng/L (ref <18)

## 2023-01-27 MED ORDER — ONDANSETRON HCL 4 MG/2ML IJ SOLN
4.0000 mg | INTRAMUSCULAR | Status: DC | PRN
  Filled 2023-01-27: qty 2

## 2023-01-27 MED ORDER — HYDROMORPHONE HCL 1 MG/ML IJ SOLN
1.0000 mg | Freq: Once | INTRAMUSCULAR | Status: AC
  Administered 2023-01-28: 1 mg via INTRAVENOUS
  Filled 2023-01-27: qty 1

## 2023-01-27 MED ORDER — PROCHLORPERAZINE EDISYLATE 10 MG/2ML IJ SOLN
10.0000 mg | Freq: Once | INTRAMUSCULAR | Status: AC
  Administered 2023-01-28: 10 mg via INTRAVENOUS
  Filled 2023-01-27: qty 2

## 2023-01-27 MED ORDER — POTASSIUM CHLORIDE CRYS ER 20 MEQ PO TBCR
40.0000 meq | EXTENDED_RELEASE_TABLET | Freq: Once | ORAL | Status: AC
  Administered 2023-01-27: 40 meq via ORAL
  Filled 2023-01-27: qty 2

## 2023-01-27 MED ORDER — SODIUM CHLORIDE 0.45 % IV SOLN: INTRAVENOUS | Status: DC

## 2023-01-27 MED ORDER — ACETAMINOPHEN 500 MG PO TABS
1000.0000 mg | ORAL_TABLET | Freq: Once | ORAL | Status: AC
  Administered 2023-01-27: 1000 mg via ORAL
  Filled 2023-01-27: qty 2

## 2023-01-27 NOTE — ED Provider Notes (Signed)
Care assumed at 2330.  Patient with history of sickle cell anemia here for evaluation of vomiting, diarrhea as well as some left-sided chest pain.  Care assumed pending labs and reevaluation.  On evaluation patient continues to complain of pain to the left side and he is experiencing persistent vomiting.  Plan to treat with additional medications for symptom management.  After did no medications for pain and nausea patient's pain and nausea have resolved and he does not have any recurrent vomiting.  CT abdomen pelvis is negative for acute abnormality.  Feel he is stable at this point for discharge home with return precautions for uncontrolled vomiting or pain.   Quintella Reichert, MD 01/28/23 [phone]

## 2023-01-27 NOTE — ED Triage Notes (Addendum)
Pt reports L sided chest pain radiating to back and emesis x a few days.

## 2023-01-27 NOTE — ED Notes (Signed)
Attempted lab draw x 2 but unsuccessful. RN made aware.

## 2023-01-27 NOTE — ED Provider Notes (Signed)
[facility] Provider Note   CSN: [id number] Arrival date & time: 01/27/23  1825     History {Add pertinent medical, surgical, social history, OB history to HPI:1} Chief Complaint  Patient presents with   Emesis   Chest Pain    [name] is a 52 y.o. male.  52 year old male with prior medical history as detailed below presents for evaluation.  Patient complains of left-sided abdominal discomfort with associated nausea and vomiting x 2 to 3 days.  Patient denies fevers at home.  Patient denies bowel movement change.  Patient denies chest pain or shortness of breath. He also complains of sore throat.     The history is provided by the patient and medical records.       Home Medications Prior to Admission medications   Medication Sig Start Date End Date Taking? Authorizing Provider  acetaminophen (TYLENOL) 500 MG tablet Take 1,000 mg by mouth every 4 (four) hours as needed for mild pain.    [provider]  celecoxib (CELEBREX) 200 MG capsule Take 200 mg by mouth 2 (two) times daily. 07/26/21   [provider]  Cholecalciferol (VITAMIN D3) 250 MCG (10000 UT) TABS Take 10,000 Units by mouth daily.    [provider]  diphenhydrAMINE (BENADRYL) 25 MG tablet Take 25 mg by mouth daily as needed for allergies.    [provider]  ferrous sulfate 325 (65 FE) MG tablet Take 325 mg by mouth daily with breakfast.    [provider]  folic acid (FOLVITE) 1 MG tablet Take 1 mg by mouth daily. 06/09/21   [provider]  ibuprofen (ADVIL) 800 MG tablet Take 800 mg by mouth 3 (three) times daily as needed for pain. 07/22/21   [provider]  Omega-3 Fatty Acids (FISH OIL) 1000 MG CAPS Take 1,000 mg by mouth daily.    [provider]  ondansetron (ZOFRAN) 4 MG tablet Take 1 tablet (4 mg total) by mouth every 8 (eight) hours as needed for nausea or vomiting.  08/18/21   Dorena Dew, FNP  oxyCODONE-acetaminophen (PERCOCET) 10-325 MG tablet Take 1 tablet by mouth every 6 (six) hours as needed for pain. 01/20/20   [provider]  Zinc 50 MG TABS Take 50 mg by mouth daily.    [provider]      Allergies    Patient has no known allergies.    Review of Systems   Review of Systems  All other systems reviewed and are negative.   Physical Exam Updated Vital Signs BP (!) 159/100   Pulse 85   Temp (!) 100.7 F (38.2 C) (Oral)   Resp 17   Ht '5\' 5"'$  (1.651 m)   Wt 68 kg   SpO2 100%   BMI 24.96 kg/m  Physical Exam Vitals and nursing note reviewed.  Constitutional:      General: He is not in acute distress.    Appearance: Normal appearance. He is well-developed.  HENT:     Head: Normocephalic and atraumatic.     Mouth/Throat:     Mouth: Mucous membranes are dry.  Eyes:     Conjunctiva/sclera: Conjunctivae normal.     Pupils: Pupils are equal, round, and reactive to light.  Cardiovascular:     Rate and Rhythm: Regular rhythm.     Heart sounds: Normal heart sounds.  Pulmonary:     Effort: Pulmonary effort is normal. No respiratory distress.  Breath sounds: Normal breath sounds.  Abdominal:     General: There is no distension.     Palpations: Abdomen is soft.     Tenderness: There is no abdominal tenderness.  Musculoskeletal:        General: No deformity. Normal range of motion.     Cervical back: Normal range of motion and neck supple.  Skin:    General: Skin is warm and dry.  Neurological:     General: No focal deficit present.     Mental Status: He is alert and oriented to person, place, and time.     ED Results / Procedures / Treatments   Labs (all labs ordered are listed, but only abnormal results are displayed) Labs Reviewed  CBC - Abnormal; Notable for the following components:      Result Value   Hemoglobin 12.5 (*)    HCT 38.0 (*)    MCV 70.4 (*)    MCH 23.1 (*)    RDW 18.3 (*)     Platelets 139 (*)    All other components within normal limits  URINALYSIS, ROUTINE W REFLEX MICROSCOPIC - Abnormal; Notable for the following components:   Ketones, ur 20 (*)    Protein, ur 30 (*)    All other components within normal limits  CULTURE, BLOOD (ROUTINE X 2)  CULTURE, BLOOD (ROUTINE X 2)  RESP PANEL BY RT-PCR (RSV, FLU A&B, COVID)  RVPGX2  LIPASE, BLOOD  COMPREHENSIVE METABOLIC PANEL  LACTIC ACID, PLASMA  LACTIC ACID, PLASMA  TROPONIN I (HIGH SENSITIVITY)  TROPONIN I (HIGH SENSITIVITY)    EKG EKG Interpretation  Date/Time:  Friday January 27 2023 18:33:44 EST Ventricular Rate:  94 PR Interval:  131 QRS Duration: 99 QT Interval:  427 QTC Calculation: 534 R Axis:   84 Text Interpretation: Sinus rhythm Abnrm T, consider ischemia, anterolateral lds Prolonged QT interval Confirmed by Dene Gentry [phone]) on 01/27/2023 6:45:16 PM  Radiology DG Chest 2 View  Result Date: 01/27/2023 CLINICAL DATA:  Left side chest pain, vomiting EXAM: CHEST - 2 VIEW COMPARISON:  08/17/2021 FINDINGS: Heart and mediastinal contours are within normal limits. No focal opacities or effusions. No acute bony abnormality. IMPRESSION: No active cardiopulmonary disease. Electronically Signed   By: Rolm Baptise M.D.   On: 01/27/2023 19:20    Procedures Procedures  {Document cardiac monitor, telemetry assessment procedure when appropriate:1}  Medications Ordered in ED Medications  0.45 % sodium chloride infusion (has no administration in time range)  ondansetron (ZOFRAN) injection 4 mg (has no administration in time range)    ED Course/ Medical Decision Making/ A&P   {   Click here for ABCD2, HEART and other calculatorsREFRESH Note before signing :1}                          Medical Decision Making Amount and/or Complexity of Data Reviewed Labs: ordered. Radiology: ordered.  Risk Prescription drug management.    Medical Screen Complete  This patient presented to the ED with  complaint of nausea, vomiting.  This complaint involves an extensive number of treatment options. The initial differential diagnosis includes, but is not limited to, metabolic abnormality, infection, etc.  This presentation is: Acute, Self-Limited, Previously Undiagnosed, Uncertain Prognosis, Complicated, Systemic Symptoms, and Threat to Life/Bodily Function  Patient presenting with complaints of nausea, vomiting.  Patient appears to be mildly dehydrated on exam.  IVF initiated.   Potassium 3.3 - oral potassium administered.  Co morbidities that complicated the patient's evaluation  Hx of Sickle Cell   Additional history obtained:  External records from outside sources obtained and reviewed including prior ED visits and prior Inpatient records.    Lab Tests:  I ordered and personally interpreted labs.  The pertinent results include:  CBC CMP Trop Lactic Acid Lipase UA Strep covid Flu   Imaging Studies ordered:  I ordered imaging studies including Chest XR  I independently visualized and interpreted obtained imaging which showed NAD I agree with the radiologist interpretation.   Cardiac Monitoring:  The patient was maintained on a cardiac monitor.  I personally viewed and interpreted the cardiac monitor which showed an underlying rhythm of: NSR   Medicines ordered:  I ordered medication including ***  for ***  Reevaluation of the patient after these medicines showed that the patient: {resolved/improved/worsened:23923::"improved"}   Problem List / ED Course:  Nausea and vomiting   Reevaluation:  After the interventions noted above, I reevaluated the patient and found that they have: {resolved/improved/worsened:23923::"improved"}   Social Determinants of Health:  ***   Disposition:  After consideration of the diagnostic results and the patients response to treatment, I feel that the patent would benefit from ***.    {Document critical care time  when appropriate:1} {Document review of labs and clinical decision tools ie heart score, Chads2Vasc2 etc:1}  {Document your independent review of radiology images, and any outside records:1} {Document your discussion with family members, caretakers, and with consultants:1} {Document social determinants of health affecting pt's care:1} {Document your decision making why or why not admission, treatments were needed:1} Final Clinical Impression(s) / ED Diagnoses Final diagnoses:  None    Rx / DC Orders ED Discharge Orders     None

## 2023-01-28 ENCOUNTER — Emergency Department (HOSPITAL_COMMUNITY): Payer: BC Managed Care – PPO

## 2023-01-28 DIAGNOSIS — K5730 Diverticulosis of large intestine without perforation or abscess without bleeding: Secondary | ICD-10-CM | POA: Diagnosis not present

## 2023-01-28 DIAGNOSIS — R1032 Left lower quadrant pain: Secondary | ICD-10-CM | POA: Diagnosis not present

## 2023-01-28 DIAGNOSIS — K760 Fatty (change of) liver, not elsewhere classified: Secondary | ICD-10-CM | POA: Diagnosis not present

## 2023-01-28 MED ORDER — SODIUM CHLORIDE (PF) 0.9 % IJ SOLN
INTRAMUSCULAR | Status: AC
  Filled 2023-01-28: qty 50

## 2023-01-28 MED ORDER — ONDANSETRON 4 MG PO TBDP: 4.0000 mg | ORAL_TABLET | Freq: Three times a day (TID) | ORAL | 0 refills | Status: AC | PRN

## 2023-01-28 MED ORDER — IOHEXOL 300 MG/ML  SOLN
100.0000 mL | Freq: Once | INTRAMUSCULAR | Status: AC | PRN
  Administered 2023-01-28: 100 mL via INTRAVENOUS

## 2023-02-01 LAB — CULTURE, BLOOD (ROUTINE X 2): Culture: NO GROWTH

## 2023-03-13 DIAGNOSIS — Z8711 Personal history of peptic ulcer disease: Secondary | ICD-10-CM | POA: Diagnosis not present

## 2023-03-13 DIAGNOSIS — G894 Chronic pain syndrome: Secondary | ICD-10-CM | POA: Diagnosis not present

## 2023-03-13 DIAGNOSIS — I119 Hypertensive heart disease without heart failure: Secondary | ICD-10-CM | POA: Diagnosis not present

## 2023-03-13 DIAGNOSIS — D57219 Sickle-cell/Hb-C disease with crisis, unspecified: Secondary | ICD-10-CM | POA: Diagnosis not present

## 2023-04-20 DIAGNOSIS — G894 Chronic pain syndrome: Secondary | ICD-10-CM | POA: Diagnosis not present

## 2023-04-20 DIAGNOSIS — M79604 Pain in right leg: Secondary | ICD-10-CM | POA: Diagnosis not present

## 2023-04-20 DIAGNOSIS — I119 Hypertensive heart disease without heart failure: Secondary | ICD-10-CM | POA: Diagnosis not present

## 2023-04-20 DIAGNOSIS — D57219 Sickle-cell/Hb-C disease with crisis, unspecified: Secondary | ICD-10-CM | POA: Diagnosis not present

## 2023-04-21 ENCOUNTER — Ambulatory Visit
Admission: RE | Admit: 2023-04-21 | Discharge: 2023-04-21 | Disposition: A | Discharge status: Home / Self Care | Payer: BC Managed Care – PPO | Source: Ambulatory Visit | Attending: Internal Medicine | Admitting: Internal Medicine

## 2023-04-21 ENCOUNTER — Encounter: Payer: Self-pay | Admitting: Internal Medicine

## 2023-04-21 ENCOUNTER — Other Ambulatory Visit: Payer: Self-pay | Admitting: Internal Medicine

## 2023-04-21 DIAGNOSIS — E782 Mixed hyperlipidemia: Secondary | ICD-10-CM | POA: Diagnosis not present

## 2023-04-21 DIAGNOSIS — M79604 Pain in right leg: Secondary | ICD-10-CM

## 2023-04-21 DIAGNOSIS — M79661 Pain in right lower leg: Secondary | ICD-10-CM | POA: Diagnosis not present

## 2023-04-21 DIAGNOSIS — D509 Iron deficiency anemia, unspecified: Secondary | ICD-10-CM | POA: Diagnosis not present

## 2023-04-21 DIAGNOSIS — D57219 Sickle-cell/Hb-C disease with crisis, unspecified: Secondary | ICD-10-CM | POA: Diagnosis not present

## 2023-04-21 DIAGNOSIS — R0602 Shortness of breath: Secondary | ICD-10-CM | POA: Diagnosis not present

## 2023-05-12 DIAGNOSIS — R739 Hyperglycemia, unspecified: Secondary | ICD-10-CM | POA: Diagnosis not present

## 2023-05-12 DIAGNOSIS — D57219 Sickle-cell/Hb-C disease with crisis, unspecified: Secondary | ICD-10-CM | POA: Diagnosis not present

## 2023-05-12 DIAGNOSIS — G894 Chronic pain syndrome: Secondary | ICD-10-CM | POA: Diagnosis not present

## 2023-05-12 DIAGNOSIS — I119 Hypertensive heart disease without heart failure: Secondary | ICD-10-CM | POA: Diagnosis not present

## 2023-05-12 DIAGNOSIS — D732 Chronic congestive splenomegaly: Secondary | ICD-10-CM | POA: Diagnosis not present

## 2023-05-12 DIAGNOSIS — Z8711 Personal history of peptic ulcer disease: Secondary | ICD-10-CM | POA: Diagnosis not present

## 2023-05-12 DIAGNOSIS — D57218 Sickle-cell/hb-c disease with crisis with other specified complication: Secondary | ICD-10-CM | POA: Diagnosis not present

## 2023-05-12 DIAGNOSIS — R7303 Prediabetes: Secondary | ICD-10-CM | POA: Diagnosis not present

## 2023-06-12 DIAGNOSIS — D57219 Sickle-cell/Hb-C disease with crisis, unspecified: Secondary | ICD-10-CM | POA: Diagnosis not present

## 2023-06-12 DIAGNOSIS — D732 Chronic congestive splenomegaly: Secondary | ICD-10-CM | POA: Diagnosis not present

## 2023-06-12 DIAGNOSIS — I119 Hypertensive heart disease without heart failure: Secondary | ICD-10-CM | POA: Diagnosis not present

## 2023-06-12 DIAGNOSIS — D509 Iron deficiency anemia, unspecified: Secondary | ICD-10-CM | POA: Diagnosis not present

## 2023-06-12 DIAGNOSIS — G894 Chronic pain syndrome: Secondary | ICD-10-CM | POA: Diagnosis not present

## 2023-07-25 DIAGNOSIS — G894 Chronic pain syndrome: Secondary | ICD-10-CM | POA: Diagnosis not present

## 2023-07-25 DIAGNOSIS — D732 Chronic congestive splenomegaly: Secondary | ICD-10-CM | POA: Diagnosis not present

## 2023-07-25 DIAGNOSIS — D57219 Sickle-cell/Hb-C disease with crisis, unspecified: Secondary | ICD-10-CM | POA: Diagnosis not present

## 2023-07-25 DIAGNOSIS — I119 Hypertensive heart disease without heart failure: Secondary | ICD-10-CM | POA: Diagnosis not present

## 2023-08-14 ENCOUNTER — Encounter (INDEPENDENT_AMBULATORY_CARE_PROVIDER_SITE_OTHER): Payer: BC Managed Care – PPO | Admitting: Ophthalmology

## 2023-09-07 DIAGNOSIS — I119 Hypertensive heart disease without heart failure: Secondary | ICD-10-CM | POA: Diagnosis not present

## 2023-09-07 DIAGNOSIS — Z8711 Personal history of peptic ulcer disease: Secondary | ICD-10-CM | POA: Diagnosis not present

## 2023-09-07 DIAGNOSIS — D57219 Sickle-cell/Hb-C disease with crisis, unspecified: Secondary | ICD-10-CM | POA: Diagnosis not present

## 2023-09-07 DIAGNOSIS — G894 Chronic pain syndrome: Secondary | ICD-10-CM | POA: Diagnosis not present

## 2023-09-14 DIAGNOSIS — D509 Iron deficiency anemia, unspecified: Secondary | ICD-10-CM | POA: Diagnosis not present

## 2023-09-14 DIAGNOSIS — Z125 Encounter for screening for malignant neoplasm of prostate: Secondary | ICD-10-CM | POA: Diagnosis not present

## 2023-09-14 DIAGNOSIS — D57219 Sickle-cell/Hb-C disease with crisis, unspecified: Secondary | ICD-10-CM | POA: Diagnosis not present

## 2023-09-14 DIAGNOSIS — I119 Hypertensive heart disease without heart failure: Secondary | ICD-10-CM | POA: Diagnosis not present

## 2023-09-14 DIAGNOSIS — E782 Mixed hyperlipidemia: Secondary | ICD-10-CM | POA: Diagnosis not present

## 2023-09-15 ENCOUNTER — Telehealth (HOSPITAL_COMMUNITY): Payer: Self-pay | Admitting: *Deleted

## 2023-09-15 NOTE — Telephone Encounter (Signed)
Patient called requesting to come to the day hospital for sickle cell pain. Patient reports left arm and leg pain rated 8/10. Reports taking Oxycodone and Ibuprofen earlier this morning. Denies fever, chest pain, nausea, vomiting, diarrhea abdominal pain and priapism. Patient called right before 12 noon. [place], FNP notified and advised that it is now too late to take patient. Patient would not have adequate time for treatment. Patient advised and expresses an understanding.

## 2023-10-09 DIAGNOSIS — I119 Hypertensive heart disease without heart failure: Secondary | ICD-10-CM | POA: Diagnosis not present

## 2023-10-09 DIAGNOSIS — D57219 Sickle-cell/Hb-C disease with crisis, unspecified: Secondary | ICD-10-CM | POA: Diagnosis not present

## 2023-10-09 DIAGNOSIS — G894 Chronic pain syndrome: Secondary | ICD-10-CM | POA: Diagnosis not present

## 2023-10-09 DIAGNOSIS — Z0001 Encounter for general adult medical examination with abnormal findings: Secondary | ICD-10-CM | POA: Diagnosis not present

## 2023-11-07 DIAGNOSIS — J0101 Acute recurrent maxillary sinusitis: Secondary | ICD-10-CM | POA: Diagnosis not present

## 2023-11-07 DIAGNOSIS — D57219 Sickle-cell/Hb-C disease with crisis, unspecified: Secondary | ICD-10-CM | POA: Diagnosis not present

## 2023-11-07 DIAGNOSIS — G894 Chronic pain syndrome: Secondary | ICD-10-CM | POA: Diagnosis not present

## 2023-11-07 DIAGNOSIS — I119 Hypertensive heart disease without heart failure: Secondary | ICD-10-CM | POA: Diagnosis not present

## 2023-11-23 DIAGNOSIS — E559 Vitamin D deficiency, unspecified: Secondary | ICD-10-CM | POA: Diagnosis not present

## 2023-11-23 DIAGNOSIS — D57219 Sickle-cell/Hb-C disease with crisis, unspecified: Secondary | ICD-10-CM | POA: Diagnosis not present

## 2023-11-23 DIAGNOSIS — I119 Hypertensive heart disease without heart failure: Secondary | ICD-10-CM | POA: Diagnosis not present

## 2023-11-23 DIAGNOSIS — D509 Iron deficiency anemia, unspecified: Secondary | ICD-10-CM | POA: Diagnosis not present

## 2023-12-12 DIAGNOSIS — G894 Chronic pain syndrome: Secondary | ICD-10-CM | POA: Diagnosis not present

## 2023-12-12 DIAGNOSIS — I119 Hypertensive heart disease without heart failure: Secondary | ICD-10-CM | POA: Diagnosis not present

## 2023-12-12 DIAGNOSIS — J0101 Acute recurrent maxillary sinusitis: Secondary | ICD-10-CM | POA: Diagnosis not present

## 2023-12-12 DIAGNOSIS — D57219 Sickle-cell/Hb-C disease with crisis, unspecified: Secondary | ICD-10-CM | POA: Diagnosis not present

## 2024-01-15 DIAGNOSIS — G894 Chronic pain syndrome: Secondary | ICD-10-CM | POA: Diagnosis not present

## 2024-01-15 DIAGNOSIS — D57219 Sickle-cell/Hb-C disease with crisis, unspecified: Secondary | ICD-10-CM | POA: Diagnosis not present

## 2024-01-15 DIAGNOSIS — J0101 Acute recurrent maxillary sinusitis: Secondary | ICD-10-CM | POA: Diagnosis not present

## 2024-01-15 DIAGNOSIS — I119 Hypertensive heart disease without heart failure: Secondary | ICD-10-CM | POA: Diagnosis not present

## 2024-02-20 DIAGNOSIS — D57219 Sickle-cell/Hb-C disease with crisis, unspecified: Secondary | ICD-10-CM | POA: Diagnosis not present

## 2024-02-20 DIAGNOSIS — H65192 Other acute nonsuppurative otitis media, left ear: Secondary | ICD-10-CM | POA: Diagnosis not present

## 2024-02-20 DIAGNOSIS — I119 Hypertensive heart disease without heart failure: Secondary | ICD-10-CM | POA: Diagnosis not present

## 2024-02-20 DIAGNOSIS — G894 Chronic pain syndrome: Secondary | ICD-10-CM | POA: Diagnosis not present

## 2024-03-04 ENCOUNTER — Ambulatory Visit: Payer: Self-pay

## 2024-03-04 NOTE — Telephone Encounter (Signed)
 NOT [facility] PATIENT

## 2024-03-04 NOTE — Telephone Encounter (Signed)
 Chief Complaint: Left arm and left leg pain Symptoms: Sickle Cell Crisis pain in left arm and left leg Frequency: since Saturday Pertinent Negatives: Patient denies fever, shortness of breath, chest pain Disposition: [x] ED /[] Urgent Care (no appt availability in office) / [] Appointment(In office/virtual)/ []  [facility]/ [] Home Care/ [] Refused Recommended Disposition /[] [facility]/ [x]  Follow-up with PCP Additional Notes: Patient called stating he is having a sickle cell pain crisis flare up in his left arm and left leg since Saturday. Patient states pain is 10/10. This RN offered to [facility] for assistance and told patient that if there was not available assistance that this RN recommends ED for pain management. Patient states "I'll just call my doctor" and ended the call.    Copied from CRM [id number]. Topic: Clinical - Red Word Triage >> Mar 04, 2024  7:44 AM Fonda Kinder J wrote: Red Word that prompted transfer to Nurse Triage: Severe pain from left arm to leg (pt has sickle cell) Reason for Disposition  [1] SEVERE sickle cell pain (e.g., pain episode, sickle cell attack) AND [2] has pain management plan AND [3] NOT better after taking pain medicine per plan  Answer Assessment - Initial Assessment Questions 1. ONSET: "When did the pain begin?"      Saturday  2. LOCATION: "Where does it hurt?"      Left arm and leg leg 3. SEVERITY: "How bad is the pain?"  (e.g., Scale 1-10; mild, moderate, or severe)   - MILD (1-3): doesn't interfere with normal activities    - MODERATE (4-7): interferes with normal activities or awakens from sleep    - SEVERE (8-10): excruciating pain, unable to do any normal activities      10 4. PATTERN: "Is the pain constant?" (e.g., yes, no; constant, intermittent)      Constant  5. CAUSE:  What do you think is causing the pain? Is this pain similar to the acute pain attacks you have had in the past? (e.g., similar  location, quality, intensity)     Similar to past pain attacks 6. PAIN MEDICINES:    - "Do you have a plan from your doctor for treating your acute pain episodes?" (e.g., pain management plan)   - "What have you taken so far for the pain?" (e.g., nothing, acetaminophen, NSAIDS, narcotic pain medicine).    - "How much has this helped relieve the pain?"    - "Have you done anything else to help the pain?" (e.g., heating pad)     Patient states he has ibuprofen (not helping at all), pain cream 7. OTHER SYMPTOMS: "Do you have any other symptoms?" (e.g., fever, chest pain, shortness of breath, new areas of swelling or redness)     No  Protocols used: Sickle Cell Disease - Acute Pain Episode (Crisis)-A-AH

## 2024-03-11 DIAGNOSIS — D57219 Sickle-cell/Hb-C disease with crisis, unspecified: Secondary | ICD-10-CM | POA: Diagnosis not present

## 2024-03-11 DIAGNOSIS — I119 Hypertensive heart disease without heart failure: Secondary | ICD-10-CM | POA: Diagnosis not present

## 2024-03-11 DIAGNOSIS — D732 Chronic congestive splenomegaly: Secondary | ICD-10-CM | POA: Diagnosis not present

## 2024-03-11 DIAGNOSIS — G894 Chronic pain syndrome: Secondary | ICD-10-CM | POA: Diagnosis not present

## 2024-03-13 ENCOUNTER — Non-Acute Institutional Stay (HOSPITAL_COMMUNITY)
Admission: AD | Admit: 2024-03-13 | Discharge: 2024-03-13 | Disposition: A | Discharge status: Home / Self Care | Source: Ambulatory Visit | Attending: Internal Medicine | Admitting: Internal Medicine

## 2024-03-13 ENCOUNTER — Telehealth (HOSPITAL_COMMUNITY): Payer: Self-pay

## 2024-03-13 DIAGNOSIS — D5700 Hb-SS disease with crisis, unspecified: Secondary | ICD-10-CM | POA: Diagnosis not present

## 2024-03-13 DIAGNOSIS — Z79899 Other long term (current) drug therapy: Secondary | ICD-10-CM | POA: Diagnosis not present

## 2024-03-13 DIAGNOSIS — H2511 Age-related nuclear cataract, right eye: Secondary | ICD-10-CM

## 2024-03-13 LAB — COMPREHENSIVE METABOLIC PANEL WITH GFR
ALT: 15 U/L (ref 0–44)
AST: 20 U/L (ref 15–41)
Albumin: 5 g/dL (ref 3.5–5.0)
Alkaline Phosphatase: 60 U/L (ref 38–126)
Anion gap: 11 (ref 5–15)
BUN: 19 mg/dL (ref 6–20)
CO2: 25 mmol/L (ref 22–32)
Calcium: 9.9 mg/dL (ref 8.9–10.3)
Chloride: 103 mmol/L (ref 98–111)
Creatinine, Ser: 1.12 mg/dL (ref 0.61–1.24)
GFR, Estimated: >60 mL/min (ref >60)
Glucose, Bld: 121 mg/dL — ABNORMAL HIGH (ref 70–99)
Potassium: 3.9 mmol/L (ref 3.5–5.1)
Sodium: 139 mmol/L (ref 135–145)
Total Bilirubin: 1.6 mg/dL — ABNORMAL HIGH (ref 0.0–1.2)
Total Protein: 8.8 g/dL — ABNORMAL HIGH (ref 6.5–8.1)

## 2024-03-13 LAB — CBC WITH DIFFERENTIAL/PLATELET
Abs Immature Granulocytes: 0.04 10*3/uL (ref 0.00–0.07)
Basophils Absolute: 0 10*3/uL (ref 0.0–0.1)
Basophils Relative: 0 %
Eosinophils Absolute: 0 10*3/uL (ref 0.0–0.5)
Eosinophils Relative: 0 %
HCT: 36.3 % — ABNORMAL LOW (ref 39.0–52.0)
Hemoglobin: 12.7 g/dL — ABNORMAL LOW (ref 13.0–17.0)
Immature Granulocytes: 1 %
Lymphocytes Relative: 11 %
Lymphs Abs: 0.8 10*3/uL (ref 0.7–4.0)
MCH: 27 pg (ref 26.0–34.0)
MCHC: 35 g/dL (ref 30.0–36.0)
MCV: 77.2 fL — ABNORMAL LOW (ref 80.0–100.0)
Monocytes Absolute: 0.5 10*3/uL (ref 0.1–1.0)
Monocytes Relative: 7 %
Neutro Abs: 5.7 10*3/uL (ref 1.7–7.7)
Neutrophils Relative %: 81 %
Platelets: 225 10*3/uL (ref 150–400)
RBC: 4.7 MIL/uL (ref 4.22–5.81)
RDW: 16.1 % — ABNORMAL HIGH (ref 11.5–15.5)
WBC: 7 10*3/uL (ref 4.0–10.5)
nRBC: 0 % (ref 0.0–0.2)

## 2024-03-13 LAB — RETICULOCYTES
Immature Retic Fract: 22.7 % — ABNORMAL HIGH (ref 2.3–15.9)
RBC.: 4.61 MIL/uL (ref 4.22–5.81)
Retic Count, Absolute: 176.6 10*3/uL (ref 19.0–186.0)
Retic Ct Pct: 3.8 % — ABNORMAL HIGH (ref 0.4–3.1)

## 2024-03-13 LAB — LACTATE DEHYDROGENASE: LDH: 193 U/L — ABNORMAL HIGH (ref 98–192)

## 2024-03-13 MED ORDER — ONDANSETRON HCL 4 MG/2ML IJ SOLN
4.0000 mg | Freq: Four times a day (QID) | INTRAMUSCULAR | Status: DC | PRN
  Filled 2024-03-13: qty 2

## 2024-03-13 MED ORDER — DIPHENHYDRAMINE HCL 25 MG PO CAPS
25.0000 mg | ORAL_CAPSULE | ORAL | Status: DC | PRN
  Filled 2024-03-13: qty 1

## 2024-03-13 MED ORDER — NALOXONE HCL 0.4 MG/ML IJ SOLN: 0.4000 mg | INTRAMUSCULAR | Status: DC | PRN

## 2024-03-13 MED ORDER — POLYETHYLENE GLYCOL 3350 17 G PO PACK: 17.0000 g | PACK | Freq: Every day | ORAL | Status: DC | PRN

## 2024-03-13 MED ORDER — HYDROMORPHONE 1 MG/ML IV SOLN
INTRAVENOUS | Status: DC
  Administered 2024-03-13: 10 mg via INTRAVENOUS
  Administered 2024-03-13: 30 mg via INTRAVENOUS
  Filled 2024-03-13: qty 30

## 2024-03-13 MED ORDER — SODIUM CHLORIDE 0.9% FLUSH: 9.0000 mL | INTRAVENOUS | Status: DC | PRN

## 2024-03-13 MED ORDER — SENNOSIDES-DOCUSATE SODIUM 8.6-50 MG PO TABS
1.0000 | ORAL_TABLET | Freq: Two times a day (BID) | ORAL | Status: DC
  Administered 2024-03-13: 1 via ORAL
  Filled 2024-03-13: qty 1

## 2024-03-13 MED ORDER — SODIUM CHLORIDE 0.45 % IV SOLN: INTRAVENOUS | Status: DC

## 2024-03-13 NOTE — Telephone Encounter (Signed)
 Patient arrived in lobby and triaged by RN staff.   Complains of L sided arm bilateral and leg bilateral pain  rates 10/10. Denied chest pain, abd pain, fever, N/V/D, or priapism. Wants to come in for treatment. Last took Oxycodone at 11pm yesterday. Denies recent ED visits. Pt states he has transportation home other than himself today. Onyeje FNP made aware, approved pt to be seen in the day hospital today. Pt notified in lobby that he can be seen in the day hospital, verbalized understanding.

## 2024-03-13 NOTE — H&P (Signed)
 [facility] History and Physical  [name] ZOX:[id number] DOB: Oct 03, 1971 DOA: 03/13/2024  PCP: Gwenyth Bender, MD   Chief Complaint:  Chief Complaint  Patient presents with   Sickle Cell Pain Crisis    HPI: [name] is a 53 y.o. male with history of sickle cell disease,that presents with complaints left shoulder pain that is consistent with his typical pain crisis. Patient states that he has been having increased pain to left shoulder over the past 3 days. He attributes pain crisis to changes in weather. He says that pain has been increasing in intensity and uncontrolled on home medications. He has had to take over-the-counter ibuprofen and Tylenol,. He denies any headache, blurred vision, urinary symptoms, nausea, vomiting, or diarrhea. He rates his pain as 9/10 characterized as constant and throbbing. He has had no sick contacts, or recent travel.  Systemic Review: General: The patient denies anorexia, fever, weight loss Cardiac: Denies chest pain, syncope, palpitations, pedal edema  Respiratory: Denies cough, shortness of breath, wheezing GI: Denies severe indigestion/heartburn, abdominal pain, nausea, vomiting, diarrhea and constipation GU: Denies hematuria, incontinence, dysuria  Musculoskeletal: Left shoulder pain/left side pain  Skin: Denies suspicious skin lesions Neurologic: Denies focal weakness or numbness, change in vision  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-       09-16-2020   Low testosterone    Mental disorder    Mixed restrictive and obstructive lung disease (HCC)    Osteoarthrosis, unspecified whether generalized or localized, unspecified site    Pain  in joint, site unspecified    Physical exam, annual 02/18/2010   Sickle cell anemia (HCC)    Tobacco use     Past Surgical History:  Procedure Laterality Date   ELECTROCARDIOGRAM  01/31/2011   ESOPHAGOGASTRODUODENOSCOPY  01/03/2013   Procedure: ESOPHAGOGASTRODUODENOSCOPY (EGD);  Surgeon: Meryl Dare, MD,FACG;  Location: [facility];  Service: Endoscopy;  Laterality: N/A;   EYE SURGERY Left 09/16/2020   Membrane Peel and Vitrectomy, Dr. Luciana Axe   TOTAL SHOULDER ARTHROPLASTY Left 03/18/2020   Procedure: TOTAL SHOULDER ARTHROPLASTY;  Surgeon: Bjorn Pippin, MD;  Location: [facility];  Service: Orthopedics;  Laterality: Left;   UPPER GASTROINTESTINAL ENDOSCOPY  11/18/2010    No Known Allergies  No family history on file.    Prior to Admission medications   Medication Sig Start Date End Date Taking? Authorizing Provider  acetaminophen (TYLENOL) 500 MG tablet Take 1,000 mg by mouth every 4 (four) hours as needed for mild pain.    [provider]  celecoxib (CELEBREX) 200 MG capsule Take 200 mg by mouth 2 (two) times daily. 07/26/21   [provider]  Cholecalciferol (VITAMIN D3) 250 MCG (10000 UT) TABS Take 10,000 Units by mouth daily.    [provider]  diphenhydrAMINE (BENADRYL) 25 MG tablet Take 25 mg by mouth daily as needed for allergies.    [provider]  ferrous sulfate 325 (65 FE) MG tablet Take 325 mg by mouth daily with breakfast.    [provider]  folic acid (FOLVITE) 1 MG tablet Take 1 mg by mouth daily. 06/09/21   [provider]  ibuprofen (ADVIL) 800 MG tablet Take 800 mg by mouth 3 (  three) times daily as needed for pain. 07/22/21   [provider]  Omega-3 Fatty Acids (FISH OIL) 1000 MG CAPS Take 1,000 mg by mouth daily.    [provider]  ondansetron (ZOFRAN) 4 MG tablet Take 1 tablet (4 mg total) by mouth every 8 (eight) hours as needed for nausea or vomiting. 08/18/21   Massie Maroon, FNP   ondansetron (ZOFRAN-ODT) 4 MG disintegrating tablet Take 1 tablet (4 mg total) by mouth every 8 (eight) hours as needed for nausea or vomiting. 01/28/23   Tilden Fossa, MD  oxyCODONE-acetaminophen (PERCOCET) 10-325 MG tablet Take 1 tablet by mouth every 6 (six) hours as needed for pain. 01/20/20   [provider]  Zinc 50 MG TABS Take 50 mg by mouth daily.    [provider]     Physical Exam: There were no vitals filed for this visit.  General: Alert, awake, afebrile, anicteric, not in obvious distress HEENT: Normocephalic and Atraumatic, Mucous membranes pink                PERRLA; EOM intact; No scleral icterus,                 Nares: Patent, Oropharynx: Clear, Fair Dentition                 Neck: FROM, no cervical lymphadenopathy, thyromegaly, carotid bruit or JVD;  CHEST WALL: No tenderness  CHEST: Normal respiration, clear to auscultation bilaterally  HEART: Regular rate and rhythm; no murmurs rubs or gallops  BACK: No kyphosis or scoliosis; no CVA tenderness  ABDOMEN: Positive Bowel Sounds, soft, non-tender; no masses, no organomegaly EXTREMITIES: No cyanosis, clubbing, or edema SKIN:  no rash or ulceration  CNS: Alert and Oriented x 4, Nonfocal exam, CN 2-12 intact  Labs on Admission:  Basic Metabolic Panel: No results for input(s): "NA", "K", "CL", "CO2", "GLUCOSE", "BUN", "CREATININE", "CALCIUM", "MG", "PHOS" in the last 168 hours. Liver Function Tests: No results for input(s): "AST", "ALT", "ALKPHOS", "BILITOT", "PROT", "ALBUMIN" in the last 168 hours. No results for input(s): "LIPASE", "AMYLASE" in the last 168 hours. No results for input(s): "AMMONIA" in the last 168 hours. CBC: No results for input(s): "WBC", "NEUTROABS", "HGB", "HCT", "MCV", "PLT" in the last 168 hours. Cardiac Enzymes: No results for input(s): "CKTOTAL", "CKMB", "CKMBINDEX", "TROPONINI" in the last 168 hours.  BNP (last 3 results) No results for input(s): "BNP" in the last 8760  hours.  ProBNP (last 3 results) No results for input(s): "PROBNP" in the last 8760 hours.  CBG: No results for input(s): "GLUCAP" in the last 168 hours.   Assessment/Plan Active Problems:   * No active hospital problems. *  Admits to the Day Hospital for extended observation IVF 0 point 45% Saline @ 125 mls/hour Weight based Dilaudid PCA started within 30 minutes of admission IV Toradol 15 mg x 1 doses Acetaminophen 1000 mg x 1 dose Labs: CBCD, CMP, Retic Count and LDH Monitor vitals very closely, Re-evaluate pain scale every hour 2 L of Oxygen by [place] Patient will be re-evaluated for pain in the context of function and relationship to baseline as care progresses. If no significant relieve from pain (remains above 5/10) will transfer patient to inpatient services for further evaluation and management  Code Status: Full  Family Communication: None  DVT Prophylaxis: Ambulate as tolerated   Time spent: 35 Minutes  Daryll Drown NP  If 7PM-7AM, please contact night-coverage [email or web address] 03/13/2024, 8:53 AM

## 2024-03-13 NOTE — Progress Notes (Signed)
 Pt admitted to the day hospital by Marylu Soda FNP for treatment of sickle cell pain crisis. Pt reported pain to L leg and L arm rated a 10/10. PIV established by IV team.  Pt placed on Dilaudid PCA .5/10/3, and hydrated with 1/2 NS IV fluids. At discharge patient reported pain a 6/10. Pt provided with AVS. Pt states he spoke with his PCP Dr. Rozelle Corning and requested refill of pain medications.  Pt alert , oriented and ambulatory at discharge.

## 2024-03-13 NOTE — Discharge Summary (Signed)
 Physician Discharge Summary  [name] DOA: 03/13/2024  PCP: Ferrell Hu, MD  Admit date: 03/13/2024  Discharge date: 03/13/2024  Time spent: 30 minutes  Discharge Diagnoses:  Principal Problem:   Sickle cell anemia with pain [facility])   Discharge Condition: Stable  Diet recommendation: Regular  History of present illness:  [name] is a 53 y.o. male with history of sickle cell disease,that presents with complaints left shoulder pain that is consistent with his typical pain crisis. Patient states that he has been having increased pain to left shoulder over the past 3 days. He attributes pain crisis to changes in weather. He says that pain has been increasing in intensity and uncontrolled on home medications. He has had to take over-the-counter ibuprofen and Tylenol,. He denies any headache, blurred vision, urinary symptoms, nausea, vomiting, or diarrhea. He rates his pain as 9/10 characterized as constant and throbbing. He has had no sick contacts, or recent travel.  Hospital Course:  [name] was admitted to the day hospital with sickle cell painful crisis. Patient was treated with IV fluid, weight based IV Dilaudid PCA, IV Toradol, clinician assisted doses as deemed appropriate, and other adjunct therapies per sickle cell pain management protocol. [name] showed significant improvement symptomatically, pain improved from 9/10 to 5/10 at the time of discharge. Patient was discharged home in a hemodynamically stable condition. [name] will follow-up at the clinic as previously scheduled, continue with home medications as per prior to admission.  Discharge Instructions We discussed the need for good hydration, monitoring of hydration status, avoidance of heat, cold, stress, and infection triggers. We discussed the need to be compliant with taking Hydrea and other home medications. [name] was  reminded of the need to seek medical attention immediately if any symptom of bleeding, anemia, or infection occurs.  Discharge Exam: Vitals:   03/13/24 1329 03/13/24 1534  BP: 126/61 113/76  Pulse: 84 92  Resp: 13 14  Temp: 98.7 F (37.1 C) 98.2 F (36.8 C)  SpO2: 97% 96%    General appearance: alert, cooperative and no distress Eyes: conjunctivae/corneas clear. PERRL, EOM's intact. Fundi benign. Neck: no adenopathy, no carotid bruit, no JVD, supple, symmetrical, trachea midline and thyroid not enlarged, symmetric, no tenderness/mass/nodules Back: symmetric, no curvature. ROM normal. No CVA tenderness. Resp: clear to auscultation bilaterally Chest wall: no tenderness Cardio: regular rate and rhythm, S1, S2 normal, no murmur, click, rub or gallop GI: soft, non-tender; bowel sounds normal; no masses,  no organomegaly Extremities: extremities normal, atraumatic, no cyanosis or edema Pulses: 2+ and symmetric Skin: Skin color, texture, turgor normal. No rashes or lesions Neurologic: Grossly normal  Discharge Instructions     Call MD for:  severe uncontrolled pain   Complete by: As directed    Call MD for:  temperature >100.4   Complete by: As directed    Diet - low sodium heart healthy   Complete by: As directed    Increase activity slowly   Complete by: As directed       Allergies as of 03/13/2024   No Known Allergies      Medication List     TAKE these medications    acetaminophen 500 MG tablet Commonly known as: TYLENOL Take 1,000 mg by mouth every 4 (four) hours as needed for mild pain.   celecoxib 200 MG capsule Commonly known as: CELEBREX Take 200 mg by mouth 2 (two) times daily.   diphenhydrAMINE 25 MG tablet  Commonly known as: BENADRYL Take 25 mg by mouth daily as needed for allergies.   ferrous sulfate 325 (65 FE) MG tablet Take 325 mg by mouth daily with breakfast.   Fish Oil 1000 MG Caps Take 1,000 mg by mouth daily.   folic acid 1 MG  tablet Commonly known as: FOLVITE Take 1 mg by mouth daily.   ibuprofen 800 MG tablet Commonly known as: ADVIL Take 800 mg by mouth 3 (three) times daily as needed for pain.   ondansetron 4 MG disintegrating tablet Commonly known as: ZOFRAN-ODT Take 1 tablet (4 mg total) by mouth every 8 (eight) hours as needed for nausea or vomiting.   ondansetron 4 MG tablet Commonly known as: ZOFRAN Take 1 tablet (4 mg total) by mouth every 8 (eight) hours as needed for nausea or vomiting.   oxyCODONE-acetaminophen 10-325 MG tablet Commonly known as: PERCOCET Take 1 tablet by mouth every 6 (six) hours as needed for pain.   Vitamin D3 250 MCG (10000 UT) Tabs Take 10,000 Units by mouth daily.   Zinc 50 MG Tabs Take 50 mg by mouth daily.       No Known Allergies   Significant Diagnostic Studies: No results found.  Signed:  Lorel Roes NP  03/13/2024, 3:55 PM

## 2024-04-08 DIAGNOSIS — D732 Chronic congestive splenomegaly: Secondary | ICD-10-CM | POA: Diagnosis not present

## 2024-04-08 DIAGNOSIS — I119 Hypertensive heart disease without heart failure: Secondary | ICD-10-CM | POA: Diagnosis not present

## 2024-04-08 DIAGNOSIS — G894 Chronic pain syndrome: Secondary | ICD-10-CM | POA: Diagnosis not present

## 2024-04-08 DIAGNOSIS — D57219 Sickle-cell/Hb-C disease with crisis, unspecified: Secondary | ICD-10-CM | POA: Diagnosis not present

## 2024-05-13 DIAGNOSIS — D732 Chronic congestive splenomegaly: Secondary | ICD-10-CM | POA: Diagnosis not present

## 2024-05-13 DIAGNOSIS — G894 Chronic pain syndrome: Secondary | ICD-10-CM | POA: Diagnosis not present

## 2024-05-13 DIAGNOSIS — I119 Hypertensive heart disease without heart failure: Secondary | ICD-10-CM | POA: Diagnosis not present

## 2024-05-13 DIAGNOSIS — D57219 Sickle-cell/Hb-C disease with crisis, unspecified: Secondary | ICD-10-CM | POA: Diagnosis not present

## 2024-06-10 DIAGNOSIS — G894 Chronic pain syndrome: Secondary | ICD-10-CM | POA: Diagnosis not present

## 2024-06-10 DIAGNOSIS — D732 Chronic congestive splenomegaly: Secondary | ICD-10-CM | POA: Diagnosis not present

## 2024-06-10 DIAGNOSIS — D57219 Sickle-cell/Hb-C disease with crisis, unspecified: Secondary | ICD-10-CM | POA: Diagnosis not present

## 2024-06-10 DIAGNOSIS — I119 Hypertensive heart disease without heart failure: Secondary | ICD-10-CM | POA: Diagnosis not present

## 2024-07-22 DIAGNOSIS — Z23 Encounter for immunization: Secondary | ICD-10-CM | POA: Diagnosis not present

## 2024-07-22 DIAGNOSIS — D57219 Sickle-cell/Hb-C disease with crisis, unspecified: Secondary | ICD-10-CM | POA: Diagnosis not present

## 2024-07-22 DIAGNOSIS — G894 Chronic pain syndrome: Secondary | ICD-10-CM | POA: Diagnosis not present

## 2024-07-22 DIAGNOSIS — I119 Hypertensive heart disease without heart failure: Secondary | ICD-10-CM | POA: Diagnosis not present

## 2024-07-22 DIAGNOSIS — D732 Chronic congestive splenomegaly: Secondary | ICD-10-CM | POA: Diagnosis not present

## 2024-09-02 DIAGNOSIS — D732 Chronic congestive splenomegaly: Secondary | ICD-10-CM | POA: Diagnosis not present

## 2024-09-02 DIAGNOSIS — G894 Chronic pain syndrome: Secondary | ICD-10-CM | POA: Diagnosis not present

## 2024-09-02 DIAGNOSIS — I119 Hypertensive heart disease without heart failure: Secondary | ICD-10-CM | POA: Diagnosis not present

## 2024-09-02 DIAGNOSIS — D57219 Sickle-cell/Hb-C disease with crisis, unspecified: Secondary | ICD-10-CM | POA: Diagnosis not present

## 2024-09-17 DIAGNOSIS — D509 Iron deficiency anemia, unspecified: Secondary | ICD-10-CM | POA: Diagnosis not present

## 2024-09-17 DIAGNOSIS — E782 Mixed hyperlipidemia: Secondary | ICD-10-CM | POA: Diagnosis not present

## 2024-09-17 DIAGNOSIS — I119 Hypertensive heart disease without heart failure: Secondary | ICD-10-CM | POA: Diagnosis not present

## 2024-09-30 DIAGNOSIS — I119 Hypertensive heart disease without heart failure: Secondary | ICD-10-CM | POA: Diagnosis not present

## 2024-09-30 DIAGNOSIS — D732 Chronic congestive splenomegaly: Secondary | ICD-10-CM | POA: Diagnosis not present

## 2024-09-30 DIAGNOSIS — G894 Chronic pain syndrome: Secondary | ICD-10-CM | POA: Diagnosis not present

## 2024-09-30 DIAGNOSIS — D57219 Sickle-cell/Hb-C disease with crisis, unspecified: Secondary | ICD-10-CM | POA: Diagnosis not present

## 2024-10-28 DIAGNOSIS — G894 Chronic pain syndrome: Secondary | ICD-10-CM | POA: Diagnosis not present

## 2024-10-28 DIAGNOSIS — D57219 Sickle-cell/Hb-C disease with crisis, unspecified: Secondary | ICD-10-CM | POA: Diagnosis not present

## 2024-10-28 DIAGNOSIS — I119 Hypertensive heart disease without heart failure: Secondary | ICD-10-CM | POA: Diagnosis not present

## 2024-10-28 DIAGNOSIS — Z0001 Encounter for general adult medical examination with abnormal findings: Secondary | ICD-10-CM | POA: Diagnosis not present
